# Patient Record
Sex: Female | Born: 1950
Health system: Southern US, Community
[De-identification: ages and names within clinical notes are randomized; demographics above are authoritative.]

## PROBLEM LIST (undated history)

## (undated) DIAGNOSIS — E119 Type 2 diabetes mellitus without complications: Secondary | ICD-10-CM

## (undated) DIAGNOSIS — E785 Hyperlipidemia, unspecified: Secondary | ICD-10-CM

## (undated) DIAGNOSIS — Z923 Personal history of irradiation: Secondary | ICD-10-CM

## (undated) DIAGNOSIS — Z803 Family history of malignant neoplasm of breast: Secondary | ICD-10-CM

## (undated) DIAGNOSIS — T7840XA Allergy, unspecified, initial encounter: Secondary | ICD-10-CM

## (undated) DIAGNOSIS — C50919 Malignant neoplasm of unspecified site of unspecified female breast: Secondary | ICD-10-CM

## (undated) HISTORY — DX: Type 2 diabetes mellitus without complications: E11.9

## (undated) HISTORY — PX: BREAST BIOPSY: SHX20

## (undated) HISTORY — DX: Allergy, unspecified, initial encounter: T78.40XA

## (undated) HISTORY — DX: Family history of malignant neoplasm of breast: Z80.3

## (undated) HISTORY — DX: Hyperlipidemia, unspecified: E78.5

## (undated) HISTORY — PX: OOPHORECTOMY: SHX86

## (undated) HISTORY — DX: Malignant neoplasm of unspecified site of unspecified female breast: C50.919

## (undated) HISTORY — PX: TONSILLECTOMY: SUR1361

## (undated) HISTORY — PX: FOOT SURGERY: SHX648

---

## 1998-07-13 ENCOUNTER — Ambulatory Visit (HOSPITAL_COMMUNITY): Admission: RE | Admit: 1998-07-13 | Discharge: 1998-07-13 | Payer: Self-pay | Admitting: Obstetrics and Gynecology

## 1998-07-15 ENCOUNTER — Other Ambulatory Visit: Admission: RE | Admit: 1998-07-15 | Discharge: 1998-07-15 | Payer: Self-pay | Admitting: Obstetrics and Gynecology

## 1999-08-01 ENCOUNTER — Other Ambulatory Visit: Admission: RE | Admit: 1999-08-01 | Discharge: 1999-08-01 | Payer: Self-pay | Admitting: Obstetrics and Gynecology

## 2000-07-31 ENCOUNTER — Other Ambulatory Visit: Admission: RE | Admit: 2000-07-31 | Discharge: 2000-07-31 | Payer: Self-pay | Admitting: Obstetrics and Gynecology

## 2001-07-31 ENCOUNTER — Other Ambulatory Visit: Admission: RE | Admit: 2001-07-31 | Discharge: 2001-07-31 | Payer: Self-pay | Admitting: Obstetrics & Gynecology

## 2002-07-21 ENCOUNTER — Ambulatory Visit (HOSPITAL_COMMUNITY): Admission: RE | Admit: 2002-07-21 | Discharge: 2002-07-21 | Payer: Self-pay | Admitting: Obstetrics and Gynecology

## 2002-07-21 ENCOUNTER — Other Ambulatory Visit: Admission: RE | Admit: 2002-07-21 | Discharge: 2002-07-21 | Payer: Self-pay | Admitting: Obstetrics and Gynecology

## 2002-07-21 ENCOUNTER — Encounter: Payer: Self-pay | Admitting: Obstetrics and Gynecology

## 2003-08-03 ENCOUNTER — Other Ambulatory Visit: Admission: RE | Admit: 2003-08-03 | Discharge: 2003-08-03 | Payer: Self-pay | Admitting: Obstetrics and Gynecology

## 2004-08-03 ENCOUNTER — Other Ambulatory Visit: Admission: RE | Admit: 2004-08-03 | Discharge: 2004-08-03 | Payer: Self-pay | Admitting: Obstetrics and Gynecology

## 2005-08-03 ENCOUNTER — Other Ambulatory Visit: Admission: RE | Admit: 2005-08-03 | Discharge: 2005-08-03 | Payer: Self-pay | Admitting: Obstetrics and Gynecology

## 2006-08-20 ENCOUNTER — Ambulatory Visit: Payer: Self-pay | Admitting: Family Medicine

## 2006-11-03 ENCOUNTER — Encounter: Payer: Self-pay | Admitting: Family Medicine

## 2006-11-03 LAB — CONVERTED CEMR LAB

## 2006-11-15 ENCOUNTER — Ambulatory Visit: Payer: Self-pay | Admitting: Family Medicine

## 2006-11-19 ENCOUNTER — Ambulatory Visit: Payer: Self-pay | Admitting: Family Medicine

## 2006-11-19 ENCOUNTER — Other Ambulatory Visit: Admission: RE | Admit: 2006-11-19 | Discharge: 2006-11-19 | Payer: Self-pay | Admitting: Family Medicine

## 2006-11-19 ENCOUNTER — Encounter (INDEPENDENT_AMBULATORY_CARE_PROVIDER_SITE_OTHER): Payer: Self-pay | Admitting: *Deleted

## 2006-12-04 DIAGNOSIS — C50919 Malignant neoplasm of unspecified site of unspecified female breast: Secondary | ICD-10-CM

## 2006-12-04 DIAGNOSIS — Z923 Personal history of irradiation: Secondary | ICD-10-CM

## 2006-12-04 HISTORY — PX: BREAST LUMPECTOMY: SHX2

## 2006-12-04 HISTORY — DX: Personal history of irradiation: Z92.3

## 2006-12-04 HISTORY — DX: Malignant neoplasm of unspecified site of unspecified female breast: C50.919

## 2007-08-08 ENCOUNTER — Encounter: Payer: Self-pay | Admitting: Family Medicine

## 2007-08-08 DIAGNOSIS — E669 Obesity, unspecified: Secondary | ICD-10-CM | POA: Insufficient documentation

## 2007-08-08 DIAGNOSIS — J309 Allergic rhinitis, unspecified: Secondary | ICD-10-CM | POA: Insufficient documentation

## 2007-08-08 DIAGNOSIS — E1169 Type 2 diabetes mellitus with other specified complication: Secondary | ICD-10-CM | POA: Insufficient documentation

## 2007-08-08 DIAGNOSIS — E785 Hyperlipidemia, unspecified: Secondary | ICD-10-CM | POA: Insufficient documentation

## 2007-08-08 DIAGNOSIS — E6609 Other obesity due to excess calories: Secondary | ICD-10-CM | POA: Insufficient documentation

## 2007-08-19 ENCOUNTER — Encounter: Payer: Self-pay | Admitting: Family Medicine

## 2007-08-19 ENCOUNTER — Ambulatory Visit: Payer: Self-pay | Admitting: Family Medicine

## 2007-08-19 ENCOUNTER — Other Ambulatory Visit: Admission: RE | Admit: 2007-08-19 | Discharge: 2007-08-19 | Payer: Self-pay | Admitting: Family Medicine

## 2007-08-19 LAB — CONVERTED CEMR LAB: Pap Smear: NORMAL

## 2007-08-20 LAB — CONVERTED CEMR LAB
ALT: 21 units/L (ref 0–35)
AST: 22 units/L (ref 0–37)
Albumin: 3.9 g/dL (ref 3.5–5.2)
Alkaline Phosphatase: 81 units/L (ref 39–117)
BUN: 14 mg/dL (ref 6–23)
Bilirubin, Direct: 0.1 mg/dL (ref 0.0–0.3)
CO2: 30 meq/L (ref 19–32)
Calcium: 9.1 mg/dL (ref 8.4–10.5)
Chloride: 108 meq/L (ref 96–112)
Cholesterol: 210 mg/dL (ref 0–200)
Creatinine, Ser: 1 mg/dL (ref 0.4–1.2)
Direct LDL: 133.6 mg/dL
GFR calc Af Amer: 74 mL/min
GFR calc non Af Amer: 61 mL/min
Glucose, Bld: 88 mg/dL (ref 70–99)
HDL: 62.9 mg/dL (ref >39.0)
Potassium: 4.2 meq/L (ref 3.5–5.1)
Sodium: 143 meq/L (ref 135–145)
Total Bilirubin: 1 mg/dL (ref 0.3–1.2)
Total CHOL/HDL Ratio: 3.3
Total Protein: 6.6 g/dL (ref 6.0–8.3)
Triglycerides: 88 mg/dL (ref 0–149)
VLDL: 18 mg/dL (ref 0–40)

## 2007-09-02 ENCOUNTER — Encounter: Payer: Self-pay | Admitting: Family Medicine

## 2007-09-03 ENCOUNTER — Encounter (INDEPENDENT_AMBULATORY_CARE_PROVIDER_SITE_OTHER): Payer: Self-pay | Admitting: *Deleted

## 2007-09-13 ENCOUNTER — Encounter: Payer: Self-pay | Admitting: Family Medicine

## 2007-09-18 ENCOUNTER — Telehealth: Payer: Self-pay | Admitting: Family Medicine

## 2007-09-24 ENCOUNTER — Encounter: Payer: Self-pay | Admitting: Family Medicine

## 2007-09-24 ENCOUNTER — Encounter: Admission: RE | Admit: 2007-09-24 | Discharge: 2007-09-24 | Payer: Self-pay | Admitting: Family Medicine

## 2007-09-24 ENCOUNTER — Encounter (INDEPENDENT_AMBULATORY_CARE_PROVIDER_SITE_OTHER): Payer: Self-pay | Admitting: Radiology

## 2007-09-29 ENCOUNTER — Encounter: Admission: RE | Admit: 2007-09-29 | Discharge: 2007-09-29 | Payer: Self-pay | Admitting: Family Medicine

## 2007-10-01 ENCOUNTER — Encounter: Payer: Self-pay | Admitting: Family Medicine

## 2007-10-03 ENCOUNTER — Ambulatory Visit: Payer: Self-pay | Admitting: Oncology

## 2007-10-15 ENCOUNTER — Encounter: Admission: RE | Admit: 2007-10-15 | Discharge: 2007-10-15 | Payer: Self-pay | Admitting: General Surgery

## 2007-10-16 ENCOUNTER — Ambulatory Visit (HOSPITAL_BASED_OUTPATIENT_CLINIC_OR_DEPARTMENT_OTHER): Admission: RE | Admit: 2007-10-16 | Discharge: 2007-10-16 | Payer: Self-pay | Admitting: General Surgery

## 2007-10-16 ENCOUNTER — Encounter: Payer: Self-pay | Admitting: Family Medicine

## 2007-10-16 ENCOUNTER — Encounter: Admission: RE | Admit: 2007-10-16 | Discharge: 2007-10-16 | Payer: Self-pay | Admitting: General Surgery

## 2007-10-16 ENCOUNTER — Encounter (INDEPENDENT_AMBULATORY_CARE_PROVIDER_SITE_OTHER): Payer: Self-pay | Admitting: General Surgery

## 2007-11-05 ENCOUNTER — Encounter: Payer: Self-pay | Admitting: Family Medicine

## 2007-11-20 ENCOUNTER — Ambulatory Visit: Admission: RE | Admit: 2007-11-20 | Discharge: 2007-12-04 | Payer: Self-pay | Admitting: Radiation Oncology

## 2007-11-21 ENCOUNTER — Encounter: Payer: Self-pay | Admitting: Family Medicine

## 2007-11-21 ENCOUNTER — Ambulatory Visit: Payer: Self-pay | Admitting: Oncology

## 2007-11-21 LAB — CBC WITH DIFFERENTIAL/PLATELET
BASO%: 0.9 % (ref 0.0–2.0)
Basophils Absolute: 0.1 10*3/uL (ref 0.0–0.1)
EOS%: 5.9 % (ref 0.0–7.0)
Eosinophils Absolute: 0.5 10*3/uL (ref 0.0–0.5)
HCT: 42.3 % (ref 34.8–46.6)
HGB: 14.9 g/dL (ref 11.6–15.9)
LYMPH%: 31.8 % (ref 14.0–48.0)
MCH: 31.8 pg (ref 26.0–34.0)
MCHC: 35.3 g/dL (ref 32.0–36.0)
MCV: 89.9 fL (ref 81.0–101.0)
MONO#: 0.6 10*3/uL (ref 0.1–0.9)
MONO%: 7.4 % (ref 0.0–13.0)
NEUT#: 4.5 10*3/uL (ref 1.5–6.5)
NEUT%: 54.1 % (ref 39.6–76.8)
Platelets: 391 10*3/uL (ref 145–400)
RBC: 4.7 10*6/uL (ref 3.70–5.32)
RDW: 10.2 % — ABNORMAL LOW (ref 11.3–14.5)
WBC: 8.3 10*3/uL (ref 3.9–10.0)
lymph#: 2.6 10*3/uL (ref 0.9–3.3)

## 2007-11-21 LAB — COMPREHENSIVE METABOLIC PANEL
ALT: 14 U/L (ref 0–35)
AST: 16 U/L (ref 0–37)
Albumin: 4.3 g/dL (ref 3.5–5.2)
Alkaline Phosphatase: 92 U/L (ref 39–117)
BUN: 22 mg/dL (ref 6–23)
CO2: 30 mEq/L (ref 19–32)
Calcium: 9.5 mg/dL (ref 8.4–10.5)
Chloride: 102 mEq/L (ref 96–112)
Creatinine, Ser: 0.92 mg/dL (ref 0.40–1.20)
Glucose, Bld: 92 mg/dL (ref 70–99)
Potassium: 4.2 mEq/L (ref 3.5–5.3)
Sodium: 141 mEq/L (ref 135–145)
Total Bilirubin: 0.4 mg/dL (ref 0.3–1.2)
Total Protein: 7.1 g/dL (ref 6.0–8.3)

## 2007-11-26 LAB — VITAMIN D PNL(25-HYDRXY+1,25-DIHY)-BLD
Vit D, 1,25-Dihydroxy: 57 pg/mL (ref 6–62)
Vit D, 25-Hydroxy: 53 ng/mL (ref 30–89)

## 2007-12-05 ENCOUNTER — Ambulatory Visit: Admission: RE | Admit: 2007-12-05 | Discharge: 2008-02-04 | Payer: Self-pay | Admitting: Radiation Oncology

## 2007-12-05 HISTORY — PX: OTHER SURGICAL HISTORY: SHX169

## 2007-12-17 ENCOUNTER — Encounter: Payer: Self-pay | Admitting: Family Medicine

## 2007-12-18 ENCOUNTER — Telehealth: Payer: Self-pay | Admitting: Family Medicine

## 2007-12-20 ENCOUNTER — Ambulatory Visit: Payer: Self-pay | Admitting: Family Medicine

## 2008-01-17 ENCOUNTER — Encounter: Payer: Self-pay | Admitting: Family Medicine

## 2008-01-21 ENCOUNTER — Encounter (INDEPENDENT_AMBULATORY_CARE_PROVIDER_SITE_OTHER): Payer: Self-pay | Admitting: Obstetrics and Gynecology

## 2008-01-21 ENCOUNTER — Encounter: Payer: Self-pay | Admitting: Family Medicine

## 2008-01-21 ENCOUNTER — Ambulatory Visit (HOSPITAL_COMMUNITY): Admission: RE | Admit: 2008-01-21 | Discharge: 2008-01-21 | Payer: Self-pay | Admitting: Obstetrics and Gynecology

## 2008-02-26 ENCOUNTER — Encounter: Payer: Self-pay | Admitting: Family Medicine

## 2008-03-11 ENCOUNTER — Ambulatory Visit: Payer: Self-pay | Admitting: Oncology

## 2008-04-22 ENCOUNTER — Encounter: Payer: Self-pay | Admitting: Family Medicine

## 2008-08-24 ENCOUNTER — Encounter: Payer: Self-pay | Admitting: Family Medicine

## 2008-08-24 ENCOUNTER — Other Ambulatory Visit: Admission: RE | Admit: 2008-08-24 | Discharge: 2008-08-24 | Payer: Self-pay | Admitting: Family Medicine

## 2008-08-24 ENCOUNTER — Ambulatory Visit: Payer: Self-pay | Admitting: Family Medicine

## 2008-08-24 LAB — CONVERTED CEMR LAB: Pap Smear: NORMAL

## 2008-08-25 LAB — CONVERTED CEMR LAB
ALT: 26 units/L (ref 0–35)
AST: 26 units/L (ref 0–37)
Albumin: 3.6 g/dL (ref 3.5–5.2)
Alkaline Phosphatase: 49 units/L (ref 39–117)
BUN: 13 mg/dL (ref 6–23)
Bilirubin, Direct: 0.1 mg/dL (ref 0.0–0.3)
CO2: 28 meq/L (ref 19–32)
Calcium: 8.6 mg/dL (ref 8.4–10.5)
Chloride: 108 meq/L (ref 96–112)
Cholesterol: 189 mg/dL (ref 0–200)
Creatinine, Ser: 0.9 mg/dL (ref 0.4–1.2)
GFR calc Af Amer: 83 mL/min
GFR calc non Af Amer: 69 mL/min
Glucose, Bld: 82 mg/dL (ref 70–99)
HDL: 65.1 mg/dL (ref >39.0)
LDL Cholesterol: 98 mg/dL (ref 0–99)
Potassium: 4 meq/L (ref 3.5–5.1)
Sodium: 143 meq/L (ref 135–145)
Total Bilirubin: 0.9 mg/dL (ref 0.3–1.2)
Total CHOL/HDL Ratio: 2.9
Total Protein: 6.5 g/dL (ref 6.0–8.3)
Triglycerides: 129 mg/dL (ref 0–149)
VLDL: 26 mg/dL (ref 0–40)

## 2008-09-01 ENCOUNTER — Encounter: Admission: RE | Admit: 2008-09-01 | Discharge: 2008-09-01 | Payer: Self-pay | Admitting: General Surgery

## 2008-10-23 ENCOUNTER — Encounter: Payer: Self-pay | Admitting: Family Medicine

## 2008-10-27 ENCOUNTER — Encounter: Admission: RE | Admit: 2008-10-27 | Discharge: 2008-10-27 | Payer: Self-pay | Admitting: General Surgery

## 2009-03-25 ENCOUNTER — Ambulatory Visit: Payer: Self-pay | Admitting: Oncology

## 2009-03-29 ENCOUNTER — Encounter: Payer: Self-pay | Admitting: Family Medicine

## 2009-04-12 LAB — CBC WITH DIFFERENTIAL/PLATELET
BASO%: 0.4 % (ref 0.0–2.0)
Basophils Absolute: 0 10*3/uL (ref 0.0–0.1)
EOS%: 3 % (ref 0.0–7.0)
Eosinophils Absolute: 0.2 10*3/uL (ref 0.0–0.5)
HCT: 40.9 % (ref 34.8–46.6)
HGB: 14.1 g/dL (ref 11.6–15.9)
LYMPH%: 31.3 % (ref 14.0–49.7)
MCH: 32 pg (ref 25.1–34.0)
MCHC: 34.4 g/dL (ref 31.5–36.0)
MCV: 93 fL (ref 79.5–101.0)
MONO#: 0.5 10*3/uL (ref 0.1–0.9)
MONO%: 8.3 % (ref 0.0–14.0)
NEUT#: 3.6 10*3/uL (ref 1.5–6.5)
NEUT%: 57 % (ref 38.4–76.8)
Platelets: 251 10*3/uL (ref 145–400)
RBC: 4.4 10*6/uL (ref 3.70–5.45)
RDW: 12.7 % (ref 11.2–14.5)
WBC: 6.3 10*3/uL (ref 3.9–10.3)
lymph#: 2 10*3/uL (ref 0.9–3.3)

## 2009-04-12 LAB — COMPREHENSIVE METABOLIC PANEL
ALT: 54 U/L — ABNORMAL HIGH (ref 0–35)
AST: 49 U/L — ABNORMAL HIGH (ref 0–37)
Albumin: 3.7 g/dL (ref 3.5–5.2)
Alkaline Phosphatase: 54 U/L (ref 39–117)
BUN: 16 mg/dL (ref 6–23)
CO2: 28 mEq/L (ref 19–32)
Calcium: 8.8 mg/dL (ref 8.4–10.5)
Chloride: 102 mEq/L (ref 96–112)
Creatinine, Ser: 1.01 mg/dL (ref 0.40–1.20)
Glucose, Bld: 173 mg/dL — ABNORMAL HIGH (ref 70–99)
Potassium: 4.2 mEq/L (ref 3.5–5.3)
Sodium: 138 mEq/L (ref 135–145)
Total Bilirubin: 0.9 mg/dL (ref 0.3–1.2)
Total Protein: 7 g/dL (ref 6.0–8.3)

## 2009-04-12 LAB — RESEARCH LABS

## 2009-08-23 ENCOUNTER — Ambulatory Visit: Payer: Self-pay | Admitting: Oncology

## 2009-08-25 LAB — RESEARCH LABS

## 2009-09-21 ENCOUNTER — Encounter: Admission: RE | Admit: 2009-09-21 | Discharge: 2009-09-21 | Payer: Self-pay | Admitting: General Surgery

## 2009-09-21 ENCOUNTER — Encounter (INDEPENDENT_AMBULATORY_CARE_PROVIDER_SITE_OTHER): Payer: Self-pay | Admitting: *Deleted

## 2009-09-21 ENCOUNTER — Ambulatory Visit: Payer: Self-pay | Admitting: Family Medicine

## 2009-09-22 ENCOUNTER — Encounter (INDEPENDENT_AMBULATORY_CARE_PROVIDER_SITE_OTHER): Payer: Self-pay | Admitting: *Deleted

## 2009-10-05 ENCOUNTER — Encounter: Payer: Self-pay | Admitting: Family Medicine

## 2009-10-14 ENCOUNTER — Encounter: Payer: Self-pay | Admitting: Family Medicine

## 2009-10-17 ENCOUNTER — Encounter: Admission: RE | Admit: 2009-10-17 | Discharge: 2009-10-17 | Payer: Self-pay | Admitting: General Surgery

## 2009-10-21 LAB — HM COLONOSCOPY: HM Colonoscopy: NORMAL

## 2010-03-02 ENCOUNTER — Ambulatory Visit: Payer: Self-pay | Admitting: Oncology

## 2010-03-08 ENCOUNTER — Encounter: Payer: Self-pay | Admitting: Family Medicine

## 2010-03-08 LAB — CMP (CANCER CENTER ONLY)
ALT(SGPT): 28 U/L (ref 10–47)
AST: 27 U/L (ref 11–38)
Albumin: 3.4 g/dL (ref 3.3–5.5)
Alkaline Phosphatase: 69 U/L (ref 26–84)
BUN, Bld: 16 mg/dL (ref 7–22)
CO2: 30 mEq/L (ref 18–33)
Calcium: 9.1 mg/dL (ref 8.0–10.3)
Chloride: 102 mEq/L (ref 98–108)
Creat: 0.8 mg/dl (ref 0.6–1.2)
Glucose, Bld: 162 mg/dL — ABNORMAL HIGH (ref 73–118)
Potassium: 4.2 mEq/L (ref 3.3–4.7)
Sodium: 137 mEq/L (ref 128–145)
Total Bilirubin: 0.5 mg/dl (ref 0.20–1.60)
Total Protein: 6.6 g/dL (ref 6.4–8.1)

## 2010-03-08 LAB — CBC WITH DIFFERENTIAL (CANCER CENTER ONLY)
BASO#: 0.1 10*3/uL (ref 0.0–0.2)
BASO%: 0.9 % (ref 0.0–2.0)
EOS%: 3.9 % (ref 0.0–7.0)
Eosinophils Absolute: 0.2 10*3/uL (ref 0.0–0.5)
HCT: 39.2 % (ref 34.8–46.6)
HGB: 13.5 g/dL (ref 11.6–15.9)
LYMPH#: 2.2 10*3/uL (ref 0.9–3.3)
LYMPH%: 36.1 % (ref 14.0–48.0)
MCH: 31.5 pg (ref 26.0–34.0)
MCHC: 34.6 g/dL (ref 32.0–36.0)
MCV: 91 fL (ref 81–101)
MONO#: 0.3 10*3/uL (ref 0.1–0.9)
MONO%: 5.7 % (ref 0.0–13.0)
NEUT#: 3.2 10*3/uL (ref 1.5–6.5)
NEUT%: 53.4 % (ref 39.6–80.0)
Platelets: 239 10*3/uL (ref 145–400)
RBC: 4.29 10*6/uL (ref 3.70–5.32)
RDW: 11.6 % (ref 10.5–14.6)
WBC: 6 10*3/uL (ref 3.9–10.0)

## 2010-09-13 ENCOUNTER — Ambulatory Visit: Payer: Self-pay | Admitting: Family Medicine

## 2010-09-13 LAB — CONVERTED CEMR LAB

## 2010-09-13 LAB — HM PAP SMEAR

## 2010-09-14 LAB — CONVERTED CEMR LAB
ALT: 42 units/L — ABNORMAL HIGH (ref 0–35)
AST: 38 units/L — ABNORMAL HIGH (ref 0–37)
Albumin: 3.6 g/dL (ref 3.5–5.2)
Alkaline Phosphatase: 54 units/L (ref 39–117)
BUN: 18 mg/dL (ref 6–23)
Bilirubin, Direct: 0.1 mg/dL (ref 0.0–0.3)
CO2: 26 meq/L (ref 19–32)
Calcium: 8.7 mg/dL (ref 8.4–10.5)
Chloride: 103 meq/L (ref 96–112)
Cholesterol: 231 mg/dL — ABNORMAL HIGH (ref 0–200)
Creatinine, Ser: 1 mg/dL (ref 0.4–1.2)
Direct LDL: 144.6 mg/dL
GFR calc non Af Amer: 59.11 mL/min (ref >60)
Glucose, Bld: 89 mg/dL (ref 70–99)
HDL: 65.8 mg/dL (ref >39.00)
Potassium: 4 meq/L (ref 3.5–5.1)
Sodium: 139 meq/L (ref 135–145)
Total Bilirubin: 0.6 mg/dL (ref 0.3–1.2)
Total CHOL/HDL Ratio: 4
Total Protein: 6.3 g/dL (ref 6.0–8.3)
Triglycerides: 142 mg/dL (ref 0.0–149.0)
VLDL: 28.4 mg/dL (ref 0.0–40.0)

## 2010-09-22 ENCOUNTER — Encounter: Admission: RE | Admit: 2010-09-22 | Discharge: 2010-09-22 | Payer: Self-pay | Admitting: Family Medicine

## 2010-09-22 LAB — HM MAMMOGRAPHY

## 2010-10-25 ENCOUNTER — Encounter: Payer: Self-pay | Admitting: Family Medicine

## 2010-12-15 ENCOUNTER — Other Ambulatory Visit: Payer: Self-pay | Admitting: Family Medicine

## 2010-12-15 ENCOUNTER — Ambulatory Visit
Admission: RE | Admit: 2010-12-15 | Discharge: 2010-12-15 | Payer: Self-pay | Source: Home / Self Care | Attending: Family Medicine | Admitting: Family Medicine

## 2010-12-15 LAB — LIPID PANEL
Cholesterol: 223 mg/dL — ABNORMAL HIGH (ref 0–200)
HDL: 61 mg/dL (ref >39.00)
Total CHOL/HDL Ratio: 4
Triglycerides: 131 mg/dL (ref 0.0–149.0)
VLDL: 26.2 mg/dL (ref 0.0–40.0)

## 2010-12-15 LAB — LDL CHOLESTEROL, DIRECT: Direct LDL: 145.6 mg/dL

## 2010-12-15 LAB — ALT: ALT: 34 U/L (ref 0–35)

## 2010-12-15 LAB — AST: AST: 33 U/L (ref 0–37)

## 2011-01-01 LAB — CONVERTED CEMR LAB
ALT: 22 units/L (ref 0–35)
AST: 23 units/L (ref 0–37)
Albumin: 3.7 g/dL (ref 3.5–5.2)
Alkaline Phosphatase: 55 units/L (ref 39–117)
BUN: 14 mg/dL (ref 6–23)
Bilirubin, Direct: 0 mg/dL (ref 0.0–0.3)
CO2: 29 meq/L (ref 19–32)
Calcium: 8.7 mg/dL (ref 8.4–10.5)
Chloride: 105 meq/L (ref 96–112)
Cholesterol: 207 mg/dL — ABNORMAL HIGH (ref 0–200)
Creatinine, Ser: 0.9 mg/dL (ref 0.4–1.2)
Direct LDL: 125.6 mg/dL
GFR calc non Af Amer: 68.53 mL/min (ref >60)
Glucose, Bld: 81 mg/dL (ref 70–99)
HDL: 64.1 mg/dL (ref >39.00)
Potassium: 4.2 meq/L (ref 3.5–5.1)
Sodium: 143 meq/L (ref 135–145)
Total Bilirubin: 0.8 mg/dL (ref 0.3–1.2)
Total CHOL/HDL Ratio: 3
Total Protein: 6.7 g/dL (ref 6.0–8.3)
Triglycerides: 129 mg/dL (ref 0.0–149.0)
VLDL: 25.8 mg/dL (ref 0.0–40.0)

## 2011-01-03 NOTE — Letter (Signed)
Summary: [facility]   Imported By: Lester Elkhart 11/05/2010 09:30:50  _____________________________________________________________________  External Attachment:    Type:   Image     Comment:   External Document

## 2011-01-03 NOTE — Letter (Signed)
Summary: [facility]   Imported By: Lanelle Bal 10/12/2009 12:33:28  _____________________________________________________________________  External Attachment:    Type:   Image     Comment:   External Document

## 2011-01-03 NOTE — Consult Note (Signed)
Summary: [facility]/Consultation Report/Dr. [facility]/Consultation Report/Dr. Derrell Lolling   Imported By: Mickle Asper 12/20/2007 10:33:41  _____________________________________________________________________  External Attachment:    Type:   Image     Comment:   External Document

## 2011-01-03 NOTE — Letter (Signed)
Summary: [facility]/Dr. Magrinat  [facility]/Dr. Magrinat   Imported By: Eleonore Chiquito 12/31/2007 10:48:49  _____________________________________________________________________  External Attachment:    Type:   Image     Comment:   External Document

## 2011-01-03 NOTE — Assessment & Plan Note (Signed)
Summary: CPX/[facility]   Vital Signs:  Patient Profile:   60 Years Old Female Height:     65.75 inches (168.28 cm) Weight:      161 pounds Temp:     98.2 degrees F oral Pulse rate:   72 / minute Pulse rhythm:   regular BP sitting:   140 / 84  (left arm) Cuff size:   regular  Vitals Entered By: Lowella Petties (August 24, 2008 11:43 AM)                 Chief Complaint:  CPX.  History of Present Illness: Has history of breast cancer, has mammogram scheduled  Last eval with onc/surgery cancer free  04/2008  At home BPs have been running 108/60-112/70 as well as at other MD visits    Current Allergies: No known allergies   Past Medical History:    Reviewed history from 08/08/2007 and no changes required:       Allergic rhinitis       Hyperlipidemia  Past Surgical History:    Reviewed history from 01/29/2008 and no changes required:       2005 Foot surgery x 4       1959 Tonsillectomy       2004 DEXA - 0.03       2006 DEXA 0.5       10/2007 L partial  mastectomy for low grade ductal carcinoma insitu       2009 B oopherectomy, cervix and uterus present       06/2008 foot surgery,  fibroma removal    Social History:    Reviewed history from 08/08/2007 and no changes required:       Never Smoked       Alcohol use-yes, glass of wine q. 6 months       Drug use-no       Regular exercise-yes, walks 45 minutes q. 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  CV      Denies chest pain or discomfort.  Resp      Denies shortness of breath.  GI      Denies abdominal pain and bloody stools.  GU      Denies abnormal vaginal bleeding and dysuria.  MS      Denies joint pain, joint redness, and joint swelling.  Derm      Denies lesion(s).  Psych  Denies anxiety and depression.   Physical Exam  General:     Well-developed,well-nourished,in no acute distress; alert,appropriate and cooperative throughout examination Eyes:     No corneal or conjunctival inflammation noted. EOMI. Perrla. Funduscopic exam benign, without hemorrhages, exudates or papilledema. Vision grossly normal. Ears:     External ear exam shows no significant lesions or deformities.  Otoscopic examination reveals clear canals, tympanic membranes are intact bilaterally without bulging, retraction, inflammation or discharge. Hearing is grossly normal bilaterally. Nose:     External nasal examination shows no deformity or inflammation. Nasal mucosa are pink and moist without lesions or exudates. Mouth:     Oral mucosa and oropharynx without lesions or exudates.  Teeth in good repair. Neck:  No deformities, masses, or tenderness noted. Breasts:     deferred Lungs:     Normal respiratory effort, chest expands symmetrically. Lungs are clear to auscultation, no crackles or wheezes. Heart:     Normal rate and regular rhythm. S1 and S2 normal without gallop, murmur, click, rub or other extra sounds. Abdomen:     Bowel sounds positive,abdomen soft and non-tender without masses, organomegaly or hernias noted. Genitalia:     Pelvic Exam:        External: normal female genitalia without lesions or masses        Vagina: normal without lesions or masses        Cervix: normal without lesions or masses        Adnexa: normal bimanual exam without masses or fullness        Uterus: normal by palpation        Pap smear: performed Pulses:     R and Lposterior tibial pulses are full and equal bilaterally Extremities:     no edema Neurologic:     No cranial nerve deficits noted. Station and gait are normal. Plantar reflexes are down-going bilaterally. DTRs are symmetrical throughout. Sensory, motor and coordinative functions appear intact. Skin:     Intact without suspicious  lesions or rashes Cervical Nodes:     no cervical or supraclavicular lymphadenopathy  Psych:     Cognition and judgment appear intact. Alert and cooperative with normal attention span and concentration. No apparent delusions, illusions, hallucinations    Impression & Recommendations:  Problem # 1:  WELL WOMAN (ICD-V70.0) Reviewed preventive care protocols, scheduled due services, and updated immunizations. Encouraged exercise, weight loss, healthy eating habits. Check cholesterol and DM screen. (Does not want lipitor if needs medication.)  Problem # 2:  GYNECOLOGICAL EXAMINATION, ROUTINE (ICD-V72.31) PAP pending.   Problem # 3:  CA IN SITU, BREAST, DUCTAL (ICD-233.0) stable, in remission  Complete Medication List: 1)  Adult Aspirin Low Strength 81 Mg Tbdp (Aspirin) .Marland Kitchen.. 1 by mouth once daily 2)  Fish Oil 500 Mg Caps (Omega-3 fatty acids) .... 2 by mouth once daily 3)  Bl Flax Seed Oil 1000 Mg Caps (Flaxseed (linseed)) .... Two times a day 4)  Calcium-magnesium 500-250 Mg Tabs (Calcium-magnesium) .... 2 by mouth once daily 5)  Red Yeast Rice 600 Mg Caps (Red yeast rice extract) .... Once daily 6)  Multivitamins Tabs (Multiple vitamin) .... Once daily 7)  Fiber Therapy 0.52 Gm Caps (Psyllium) .Marland Kitchen.. 1 by mouth two times a day 8)  Vitamin D 400 Unit Caps (Cholecalciferol) .... Once daily 9)  Zyrtec Allergy 10 Mg Tabs (Cetirizine hcl) .... Take one by mouth daily 10)  Tamoxifen Citrate 10 Mg Tabs (Tamoxifen citrate) .... Take one by mouth daily  Other Orders: Flu Vaccine 28yrs + ([address]) Admin 1st Vaccine ([address]) TLB-BMP (Basic Metabolic Panel-BMET) (80048-METABOL) TLB-Hepatic/Liver Function Pnl (80076-HEPATIC) TLB-Lipid Panel (80061-LIPID)    ] Prior Medications (reviewed today): ADULT ASPIRIN LOW STRENGTH 81 MG  TBDP (ASPIRIN) 1 by mouth once daily FISH OIL 500 MG  CAPS (OMEGA-3 FATTY ACIDS) 2 by mouth once daily BL FLAX SEED OIL 1000 MG  CAPS (FLAXSEED (LINSEED)) two times a  day CALCIUM-MAGNESIUM 500-250 MG  TABS (CALCIUM-MAGNESIUM) 2 by mouth once daily RED YEAST RICE 600 MG  CAPS (RED YEAST RICE EXTRACT) once daily MULTIVITAMINS   TABS (MULTIPLE VITAMIN) once daily FIBER THERAPY 0.52 GM  CAPS (PSYLLIUM) 1 by mouth two times a day VITAMIN D 400 UNIT  CAPS (CHOLECALCIFEROL)  once daily ZYRTEC ALLERGY 10 MG TABS (CETIRIZINE HCL) take one by mouth daily TAMOXIFEN CITRATE 10 MG TABS (TAMOXIFEN CITRATE) take one by mouth daily Current Allergies: No known allergies    Influenza Vaccine (to be given today)      Flex Sig Next Due:  Not Indicated Last Colonoscopy:  Done (08/04/2005 11:03:51 AM) Colonoscopy Next Due:  10 yr   Influenza Vaccine    Vaccine Type: Fluvax 3+    Site: left deltoid    Mfr: GlaxoSmithKline    Dose: 0.5 ml    Route: IM    Given by: Lowella Petties    Exp. Date: 06/02/2009    Lot #: [id number]    VIS given: 06/27/07 version given August 24, 2008.  Flu Vaccine Consent Questions    Do you have a history of severe allergic reactions to this vaccine? no    Any prior history of allergic reactions to egg and/or gelatin? no    Do you have a sensitivity to the preservative Thimersol? no    Do you have a past history of Guillan-Barre Syndrome? no    Do you currently have an acute febrile illness? no    Have you ever had a severe reaction to latex? no    Vaccine information given and explained to patient? yes    Are you currently pregnant? no

## 2011-01-03 NOTE — Letter (Signed)
Summary: Results Follow up Letter  [place] [facility]  [address] [place], [place] [address]   Phone: [phone]  Fax: [phone]    09/21/2009 MRN: [id number]     Bridget Cummings [address] [place], [place]  [address]    Dear [name],  The following are the results of your recent test(s):  Test         Result    Pap Smear:        Normal _____  Not Normal _____ Comments: ______________________________________________________ Cholesterol: LDL(Bad cholesterol):         Your goal is less than:         HDL (Good cholesterol):       Your goal is more than: Comments:  ______________________________________________________ Mammogram:        Normal ___x__  Not Normal _____ Comments:Repeat in 1 year  ___________________________________________________________________ Hemoccult:        Normal _____  Not normal _______ Comments:    _____________________________________________________________________ Other Tests:    We routinely do not discuss normal results over the telephone.  If you desire a copy of the results, or you have any questions about this information we can discuss them at your next office visit.   Sincerely,    Kerby Nora MD

## 2011-01-03 NOTE — Op Note (Signed)
Summary: [facility]/Post-Op Visit/Dr. [facility]/Post-Op Visit/Dr. Derrell Lolling   Imported By: Mickle Asper 11/14/2007 10:49:24  _____________________________________________________________________  External Attachment:    Type:   Image     Comment:   External Document

## 2011-01-03 NOTE — Letter (Signed)
[facility]   Imported By: Maryln Gottron 03/14/2010 15:41:01  _____________________________________________________________________  External Attachment:    Type:   Image     Comment:   External Document

## 2011-01-03 NOTE — Consult Note (Signed)
Summary: [facility]/Dr. Vella [facility]ler [facility]/Dr. Dayton Scrape   Imported By: Eleonore Chiquito 03/10/2008 16:30:02  _____________________________________________________________________  External Attachment:    Type:   Image     Comment:   External Document

## 2011-01-03 NOTE — Consult Note (Signed)
Summary: [facility]/Dr.Murray  [facility]/Dr.Murray   Imported By: Eleonore Chiquito 01/22/2008 13:34:04  _____________________________________________________________________  External Attachment:    Type:   Image     Comment:   External Document

## 2011-01-03 NOTE — Assessment & Plan Note (Signed)
Summary: CPX/[facility]   Vital Signs:  Patient Profile:   60 Years Old Female Height:     66.25 inches (168.28 cm) Weight:      150.25 pounds Temp:     98.3 degrees F oral Pulse rate:   64 / minute Pulse rhythm:   regular BP sitting:   110 / 80  (left arm) Cuff size:   regular  Vitals Entered ByMarland Kitchen Delilah Shan (August 19, 2007 11:14 AM)                 Chief Complaint:  CPX.  History of Present Illness: no vaginal d/c, no STD concern no breast concerns  no acute concerns recently returned from trip to [place] getting regular exercise, healthy eating   Current Allergies (reviewed today): No known allergies   Past Medical History:    Reviewed history from 08/08/2007 and no changes required:       Allergic rhinitis       Hyperlipidemia  Past Surgical History:    Reviewed history from 08/08/2007 and no changes required:       2005 Foot surgery x 4       1959 Tonsillectomy       2004 DEXA - 0.03       2006 DEXA 0.5   Family History:    Reviewed history from 08/08/2007 and no changes required:       Father: Died 82, kidney failure, Type I DM       Mother: Alive 53. breast CA, HTN       Siblings: 3 brothers DM, 1 sister died at age 33 of breast CA       No MI < 85       DM:  (+)       Breast CA: Mother and Sister           Review of Systems  The patient denies fever, chest pain, syncope, dyspnea on exhertion, peripheral edema, prolonged cough, hemoptysis, abdominal pain, melena, hematochezia, severe indigestion/heartburn, and hematuria.     Physical Exam  General:     Well-developed,well-nourished,in no acute distress; alert,appropriate and cooperative throughout examination Head:     Normocephalic and atraumatic without obvious abnormalities. No apparent alopecia or balding. Eyes:     No corneal or conjunctival inflammation noted. EOMI. Perrla.  Vision grossly normal. Ears:     External ear exam shows no significant lesions or deformities.  Otoscopic  examination reveals clear canals, tympanic membranes are intact bilaterally without bulging, retraction, inflammation or discharge. Hearing is grossly normal bilaterally. Nose:     External nasal examination shows no deformity or inflammation. Nasal mucosa are pink and moist without lesions or exudates. Mouth:     Oral mucosa and oropharynx without lesions or exudates.  Teeth in good repair. Neck:     No deformities, masses, or tenderness noted. Lungs:     Normal respiratory effort, chest expands symmetrically. Lungs are clear to auscultation, no crackles or wheezes. Heart:     Normal rate and regular rhythm. S1 and S2 normal without gallop, murmur, click, rub or other extra sounds. Abdomen:     Bowel sounds positive,abdomen soft and non-tender without masses, organomegaly or hernias noted. Genitalia:     Pelvic Exam:        External: normal female genitalia without lesions or masses        Vagina: normal without lesions or masses        Cervix: normal without lesions or masses  Adnexa: normal bimanual exam without masses or fullness        Uterus: normal by palpation        Pap smear: performed Msk:     No deformity or scoliosis noted of thoracic or lumbar spine.   Pulses:     R and Lposterior tibial pulses are full and equal bilaterally Neurologic:     No cranial nerve deficits noted. Station and gait are normal. Plantar reflexes are down-going bilaterally. DTRs are symmetrical throughout. Sensory, motor and coordinative functions appear intact. Skin:     excessive tan and solar damage.   Cervical Nodes:     No lymphadenopathy noted Psych:     Cognition and judgment appear intact. Alert and cooperative with normal attention span and concentration. No apparent delusions, illusions, hallucinations    Impression & Recommendations:  Problem # 1:  Preventive Health Care (ICD-V70.0) Due for mammogram, DXA.   Encouraged exercise and , healthy eating habits. Calcium and Vit D  recommended.  UTD with vaccines  Problem # 2:  GYNECOLOGICAL EXAMINATION, ROUTINE (ICD-V72.31)  Orders: Pap Smear (88416)   Complete Medication List: 1)  Adult Aspirin Low Strength 81 Mg Tbdp (Aspirin) .Marland Kitchen.. 1 by mouth once daily 2)  Fish Oil 500 Mg Caps (Omega-3 fatty acids) .... 2 by mouth once daily 3)  Bl Flax Seed Oil 1000 Mg Caps (Flaxseed (linseed)) .... Two times a day 4)  Calcium-magnesium 500-250 Mg Tabs (Calcium-magnesium) .... 2 by mouth once daily 5)  Red Yeast Rice 600 Mg Caps (Red yeast rice extract) .... Once daily 6)  Multivitamins Tabs (Multiple vitamin) .... Once daily 7)  Fiber Therapy 0.52 Gm Caps (Psyllium) .Marland Kitchen.. 1 by mouth two times a day 8)  Vitamin D 400 Unit Caps (Cholecalciferol) .... Once daily  Other Orders: Radiology Referral (Radiology) TLB-BMP (Basic Metabolic Panel-BMET) (80048-METABOL) TLB-Hepatic/Liver Function Pnl (80076-HEPATIC) TLB-Lipid Panel (80061-LIPID)   Patient Instructions: 1)  Referral Appointment Information  Mammogram/ DXA 2)  Day/Date: 3)  Time: 4)  Place/MD: 5)  Address: 6)  Phone/Fax: 7)  Patient given appointment information. Information/Orders faxed/mailed.     ] Current Allergies (reviewed today): No known allergies

## 2011-01-03 NOTE — Letter (Signed)
Summary: Results Follow up Letter  [place] at [facility]  [address] [place], [place] [address]   Phone: [phone]  Fax: [phone]    09/03/2007 MRN: [id number]  Bridget Cummings [address] [place], [place]  [address]  Dear Ms. Wan,  The following are the results of your recent test(s):  Test         Result    Pap Smear:        Normal _____  Not Normal _____ Comments: ______________________________________________________ Cholesterol: LDL(Bad cholesterol):         Your goal is less than:         HDL (Good cholesterol):       Your goal is more than: Comments:  ______________________________________________________ Mammogram:        Normal _____  Not Normal _____ Comments:  ___________________________________________________________________ Hemoccult:        Normal _____  Not normal _______ Comments:    _____________________________________________________________________ Other Tests:YOUR BONE DENSITY TEST WAS NORMAL. WOULD RECOMMEND YOU CONTINUE CALCIUM WITH VITAMIN D.    We routinely do not discuss normal results over the telephone.  If you desire a copy of the results, or you have any questions about this information we can discuss them at your next office visit.   Sincerely,

## 2011-01-03 NOTE — Assessment & Plan Note (Signed)
Summary: CPX/CLE   Vital Signs:  Patient profile:   60 year old female Height:      65.75 inches Weight:      152.8 pounds BMI:     24.94 Temp:     98.0 degrees F oral Pulse rate:   72 / minute Pulse rhythm:   regular BP sitting:   110 / 78  (left arm) Cuff size:   regular  Vitals Entered By: Benny Lennert CMA Duncan Dull) (September 21, 2009 11:41 AM)  History of Present Illness: Chief complaint cpx with out pap  Doing well  in last year.  Has colonoscopy 11/11.  Left nostril intermittantly tender 65month ago ... mupirocin prescribed by onc...didn't use  becasue of cost. No tenderness now. No drainage.   Breast cancer 3 years ago...on tamoxifen..for 5 years.  Scheduled for mammogram.  Had normal breast exam 1 month ago.  Plans to switch to be closer. Sees Dr. Derrell Lolling, surgeon 10/2009  Problems Prior to Update: 1)  Ca in Situ, Breast, Ductal  (ICD-233.0) 2)  Abfnd, Breast Mammographic Microcalcification  (ICD-793.81) 3)  Well Woman  (ICD-V70.0) 4)  Screening For Osteoporosis  (ICD-V82.81) 5)  Screening Mammogram Nec  (ICD-V76.12) 6)  Gynecological Examination, Routine  (ICD-V72.31) 7)  Obesity  (ICD-278.00) 8)  Hyperlipidemia  (ICD-272.4) 9)  Allergic Rhinitis  (ICD-477.9)  Current Medications (verified): 1)  Adult Aspirin Low Strength 81 Mg  Tbdp (Aspirin) .Marland Kitchen.. 1 By Mouth Once Daily 2)  Fish Oil 500 Mg  Caps (Omega-3 Fatty Acids) .... 2 By Mouth Once Daily 3)  Bl Flax Seed Oil 1000 Mg  Caps (Flaxseed (Linseed)) .... Two Times A Day 4)  Calcium-Magnesium 500-250 Mg  Tabs (Calcium-Magnesium) .... 2 By Mouth Once Daily 5)  Red Yeast Rice 600 Mg  Caps (Red Yeast Rice Extract) .... Once Daily 6)  Multivitamins   Tabs (Multiple Vitamin) .... Once Daily 7)  Fiber Therapy 0.52 Gm  Caps (Psyllium) .Marland Kitchen.. 1 By Mouth Two Times A Day 8)  Vitamin D 400 Unit  Caps (Cholecalciferol) .... Once Daily 9)  Zyrtec Allergy 10 Mg Tabs (Cetirizine Hcl) .... Take One By Mouth Daily 10)  Tamoxifen  Citrate 10 Mg Tabs (Tamoxifen Citrate) .... Take One By Mouth Daily  Allergies (verified): No Known Drug Allergies  Past History:  Past medical, surgical, family and social histories (including risk factors) reviewed, and no changes noted (except as noted below).  Past Medical History: Reviewed history from 08/08/2007 and no changes required. Allergic rhinitis Hyperlipidemia  Past Surgical History: 2005 Foot surgery x 4 1959 Tonsillectomy 2004 DEXA - 0.03 2006 DEXA 0.5 10/2007 L partial  mastectomy for low grade ductal carcinoma insitu 2009 B oopherectomy, cervix and uterus present 06/2008 foot surgery,  fibroma removal  01/2008 BSO..still has uterus, cervix.   Family History: Reviewed history from 08/08/2007 and no changes required. Father: Died 57, kidney failure, Type I DM Mother: Alive 53. breast CA, HTN Siblings: 3 brothers DM, 1 sister died at age 59 of breast CA No MI < 14 DM:  (+) Breast CA: Mother and Sister  Social History: Reviewed history from 08/08/2007 and no changes required. Never Smoked Alcohol use-yes, glass of wine q. 6 months Drug use-no Regular exercise-yes, walks 45 minutes q. 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 CV:  Denies chest pain or  discomfort. Resp:  Denies shortness of breath. GI:  Denies abdominal pain, bloody stools, constipation, and diarrhea. GU:  Denies abnormal vaginal bleeding, dysuria, and genital sores. Derm:  Complains of lesion(s). Psych:  Denies anxiety and depression.  Physical Exam  General:  Well-developed,well-nourished,in no acute distress; alert,appropriate and cooperative throughout examination Eyes:  No corneal or conjunctival inflammation noted. EOMI. Perrla. Funduscopic exam benign, without hemorrhages, exudates or papilledema.  Vision grossly normal. Ears:  External ear exam shows no significant lesions or deformities.  Otoscopic examination reveals clear canals, tympanic membranes are intact bilaterally without bulging, retraction, inflammation or discharge. Hearing is grossly normal bilaterally. Nose:  External nasal examination shows no deformity or inflammation. Nasal mucosa are pink and moist without lesions or exudates. Mouth:  Oral mucosa and oropharynx without lesions or exudates.  Teeth in good repair. Neck:  no cervical or supraclavicular lymphadenopathy no carotid bruit or thyromegaly  Lungs:  Normal respiratory effort, chest expands symmetrically. Lungs are clear to auscultation, no crackles or wheezes. Heart:  Normal rate and regular rhythm. S1 and S2 normal without gallop, murmur, click, rub or other extra sounds. Abdomen:  Bowel sounds positive,abdomen soft and non-tender without masses, organomegaly or hernias noted. Pulses:  R and Lposterior tibial pulses are full and equal bilaterally Extremities:  no edema Neurologic:  No cranial nerve deficits noted. Station and gait are normal. DTRs are symmetrical throughout. Sensory, motor and coordinative functions appear intact. Skin:  Intact without suspicious lesions or rashes Psych:  Cognition and judgment appear intact. Alert and cooperative with normal attention span and concentration. No apparent delusions, illusions, hallucinations   Impression & Recommendations:  Problem # 1:  WELL WOMAN (ICD-V70.0) Reviewed preventive care protocols, scheduled due services, and updated immunizations. Encouraged exercise,  weight maintanance, healthy eating habits. Due for DXA and mammogram.  Pap q 3 years..next due 2012.  Due for chol and DM reeval.  Problem # 2:  CA IN SITU, BREAST, DUCTAL (ICD-233.0) 10/2007 L partial  mastectomy for low grade ductal carcinoma insitu 2009 B oopherectomy, cervix and uterus present On tamoxifem x 5 years. Sees Onc.   Complete  Medication List: 1)  Adult Aspirin Low Strength 81 Mg Tbdp (Aspirin) .Marland Kitchen.. 1 by mouth once daily 2)  Fish Oil 500 Mg Caps (Omega-3 fatty acids) .... 2 by mouth once daily 3)  Bl Flax Seed Oil 1000 Mg Caps (Flaxseed (linseed)) .... Two times a day 4)  Calcium-magnesium 500-250 Mg Tabs (Calcium-magnesium) .... 2 by mouth once daily 5)  Red Yeast Rice 600 Mg Caps (Red yeast rice extract) .... Once daily 6)  Multivitamins Tabs (Multiple vitamin) .... Once daily 7)  Fiber Therapy 0.52 Gm Caps (Psyllium) .Marland Kitchen.. 1 by mouth two times a day 8)  Vitamin D 400 Unit Caps (Cholecalciferol) .... Once daily 9)  Zyrtec Allergy 10 Mg Tabs (Cetirizine hcl) .... Take one by mouth daily 10)  Tamoxifen Citrate 10 Mg Tabs (Tamoxifen citrate) .... Take one by mouth daily  Other Orders: Admin 1st Vaccine (18841) Flu Vaccine 62yrs + (66063) Radiology Referral (Radiology) TLB-Lipid Panel (80061-LIPID) TLB-BMP (Basic Metabolic Panel-BMET) (80048-METABOL) TLB-Hepatic/Liver Function Pnl (80076-HEPATIC)  Patient Instructions: 1)  Referral Appointment Information 2)  Day/Date: 3)  Time: 4)  Place/MD: 5)  Address: 6)  Phone/Fax: 7)  Patient given appointment information. Information/Orders faxed/mailed.   Current Allergies (reviewed today): No known allergies                Flu Vaccine Consent Questions     Do  you have a history of severe allergic reactions to this vaccine? no    Any prior history of allergic reactions to egg and/or gelatin? no    Do you have a sensitivity to the preservative Thimersol? no    Do you have a past history of Guillan-Barre Syndrome? no    Do you currently have an acute febrile illness? no    Have you ever had a severe reaction to latex? no    Vaccine information given and explained to patient? yes    Are you currently pregnant? no    Lot Number:[id number]   Exp Date:06/02/2010   Site Given  Left Deltoid IMbflu Last Flu Vaccine:  Fluvax 3+ (08/24/2008 10:54:20 AM)  Flu Vaccine Result Date:  09/21/2009 Flu Vaccine Result:  given Flu Vaccine Next Due:  1 yr Last Colonoscopy:  Done (08/04/2005 11:03:51 AM) Colonoscopy Next Due:  5 yr Hemoccult Next Due:  1 yr Last PAP:  normal (08/24/2008 2:55:12 PM) PAP Next Due:  3 yr       Past Medical History:    Reviewed history from 08/08/2007 and no changes required:       Allergic rhinitis       Hyperlipidemia  Past Surgical History:    Reviewed history from 08/24/2008 and no changes required:       2005 Foot surgery x 4       1959 Tonsillectomy       2004 DEXA - 0.03       2006 DEXA 0.5       10/2007 L partial  mastectomy for low grade ductal carcinoma insitu       2009 B oopherectomy, cervix and uterus present       06/2008 foot surgery,  fibroma removal        01/2008 BSO..still has uterus, cervix.

## 2011-01-03 NOTE — Procedures (Signed)
Summary: Colonoscopy Report/Dr. Sharrell Ku  Colonoscopy Report/Dr. Sharrell Ku   Imported By: Maryln Gottron 10/20/2009 14:09:27  _____________________________________________________________________  External Attachment:    Type:   Image     Comment:   External Document  Appended Document: Orders Update    Clinical Lists Changes  Observations: Added new observation of LST COLON DT: 10/21/2009 (10/21/2009 14:05) Added new observation of COLONNXTDUE: 10/21/2014 (10/21/2009 14:05) Added new observation of COLONOSCOPY: normal (10/21/2009 14:05)      Last Colonoscopy:  Done (08/04/2005 11:03:51 AM) Colonoscopy Result Date:  10/21/2009 Colonoscopy Result:  normal Colonoscopy Next Due:  5 yr Kerby Nora MD  October 21, 2009 2:05 PM

## 2011-01-03 NOTE — Progress Notes (Signed)
  Phone Note Call from Patient Call back at Work Phone [phone]   Caller: Patient Call For: Dr. Ermalene Searing Summary of Call: This patient phoned this a.m. stating that she is supposed to have a biopsy of her breast and she hasn't heard anything about getting it set up.  I received a notice from you saying that the patient was aware of the need for a biopsy and I assumed that you had notified Shirlee Limerick for a referral.  Is that not the case? Initial call taken by: Delilah Shan,  September 18, 2007 9:22 AM  Follow-up for Phone Call        Check with [facility] about whteher they have arranged biopsy referral if not I will refer to surgery. Follow-up by: Kerby Nora MD,  September 18, 2007 9:28 AM  Additional Follow-up for Phone Call Additional follow up Details #1::        [facility] does not set up any biopsies.  They do schedule patients for additional views if needed but they do not set up any biopsies.  Patient prefers someone in [place], has no specific one in mind. Additional Follow-up by: Delilah Shan,  September 18, 2007 10:11 AM  New Problems: ABFND, BREAST MAMMOGRAPHIC MICROCALCIFICATION (ICD-793.81)   Additional Follow-up for Phone Call Additional follow up Details #2::    APPT SET UP AT [facility] FOR A STEREOTACTIC CORE BIOPSY ON 09/24/07 AT 8:00 PT WILL BRING HER MMG FILMS WITH HER. SURGEONS DONT DO THESE BIOPSIES THEY WOULD SEE HER IF SHE NEEDS LUMPECTOMY ETC....................................................................Carlton Adam  September 18, 2007 4:17 PM  Follow-up by: Carlton Adam,  September 18, 2007 4:17 PM  New Problems: ABFND, BREAST MAMMOGRAPHIC MICROCALCIFICATION (ICD-793.81)

## 2011-01-03 NOTE — Letter (Signed)
Summary: Results Follow up Letter  [facility]  [address] [place], [place] [address]   Phone: [phone]  Fax: [phone]    09/22/2009 MRN: [id number]     Bridget Cummings [address] [place], [place]  [address]    Dear Ms. Lust,  The following are the results of your recent test(s):  Test         Result    Pap Smear:        Normal _____  Not Normal _____ Comments: ______________________________________________________ Cholesterol: LDL(Bad cholesterol):         Your goal is less than:         HDL (Good cholesterol):       Your goal is more than: Comments:  ______________________________________________________ Mammogram:        Normal _____  Not Normal _____ Comments:  ___________________________________________________________________ Hemoccult:        Normal _____  Not normal _______ Comments:    _____________________________________________________________________ Other Tests:Bone Density test was normal please repeat in 2 years    We routinely do not discuss normal results over the telephone.  If you desire a copy of the results, or you have any questions about this information we can discuss them at your next office visit.   Sincerely,   Kerby Nora MD

## 2011-01-03 NOTE — Assessment & Plan Note (Signed)
Summary: cpx/alc   Vital Signs:  Patient profile:   60 year old female Height:      65.75 inches Weight:      187.0 pounds BMI:     30.52 Temp:     99.3 degrees F oral Pulse rate:   72 / minute Pulse rhythm:   regular BP sitting:   120 / 78  (left arm) Cuff size:   regular  Vitals Entered By: Benny Lennert CMA Duncan Dull) (September 13, 2010 11:25 AM)  History of Present Illness: Chief complaint cpx not due for pap until 2012  The patient is here for annual wellness exam and preventative care.    Breast adenocarcinoma.Marland Kitchenlast OV Dr. Welton Flakes 03/2010 Has appt with Dr. Derrell Lolling 10/2010  Tamoxifen for breast cacner..causing weight gain. HAs been on for 3 years.     Problems Prior to Update: 1)  Ca in Situ, Breast, Ductal  (ICD-233.0) 2)  Well Woman  (ICD-V70.0) 3)  Screening For Osteoporosis  (ICD-V82.81) 4)  Screening Mammogram Nec  (ICD-V76.12) 5)  Gynecological Examination, Routine  (ICD-V72.31) 6)  Obesity  (ICD-278.00) 7)  Hyperlipidemia  (ICD-272.4) 8)  Allergic Rhinitis  (ICD-477.9)  Current Medications (verified): 1)  Adult Aspirin Low Strength 81 Mg  Tbdp (Aspirin) .Marland Kitchen.. 1 By Mouth Once Daily 2)  Fish Oil 500 Mg  Caps (Omega-3 Fatty Acids) .... 2 By Mouth Once Daily 3)  Bl Flax Seed Oil 1000 Mg  Caps (Flaxseed (Linseed)) .... Two Times A Day 4)  Calcium-Magnesium 500-250 Mg  Tabs (Calcium-Magnesium) .... 2 By Mouth Once Daily 5)  Multivitamins   Tabs (Multiple Vitamin) .... Once Daily 6)  Fiber Therapy 0.52 Gm  Caps (Psyllium) .Marland Kitchen.. 1 By Mouth Two Times A Day 7)  Vitamin D 400 Unit  Caps (Cholecalciferol) .... Once Daily 8)  Zyrtec Allergy 10 Mg Tabs (Cetirizine Hcl) .... Take One By Mouth Daily 9)  Tamoxifen Citrate 10 Mg Tabs (Tamoxifen Citrate) .... Take One By Mouth Daily  Allergies (verified): No Known Drug Allergies  Past History:  Past medical, surgical, family and social histories (including risk factors) reviewed, and no changes noted (except as noted  below).  Past Medical History: Reviewed history from 08/08/2007 and no changes required. Allergic rhinitis Hyperlipidemia  Past Surgical History: Reviewed history from 09/21/2009 and no changes required. 2005 Foot surgery x 4 1959 Tonsillectomy 2004 DEXA - 0.03 2006 DEXA 0.5 10/2007 L partial  mastectomy for low grade ductal carcinoma insitu 2009 B oopherectomy, cervix and uterus present 06/2008 foot surgery,  fibroma removal  01/2008 BSO..still has uterus, cervix.   Family History: Reviewed history from 08/08/2007 and no changes required. Father: Died 60, kidney failure, Type I DM Mother: Alive 20. breast CA, HTN Siblings: 3 brothers DM, 1 sister died at age 82 of breast CA No MI < 30 DM:  (+) Breast CA: Mother and Sister  Social History: Reviewed history from 08/08/2007 and no changes required. Never Smoked Alcohol use-yes, glass of wine q. 6 months Drug use-no Regular exercise-yes, walks 45 minutes q. 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 CV:  Denies chest pain or discomfort. Resp:  Denies shortness of breath. GI:  Complains of constipation; denies abdominal pain and bloody stools; Using miralax every other day.. GU:  Denies abnormal vaginal bleeding and dysuria; no vaginal bleeding.Marland Kitchenovaries removed.Marland Kitchen  Physical  Exam  General:  Well-developed,well-nourished,in no acute distress; alert,appropriate and cooperative throughout examination Eyes:  No corneal or conjunctival inflammation noted. EOMI. Perrla. Funduscopic exam benign, without hemorrhages, exudates or papilledema. Vision grossly normal. Ears:  External ear exam shows no significant lesions or deformities.  Otoscopic examination reveals clear canals, tympanic membranes are intact bilaterally without bulging, retraction,  inflammation or discharge. Hearing is grossly normal bilaterally. Nose:  External nasal examination shows no deformity or inflammation. Nasal mucosa are pink and moist without lesions or exudates. Mouth:  Oral mucosa and oropharynx without lesions or exudates.  Teeth in good repair. Neck:  no carotid bruit or thyromegaly  Chest Wall:  No deformities, masses, or tenderness noted. Breasts:  No mass, nodules, thickening, tenderness, bulging, retraction, inflamation, nipple discharge or skin changes noted.   Lungs:  Normal respiratory effort, chest expands symmetrically. Lungs are clear to auscultation, no crackles or wheezes. Heart:  Normal rate and regular rhythm. S1 and S2 normal without gallop, murmur, click, rub or other extra sounds. Abdomen:  Bowel sounds positive,abdomen soft and non-tender without masses, organomegaly or hernias noted. Genitalia:  Pelvic Exam:        External: normal female genitalia without lesions or masses        Vagina: normal without lesions or masses        Cervix: normal without lesions or masses        Adnexa: no ovaries        Uterus: normal by palpation        Pap smear: not performed Pulses:  R and L posterior tibial pulses are full and equal bilaterally  Extremities:  no edmea  Neurologic:  No cranial nerve deficits noted. Station and gait are normal. DTRs are symmetrical throughout. Sensory, motor and coordinative functions appear intact. Skin:  Intact without suspicious lesions or rashes Psych:  Cognition and judgment appear intact. Alert and cooperative with normal attention span and concentration. No apparent delusions, illusions, hallucinations   Impression & Recommendations:  Problem # 1:  WELL WOMAN (ICD-V70.0) The patient's preventative maintenance and recommended screening tests for an annual wellness exam were reviewed in full today. Brought up to date unless services declined.  Counselled on the importance of diet, exercise, and its role in  overall health and mortality. The patient's FH and SH was reviewed, including their home life, tobacco status, and drug and alcohol status.     Problem # 2:  CA IN SITU, BREAST, DUCTAL (ICD-233.0) Per ONC/surgery.  HAs upcoming mammogram.   Problem # 3:  HYPERLIPIDEMIA (ICD-272.4) Due for reeval Encouraged exercise, weight loss, healthy eating habits.  Orders: TLB-BMP (Basic Metabolic Panel-BMET) (80048-METABOL) TLB-Lipid Panel (80061-LIPID) TLB-Hepatic/Liver Function Pnl (80076-HEPATIC)  Complete Medication List: 1)  Adult Aspirin Low Strength 81 Mg Tbdp (Aspirin) .Marland Kitchen.. 1 by mouth once daily 2)  Fish Oil 500 Mg Caps (Omega-3 fatty acids) .... 2 by mouth once daily 3)  Bl Flax Seed Oil 1000 Mg Caps (Flaxseed (linseed)) .... Two times a day 4)  Calcium-magnesium 500-250 Mg Tabs (Calcium-magnesium) .... 2 by mouth once daily 5)  Multivitamins Tabs (Multiple vitamin) .... Once daily 6)  Fiber Therapy 0.52 Gm Caps (Psyllium) .Marland Kitchen.. 1 by mouth two times a day 7)  Vitamin D 400 Unit Caps (Cholecalciferol) .... Once daily 8)  Zyrtec Allergy 10 Mg Tabs (Cetirizine hcl) .... Take one by mouth daily 9)  Tamoxifen Citrate 10 Mg Tabs (Tamoxifen citrate) .... Take one by mouth daily  Other Orders: Admin 1st Vaccine ([address])  Flu Vaccine 67yrs + (06237) Tdap => 29yrs IM (62831) Admin of Any Addtl Vaccine (51761) Admin 1st Vaccine [facility]) [phone]) Admin of Any Addtl Vaccine (State) [phone])  Patient Instructions: 1)  Please schedule a follow-up appointment in 1 year.   Current Allergies (reviewed today): No known allergies          Flu Vaccine Consent Questions     Do you have a history of severe allergic reactions to this vaccine? no    Any prior history of allergic reactions to egg and/or gelatin? no    Do you have a sensitivity to the preservative Thimersol? no    Do you have a past history of Guillan-Barre Syndrome? no    Do you currently have an acute febrile illness? no    Have  you ever had a severe reaction to latex? no    Vaccine information given and explained to patient? yes    Are you currently pregnant? no    Lot Number:[id number]   Exp Date:06/03/2011   Site Given  Left Deltoid IM.lbflu Last Flu Vaccine:  given (09/21/2009 10:58:27 AM) Flu Vaccine Result Date:  09/13/2010 Flu Vaccine Result:  given Flu Vaccine Next Due:  1 yr Hemoccult Next Due:  Not Indicated Last PAP:  normal (08/24/2008 2:55:12 PM) PAP Result Date:  09/13/2010 PAP Result:  DVE no pap, q 3 years PAP Next Due:  1 yr Breast MRI : nml 2010.Marland Kitchenq2 years Last DEXA done 2010.       Tetanus/Td Vaccine    Vaccine Type: Tdap    Site: right deltoid    Mfr: GlaxoSmithKline    Dose: 0.5 ml    Route: IM    Given by: Benny Lennert CMA (AAMA)    Exp. Date: 09/22/2012    Lot #: [id number]    VIS given: 10/21/08 version given September 13, 2010.

## 2011-01-03 NOTE — Assessment & Plan Note (Signed)
Summary: ?sinusitis/tsc   Vital Signs:  Patient Profile:   60 Years Old Female Height:     66.25 inches (168.28 cm) Weight:      154 pounds Temp:     98 degrees F oral Pulse rate:   92 / minute Pulse rhythm:   regular BP sitting:   124 / 80  (left arm) Cuff size:   regular  Vitals Entered By: Delilah Shan (December 20, 2007 9:10 AM)                 Chief Complaint:  ? sinusitis.  Acute Visit History:      The patient complains of nasal discharge and sinus problems.  These symptoms began 2 months ago.  She denies cough and fever.  Other comments include: afrin,claritin congestion sneezing.        She complains of sinus pressure, ears being blocked, and frontal headache.        Current Allergies (reviewed today): No known allergies   Past Medical History:    Reviewed history from 08/08/2007 and no changes required:       Allergic rhinitis       Hyperlipidemia   Family History:    Reviewed history from 08/08/2007 and no changes required:       Father: Died 66, kidney failure, Type I DM       Mother: Alive 66. breast CA, HTN       Siblings: 3 brothers DM, 1 sister died at age 76 of breast CA       No MI < 62       DM:  (+)       Breast CA: Mother and Sister           Review of Systems      See HPI   Physical Exam  General:     Well-developed,well-nourished,in no acute distress; alert,appropriate and cooperative throughout examination Head:     no max sinus tenderness Ears:     External ear exam shows no significant lesions or deformities.  Otoscopic examination reveals clear canals, tympanic membranes are intact bilaterally without bulging, retraction, inflammation or discharge. Hearing is grossly normal bilaterally. Nose:     nasal dischargemucosal pallor.   Mouth:     Oral mucosa and oropharynx without lesions or exudates.  Teeth in good repair. Lungs:     Normal respiratory effort, chest expands symmetrically. Lungs are clear to auscultation, no  crackles or wheezes. Heart:     Normal rate and regular rhythm. S1 and S2 normal without gallop, murmur, click, rub or other extra sounds. Cervical Nodes:     No lymphadenopathy noted     Impression & Recommendations:  Problem # 1:  ALLERGIC RHINITIS (ICD-477.9)  Her updated medication list for this problem includes:    Fluticasone Propionate 50 Mcg/act Susp (Fluticasone propionate) .Marland Kitchen... 2 sprays per nostril daily   Complete Medication List: 1)  Adult Aspirin Low Strength 81 Mg Tbdp (Aspirin) .Marland Kitchen.. 1 by mouth once daily 2)  Fish Oil 500 Mg Caps (Omega-3 fatty acids) .... 2 by mouth once daily 3)  Bl Flax Seed Oil 1000 Mg Caps (Flaxseed (linseed)) .... Two times a day 4)  Calcium-magnesium 500-250 Mg Tabs (Calcium-magnesium) .... 2 by mouth once daily 5)  Red Yeast Rice 600 Mg Caps (Red yeast rice extract) .... Once daily 6)  Multivitamins Tabs (Multiple vitamin) .... Once daily 7)  Fiber Therapy 0.52 Gm Caps (Psyllium) .Marland Kitchen.. 1 by mouth two times  a day 8)  Vitamin D 400 Unit Caps (Cholecalciferol) .... Once daily 9)  Fluticasone Propionate 50 Mcg/act Susp (Fluticasone propionate) .... 2 sprays per nostril daily   Patient Instructions: 1)  Nasal saline, zyrtec D (only use the decongestant component temporarily then go to plain zyrtec when feeling better) 2)  fluticasone nasal spray 3)  Stop afrin type medicines. 4)  Call if not improving in 3-4 day.s    Prescriptions: FLUTICASONE PROPIONATE 50 MCG/ACT  SUSP (FLUTICASONE PROPIONATE) 2 sprays per nostril daily  #1 bottle x 3   Entered and Authorized by:   Kerby Nora MD   Signed by:   Kerby Nora MD on 12/20/2007   Method used:   Print then Give to Patient   RxID:   [id number]  ] Current Allergies (reviewed today): No known allergies  Current Medications (including changes made in today's visit):  ADULT ASPIRIN LOW STRENGTH 81 MG  TBDP (ASPIRIN) 1 by mouth once daily FISH OIL 500 MG  CAPS (OMEGA-3 FATTY ACIDS) 2 by  mouth once daily BL FLAX SEED OIL 1000 MG  CAPS (FLAXSEED (LINSEED)) two times a day CALCIUM-MAGNESIUM 500-250 MG  TABS (CALCIUM-MAGNESIUM) 2 by mouth once daily RED YEAST RICE 600 MG  CAPS (RED YEAST RICE EXTRACT) once daily MULTIVITAMINS   TABS (MULTIPLE VITAMIN) once daily FIBER THERAPY 0.52 GM  CAPS (PSYLLIUM) 1 by mouth two times a day VITAMIN D 400 UNIT  CAPS (CHOLECALCIFEROL) once daily FLUTICASONE PROPIONATE 50 MCG/ACT  SUSP (FLUTICASONE PROPIONATE) 2 sprays per nostril daily   Appended Document: Orders Update    Clinical Lists Changes  Orders: Added new Service order of Est. Patient Level III (44034) - Signed       Appended Document: ?sinusitis/tsc   Influenza Vaccine    Vaccine Type: Fluvax 3+    Site: left deltoid    Mfr: Sanofi Pasteur    Dose: 0.5 ml    Route: IM    Given by: Delilah Shan    Exp. Date: 06/02/2008    Lot #: [id number]    VIS given: 06/27/07 version given December 20, 2007.  Flu Vaccine Consent Questions    Do you have a history of severe allergic reactions to this vaccine? no    Any prior history of allergic reactions to egg and/or gelatin? no    Do you have a sensitivity to the preservative Thimersol? no    Do you have a past history of Guillan-Barre Syndrome? no    Do you currently have an acute febrile illness? no    Have you ever had a severe reaction to latex? no    Vaccine information given and explained to patient? yes    Are you currently pregnant? no

## 2011-01-06 NOTE — Consult Note (Signed)
Summary: [facility]/Breast Cancer F/U/Dr. Prec[facility]/Breast Cancer F/U/Dr. Derrell Lolling   Imported By: Eleonore Chiquito 04/28/2008 15:50:11  _____________________________________________________________________  External Attachment:    Type:   Image     Comment:   External Document

## 2011-01-06 NOTE — Consult Note (Signed)
Summary: [facility]/Consultation Report/Dr. [facility]/Consultation Report/Dr. Derrell Lolling   Imported By: Mickle Asper 11/12/2008 15:20:41  _____________________________________________________________________  External Attachment:    Type:   Image     Comment:   External Document

## 2011-03-20 ENCOUNTER — Other Ambulatory Visit: Payer: Self-pay | Admitting: Oncology

## 2011-03-20 ENCOUNTER — Encounter (HOSPITAL_BASED_OUTPATIENT_CLINIC_OR_DEPARTMENT_OTHER): Payer: BC Managed Care – PPO | Admitting: Oncology

## 2011-03-20 DIAGNOSIS — D0590 Unspecified type of carcinoma in situ of unspecified breast: Secondary | ICD-10-CM

## 2011-03-20 DIAGNOSIS — C50119 Malignant neoplasm of central portion of unspecified female breast: Secondary | ICD-10-CM

## 2011-03-20 DIAGNOSIS — Z170 Estrogen receptor positive status [ER+]: Secondary | ICD-10-CM

## 2011-03-20 LAB — CBC WITH DIFFERENTIAL/PLATELET
BASO%: 0.6 % (ref 0.0–2.0)
Basophils Absolute: 0 10*3/uL (ref 0.0–0.1)
EOS%: 4.1 % (ref 0.0–7.0)
Eosinophils Absolute: 0.3 10*3/uL (ref 0.0–0.5)
HCT: 39.2 % (ref 34.8–46.6)
HGB: 13.2 g/dL (ref 11.6–15.9)
LYMPH%: 32.7 % (ref 14.0–49.7)
MCH: 31.7 pg (ref 25.1–34.0)
MCHC: 33.8 g/dL (ref 31.5–36.0)
MCV: 94 fL (ref 79.5–101.0)
MONO#: 0.4 10*3/uL (ref 0.1–0.9)
MONO%: 6.6 % (ref 0.0–14.0)
NEUT#: 3.7 10*3/uL (ref 1.5–6.5)
NEUT%: 56 % (ref 38.4–76.8)
Platelets: 215 10*3/uL (ref 145–400)
RBC: 4.17 10*6/uL (ref 3.70–5.45)
RDW: 12.8 % (ref 11.2–14.5)
WBC: 6.7 10*3/uL (ref 3.9–10.3)
lymph#: 2.2 10*3/uL (ref 0.9–3.3)

## 2011-03-20 LAB — COMPREHENSIVE METABOLIC PANEL
ALT: 35 U/L (ref 0–35)
AST: 31 U/L (ref 0–37)
Albumin: 3.4 g/dL — ABNORMAL LOW (ref 3.5–5.2)
Alkaline Phosphatase: 64 U/L (ref 39–117)
BUN: 15 mg/dL (ref 6–23)
CO2: 28 mEq/L (ref 19–32)
Calcium: 8.8 mg/dL (ref 8.4–10.5)
Chloride: 105 mEq/L (ref 96–112)
Creatinine, Ser: 0.98 mg/dL (ref 0.40–1.20)
Glucose, Bld: 174 mg/dL — ABNORMAL HIGH (ref 70–99)
Potassium: 4.3 mEq/L (ref 3.5–5.3)
Sodium: 141 mEq/L (ref 135–145)
Total Bilirubin: 0.6 mg/dL (ref 0.3–1.2)
Total Protein: 6 g/dL (ref 6.0–8.3)

## 2011-04-18 NOTE — Op Note (Signed)
Bridget Cummings, Bridget Cummings          ACCOUNT NO.:  [id number]   MEDICAL RECORD NO.:  [id number]          PATIENT TYPE:  AMB   LOCATION:  SDC                           FACILITY:  [facility]   PHYSICIAN:  Dineen Kid. Rana Snare, M.D.    DATE OF BIRTH:  1951/05/11   DATE OF PROCEDURE:  01/21/2008  DATE OF DISCHARGE:                               OPERATIVE REPORT   PREOPERATIVE DIAGNOSIS:  Breast cancer with the recommendation of  bilateral salpingo-oophorectomy per oncologist and general surgeon.   POSTOPERATIVE DIAGNOSIS:  Breast cancer with the recommendation of  bilateral salpingo-oophorectomy per oncologist and general surgeon.   PROCEDURE:  Laparoscopic bilateral salpingo-oophorectomy.   SURGEON:  Dineen Kid. Rana Snare, M.D.   ANESTHESIA:  General endotracheal.   INDICATIONS:  Bridget Cummings is a 60 year old with breast cancer who her  oncologist and general surgeon recommended bilateral salpingo-  oophorectomy due to the strong familial nature of ovarian cancer in this  patient. We did discuss the risks and benefits of the procedure at  length which include but are not limited to risks of infection,  bleeding, damage to the uterus, tubes, ovaries, bowel, or bladder, the  possibility that this may not alleviate the risk of ovarian cancer.  She  does give her informed consent and wished to proceed.   OPERATIVE FINDINGS:  Normal appearing liver, appendix, uterus, tubes,  ovaries and cul-de-sac.   DESCRIPTION OF PROCEDURE:  After adequate anesthesia, the patient was  placed in the dorsal lithotomy position.  She was sterilely prepped and  draped.  The bladder was sterilely drained.  A Graves speculum was  placed.  A tenaculum was placed on the anterior lip of the cervix.  A  Cohen tenaculum was then placed.  A 1 cm infraumbilical skin incision  was made.  A Veress needle was inserted.  The abdomen was insufflated to  dullness to percussion.  An 11 mm trocar was inserted.  The above  findings were noted  by the laparoscope.  A 5 mm trocar was inserted at  the left midline two fingerbreadths above the pubic symphysis under  direct visualization. The right tubo-ovarian complex was grasped with  atraumatic graspers. A gyrus cutting forceps was used to cut and  coagulate across the right infundibulopelvic ligament, across the  mesosalpinx, and across the utero-ovarian ligament, removing the right  fallopian tube and ovary. Good hemostasis was achieved with good  peristalsis of the underlying ureter on the right pelvic sidewall noted.  The left tube and ovary were identified, grasped with atraumatic  graspers, the left infundibulopelvic ligament was coagulated and  dissected with bipolar grasping forceps, and dissected across the  mesosalpinx and also the utero-ovarian ligaments. The left fallopian  tube and ovary were removed.  Good hemostasis was achieved and care  taken to avoid the underlying ureter. Good peristalsis was seen. After  careful examination of both pedicles and good hemostasis was achieved,  the left fallopian tube and ovary were removed through the umbilical  trocar intact.  The right fallopian tube and ovary were also removed  through the trocar intact without need for morcellation.  The trocars  were then removed. The left suprapubic port required bipolar to achieve  good hemostasis of a small bleeding vessel with good hemostasis  achieved.  The trocars were removed.  The abdomen was desufflated.  The  infraumbilical skin incision was closed with a 0 Vicryl interrupted  suture in the fascia, 3-0 Vicryl Rapide subcuticular suture.  The 5 mm  site required a 0 interrupted suture in the fascia and Dermabond with  good approximation and good hemostasis achieved at both incisions. 10 mL  0.25% Marcaine were used to infiltrate. The tenaculum was removed from  the cervix and noted to be hemostatic.  The patient was transferred to  the recovery room in stable condition.  Sponge,  needle, and instrument  was normal x3.  Estimated blood loss was minimal.  The patient did  receive 1 gram of cefotetan preoperatively and 30 mg Toradol  postoperatively.   DISPOSITION:  The patient will be discharged home.  She will follow up  in the office in 2-3 weeks.  She is sent home with the routine  instruction sheet for laparoscopy.  Told to return for increased pain,  fever or bleeding.  She also has a prescription for Mepergan.      Dineen Kid Rana Snare, M.D.  Electronically Signed     DCL/MEDQ  D:  01/21/2008  T:  01/21/2008  Job:  [id number]

## 2011-04-18 NOTE — Assessment & Plan Note (Signed)
[facility]                                 ON-CALL NOTE   NAME:Bridget Cummings, Bridget Cummings                   MRN:          [id number]  DATE:12/28/2007                            DOB:          06/19/1961    PRIMARY CARE PHYSICIAN:  Dr. Ermalene Searing.   CALLER:  Her husband's aunt, Vena Rua.   TELEPHONE NUMBER:  [phone]   TIME OF CALL:  1:53pm   The caller is complaining that she has been passing out all day and her  blood pressure has been going high. She just found out that her  grandchild died and was passing out in the emergency room, but was not  evaluated. I recommend that she be taken to the emergency room  to be  evaluated for the high blood pressure and sycope.     Lelon Perla, DO  Electronically Signed    Shawnie Dapper  DD: 12/28/2007  DT: 12/29/2007  Job #: [id number]   cc:   Kerby Nora, MD

## 2011-04-18 NOTE — Op Note (Signed)
NAMEKELENA, Bridget Cummings          ACCOUNT NO.:  [id number]   MEDICAL RECORD NO.:  [id number]          PATIENT TYPE:  AMB   LOCATION:  DSC                          FACILITY:  [facility]   PHYSICIAN:  Angelia Mould. Derrell Lolling, M.D.DATE OF BIRTH:  04/24/1951   DATE OF PROCEDURE:  10/16/2007  DATE OF DISCHARGE:                               OPERATIVE REPORT   PREOPERATIVE DIAGNOSIS:  Ductal carcinoma in situ, left breast.   POSTOPERATIVE DIAGNOSIS:  Ductal carcinoma in situ, left breast.   OPERATION PERFORMED:  Left partial mastectomy with needle localization  and specimen mammogram.   SURGEON:  Angelia Mould. Derrell Lolling, M.D.   OPERATIVE INDICATIONS:  This is a 60 year old white female who had  screening mammograms and then ultimately ultrasound and MRI.  A  stereotactic biopsy was performed on an area of microcalcifications  which were localized in the left breast in the upper central area, and  the pathology report showed low grade ductal carcinoma in situ which is  estrogen and progesterone receptor positive.  MRI shows this to be a  solitary finding.  Family history is strongly positive for cancer.  The  patient did not want to delay her lumpectomy while she waits for genetic  testing.  She has had blood drawn for genetic testing but wanted to go  ahead and get the lumpectomy done, so we are going to do that today and  decide later whether anything further will be done.   OPERATIVE TECHNIQUE:  The patient underwent needle localization at the  [facility] this morning and that was satisfactory.  The  patient was brought to [facility].  The x-rays were  reviewed.  The patient was brought to the operating room.  General  anesthesia was induced.  The patient was identified as correct patient,  correct procedure and correct site.  Intravenous antibiotics were given.  The left breast was prepped and draped in a sterile fashion.  Half  percent Marcaine with epinephrine was  used as a local infiltration  anesthetic.  A curved circumareolar incision was made in the upper  central portion of the left breast about 2 cm inferior to the wire  insertion site.  Dissection was carried down into the breast tissue and  then around the localizing wire circumferentially.  We took the  dissection all the way down to the pectoralis fascia.  The specimen was  marked with silk sutures to orient the pathologist.  The specimen  mammogram was performed and the report was that the wire and the clip  and all the abnormalities were contained within the specimen.  The  specimen was then sent for routine histology.  The wound was irrigated  with saline.  Hemostasis was excellent and achieved with electrocautery.  The subcutaneous tissue was closed with interrupted sutures of 3-0  Vicryl and the skin closed with a  running subcuticular suture of 4-0 Monocryl and Dermabond.  The patient  was taken to the recovery room in stable condition.  Estimated blood  loss was about 10 mL.  Complications, none.  Sponge, needle and  instrument counts were correct.  Angelia Mould. Derrell Lolling, M.D.  Electronically Signed     HMI/MEDQ  D:  10/16/2007  T:  10/17/2007  Job:  [id number]   cc:   Kerby Nora, MD  Anselmo Pickler, M.D.

## 2011-04-21 NOTE — Assessment & Plan Note (Signed)
[facility]                             [facility] OFFICE NOTE   NAME:Buckle, ROSE-MARIE                  MRN:          [id number]  DATE:08/20/2006                            DOB:          29-Mar-1951    NEW PATIENT HISTORY AND PHYSICAL   CHIEF COMPLAINT:  A 60 year old white female here to establish new doctor.   HISTORY OF PRESENT ILLNESS:  Ms. Garofano states that she was referred by  her OB/GYN at [facility] to be set up with a primary care doctor.  She has been doing prevention with her gynecologist and has not ever seen a  primary care doctor except for occasional sick visits to an urgent care.  She has the following medical concerns to discuss:  1. Increased cholesterol, chronic:  She states that her cholesterol was      measured to be evaluated over the past few years.  She was previously      on Lipitor 10 mg daily.  She came off the Lipitor about 6 months ago      secondary to muscle aches.  Her symptoms resolved after she came off of      the Lipitor.  She also states that she has always had some slightly      elevated liver enzymes.  She has been trying to manage her cholesterol      with diet, fish oil, flax oil, and red yeast rice over the past 6      months.  When her cholesterol was last checked this month, her LDL was      162 and her HDL was 52.  Triglycerides were 149.  2. Elevated blood pressure:  Ms. Hedgepeth states that she had one elevated      blood pressure measurement at her gynecologist's with a diastolic above      100.  She states that she has been very aggressively avoiding salt over      the past 2 weeks.  She has measured her blood pressure at the pharmacy      and it has been around 137/80.  She denies any chest pain, shortness of      breath, headache, dizziness, or syncope.   PAST MEDICAL HISTORY:  1. Allergic rhinitis.  2. High cholesterol.   HOSPITALIZATIONS, SURGERIES, AND PROCEDURES:  1. 2005  foot surgery x4.  2. 1959 tonsillectomy.  3. Mammogram September 2007, negative.  4. Pap smear September 2007.  5. Colonoscopy in September 2006.   ALLERGIES:  NONE.   MEDICATIONS:  1. Aspirin 81 mg daily.  2. Fish oil 500 mg one tablet twice daily.  3. Flax oil capsules twice daily.  4. Calcium with magnesium 500 mg 2 tablets daily.  5. Red yeast rice 1 tablet daily.  6. Multivitamin daily.  7. Fiber capsules 1 tablet p.o. b.i.d.   FAMILY HISTORY:  Father deceased at age 16 with kidney failure and possibly  type 1 diabetes.  Mother alive at age 59 with breast cancer and  hypertension.  There is no family history of MI at an age less than 61.  She  has 3 brothers, with one who has diabetes.  She has one sister who has  diabetes, who also died at age 59 with breast cancer.  There is no other  family history of cancer, depression, or alcohol or drug abuse.   SOCIAL HISTORY:  She works as a Psychologist, forensic at [employer].  She has been married for 35 years.  She has 2 children, who are  healthy.  She walks about 45 minutes per day.  She eats fruits, vegetables,  chicken, and is trying to avoid salt.   PHYSICAL EXAM:  VITAL SIGNS:  Height 66-1/4 inches, weight 195, making BMI  31.  Blood pressure 122/84, pulse 88, temperature 98.3.  GENERAL:  Healthy-appearing female, overweight, in no apparent distress.  HEENT:  PERRLA.  Extraocular muscles intact.  Oropharynx clear.  Tympanic  membranes clear.  Nares clear.  NECK:  No thyromegaly.  No lymphadenopathy.  CARDIOVASCULAR:  Regular rate and rhythm.  No murmurs, rubs, or gallops.  Normal PMI.  PULMONARY:  Clear to auscultation bilaterally.  No wheeze, rales, or  rhonchi.  No cyanosis.  No clubbing.  ABDOMEN:  Soft.  Nontender.  Normoactive bowel sounds.  No  hepatosplenomegaly.  MUSCULOSKELETAL:  Strength 5/5 in the upper and lower extremities.  NEURO:  Cranial nerves 2-12 grossly intact.  Alert and oriented times  3.  Gait within normal limits.   ASSESSMENT AND PLAN:  1. Hypercholesterolemia:  Her goal cholesterol really is somewhere less      than 160.  She has 0 risk factors, although a possibly elevated blood      pressure.  Her good cholesterol is at the correct limits.  She will      continue to try diet and exercise and was given information on lowering      cholesterol in her diet.  We will recheck her cholesterol in 3 months.      She was also instructed to increase her Omega-3 fatty acids to about      2000 to 4000 mg daily.  2. Elevated blood pressure:  Today her blood pressure is at goal.  She      will occasionally measure her blood pressure and will let me know if it      is greater than 140/90.  She will also continue a low-salt diet.  We      will evaluate this at her next visit.  3. Elevated liver enzymes:  She only has a slightly elevated ALT.  I will      obtain records from her previous doctor to determine if this has been      looked into.  4. Prevention:  She is up to date with prevention at this point in time.      She will return to clinic in 3 months.                                   Kerby Nora, MD   AB/MedQ  DD:  08/20/2006  DT:  08/20/2006  Job #:  [id number]

## 2011-05-05 ENCOUNTER — Encounter (INDEPENDENT_AMBULATORY_CARE_PROVIDER_SITE_OTHER): Payer: Self-pay | Admitting: General Surgery

## 2011-08-25 LAB — CBC
HCT: 45.8
Hemoglobin: 15.8 — ABNORMAL HIGH
MCHC: 34.6
MCV: 92.3
Platelets: 245
RBC: 4.96
RDW: 12.9
WBC: 5.4

## 2011-08-25 LAB — COMPREHENSIVE METABOLIC PANEL
ALT: 21
AST: 26
Albumin: 4.1
Alkaline Phosphatase: 76
BUN: 6
CO2: 29
Calcium: 9.6
Chloride: 102
Creatinine, Ser: 0.97
GFR calc Af Amer: >60
GFR calc non Af Amer: 60 — ABNORMAL LOW
Glucose, Bld: 93
Potassium: 4.1
Sodium: 139
Total Bilirubin: 1
Total Protein: 7.2

## 2011-08-31 ENCOUNTER — Other Ambulatory Visit: Payer: Self-pay | Admitting: Family Medicine

## 2011-08-31 DIAGNOSIS — Z9889 Other specified postprocedural states: Secondary | ICD-10-CM

## 2011-09-12 LAB — URINE MICROSCOPIC-ADD ON

## 2011-09-12 LAB — URINALYSIS, ROUTINE W REFLEX MICROSCOPIC
Bilirubin Urine: NEGATIVE
Glucose, UA: NEGATIVE
Hgb urine dipstick: NEGATIVE
Ketones, ur: NEGATIVE
Nitrite: NEGATIVE
Protein, ur: NEGATIVE
Specific Gravity, Urine: 1.02
Urobilinogen, UA: 0.2
pH: 6

## 2011-09-12 LAB — COMPREHENSIVE METABOLIC PANEL
ALT: 17
AST: 23
Albumin: 3.7
Alkaline Phosphatase: 83
BUN: 12
CO2: 30
Calcium: 9.1
Chloride: 104
Creatinine, Ser: 0.95
GFR calc Af Amer: >60
GFR calc non Af Amer: >60
Glucose, Bld: 107 — ABNORMAL HIGH
Potassium: 3.8
Sodium: 139
Total Bilirubin: 0.9
Total Protein: 6.5

## 2011-09-12 LAB — DIFFERENTIAL
Basophils Absolute: 0.1
Basophils Relative: 1
Eosinophils Absolute: 0.3
Eosinophils Relative: 5
Lymphocytes Relative: 34
Lymphs Abs: 1.9
Monocytes Absolute: 0.4
Monocytes Relative: 7
Neutro Abs: 3
Neutrophils Relative %: 54

## 2011-09-12 LAB — CBC
HCT: 45.8
Hemoglobin: 15.4 — ABNORMAL HIGH
MCHC: 33.7
MCV: 92.3
Platelets: 298
RBC: 4.96
RDW: 13.1
WBC: 5.6

## 2011-09-19 ENCOUNTER — Encounter: Payer: BC Managed Care – PPO | Admitting: Family Medicine

## 2011-09-20 ENCOUNTER — Encounter: Payer: Self-pay | Admitting: Family Medicine

## 2011-09-21 ENCOUNTER — Encounter: Payer: Self-pay | Admitting: Family Medicine

## 2011-09-21 ENCOUNTER — Ambulatory Visit (INDEPENDENT_AMBULATORY_CARE_PROVIDER_SITE_OTHER): Payer: BC Managed Care – PPO | Admitting: Family Medicine

## 2011-09-21 ENCOUNTER — Other Ambulatory Visit (HOSPITAL_COMMUNITY)
Admission: RE | Admit: 2011-09-21 | Discharge: 2011-09-21 | Disposition: A | Discharge status: Home / Self Care | Payer: BC Managed Care – PPO | Source: Ambulatory Visit | Attending: Family Medicine | Admitting: Family Medicine

## 2011-09-21 VITALS — BP 120/70 | HR 77 | Temp 98.2°F | Ht 66.0 in | Wt 168.1 lb

## 2011-09-21 DIAGNOSIS — C50919 Malignant neoplasm of unspecified site of unspecified female breast: Secondary | ICD-10-CM

## 2011-09-21 DIAGNOSIS — Z01419 Encounter for gynecological examination (general) (routine) without abnormal findings: Secondary | ICD-10-CM

## 2011-09-21 DIAGNOSIS — Z23 Encounter for immunization: Secondary | ICD-10-CM

## 2011-09-21 DIAGNOSIS — Z0000 Encounter for general adult medical examination without abnormal findings: Secondary | ICD-10-CM

## 2011-09-21 DIAGNOSIS — Z1159 Encounter for screening for other viral diseases: Secondary | ICD-10-CM | POA: Insufficient documentation

## 2011-09-21 DIAGNOSIS — Z853 Personal history of malignant neoplasm of breast: Secondary | ICD-10-CM | POA: Insufficient documentation

## 2011-09-21 DIAGNOSIS — E785 Hyperlipidemia, unspecified: Secondary | ICD-10-CM

## 2011-09-21 DIAGNOSIS — R252 Cramp and spasm: Secondary | ICD-10-CM | POA: Insufficient documentation

## 2011-09-21 LAB — LIPID PANEL
Cholesterol: 219 mg/dL — ABNORMAL HIGH (ref 0–200)
HDL: 78.6 mg/dL (ref >39.00)
Total CHOL/HDL Ratio: 3
Triglycerides: 152 mg/dL — ABNORMAL HIGH (ref 0.0–149.0)
VLDL: 30.4 mg/dL (ref 0.0–40.0)

## 2011-09-21 LAB — COMPREHENSIVE METABOLIC PANEL
ALT: 29 U/L (ref 0–35)
AST: 32 U/L (ref 0–37)
Albumin: 4 g/dL (ref 3.5–5.2)
Alkaline Phosphatase: 62 U/L (ref 39–117)
BUN: 14 mg/dL (ref 6–23)
CO2: 27 mEq/L (ref 19–32)
Calcium: 8.9 mg/dL (ref 8.4–10.5)
Chloride: 104 mEq/L (ref 96–112)
Creatinine, Ser: 1 mg/dL (ref 0.4–1.2)
GFR: 60.26 mL/min (ref >60.00)
Glucose, Bld: 80 mg/dL (ref 70–99)
Potassium: 3.7 mEq/L (ref 3.5–5.1)
Sodium: 142 mEq/L (ref 135–145)
Total Bilirubin: 0.6 mg/dL (ref 0.3–1.2)
Total Protein: 7.1 g/dL (ref 6.0–8.3)

## 2011-09-21 LAB — TSH: TSH: 3.72 u[IU]/mL (ref 0.35–5.50)

## 2011-09-21 LAB — VITAMIN B12: Vitamin B-12: 440 pg/mL (ref 211–911)

## 2011-09-21 LAB — LDL CHOLESTEROL, DIRECT: Direct LDL: 119.3 mg/dL

## 2011-09-21 NOTE — Patient Instructions (Signed)
Continue work on on [employer].. We will call with labs results regarding cramps and cholesterol med.  May need to decrease pravastatin.  Can try tonic water for cramps prn.

## 2011-09-21 NOTE — Assessment & Plan Note (Signed)
Eval with labs. Increase water. May be due to statin.  Can try tonic water.

## 2011-09-21 NOTE — Progress Notes (Signed)
Subjective:    Patient ID: Bridget Cummings, female    DOB: August 04, 1951, 60 y.o.   MRN: [id number]  HPI  The patient is here for annual wellness exam and preventative care.    Elevated Cholesterol: Last check LDL 145 (not at goal <130 in 12/2010) Using medications without problems: on pravastatin low dose, fish and flax seed. Have not rechecked chol on this new statin. Muscle aches: see below. Diet compliance:Good Exercise:Good Other complaints:  Cramps in  Legs and toes...ongoing 3 months. Worse at night. No fatigue. Feels well otherwise.   No new changes in med other than pravastatin start in 12/2010  Review of Systems  Constitutional: Negative for fever and fatigue.  HENT: Negative for ear pain.   Eyes: Negative for pain.  Respiratory: Negative for chest tightness and shortness of breath.   Cardiovascular: Negative for chest pain, palpitations and leg swelling.  Gastrointestinal: Negative for abdominal pain.  Genitourinary: Negative for dysuria.       Objective:   Physical Exam  Constitutional: Vital signs are normal. She appears well-developed and well-nourished. She is cooperative.  Non-toxic appearance. She does not appear ill. No distress.  HENT:  Head: Normocephalic.  Right Ear: Hearing, tympanic membrane, external ear and ear canal normal.  Left Ear: Hearing, tympanic membrane, external ear and ear canal normal.  Nose: Nose normal.  Eyes: Conjunctivae, EOM and lids are normal. Pupils are equal, round, and reactive to light. No foreign bodies found.  Neck: Trachea normal and normal range of motion. Neck supple. Carotid bruit is not present. No mass and no thyromegaly present.  Cardiovascular: Normal rate, regular rhythm, S1 normal, S2 normal, normal heart sounds and intact distal pulses.  Exam reveals no gallop.   No murmur heard. Pulmonary/Chest: Effort normal and breath sounds normal. No respiratory distress. She has no wheezes. She has no rhonchi. She has no  rales.  Abdominal: Soft. Normal appearance and bowel sounds are normal. She exhibits no distension, no fluid wave, no abdominal bruit and no mass. There is no hepatosplenomegaly. There is no tenderness. There is no rebound, no guarding and no CVA tenderness. No hernia.  Genitourinary: Vagina normal and uterus normal. No breast swelling, tenderness, discharge or bleeding. Pelvic exam was performed with patient prone. There is no rash, tenderness or lesion on the right labia. There is no rash, tenderness or lesion on the left labia. Uterus is not enlarged and not tender. Cervix exhibits no motion tenderness, no discharge and no friability. Right adnexum displays no mass, no tenderness and no fullness. Left adnexum displays no mass, no tenderness and no fullness.       PAP and DVE performed  Lymphadenopathy:    She has no cervical adenopathy.    She has no axillary adenopathy.  Neurological: She is alert. She has normal strength. No cranial nerve deficit or sensory deficit.  Skin: Skin is warm, dry and intact. No rash noted.  Psychiatric: Her speech is normal and behavior is normal. Judgment normal. Her mood appears not anxious. Cognition and memory are normal. She does not exhibit a depressed mood.          Assessment & Plan:  Complete Physical Exam: The patient's preventative maintenance and recommended screening tests for an annual wellness exam were reviewed in full today. Brought up to date unless services declined.  Counselled on the importance of diet, exercise, and its role in overall health and mortality. The patient's FH and SH was reviewed, including their home life, tobacco status,  and drug and alcohol status.   Due for mammogram..Marland KitchenHas appt next week. PAP/DVE: no ovaries due to hx of breast cancer, has uterus. Perform DVE and pap this year...then plan q3 year Pap schedule. Colonoscopy 2010, repeat in 5 years per Dr. Kinnie Scales Vaccines: flu today.

## 2011-09-21 NOTE — Assessment & Plan Note (Signed)
Due for re-eval on pravastatin 

## 2011-09-22 ENCOUNTER — Other Ambulatory Visit: Payer: Self-pay | Admitting: *Deleted

## 2011-09-22 LAB — VITAMIN D 25 HYDROXY (VIT D DEFICIENCY, FRACTURES): Vit D, 25-Hydroxy: 64 ng/mL (ref 30–89)

## 2011-09-22 MED ORDER — PRAVASTATIN SODIUM 10 MG PO TABS: 10.0000 mg | ORAL_TABLET | Freq: Every day | ORAL | Status: DC

## 2011-09-25 ENCOUNTER — Ambulatory Visit
Admission: RE | Admit: 2011-09-25 | Discharge: 2011-09-25 | Disposition: A | Discharge status: Home / Self Care | Payer: BC Managed Care – PPO | Source: Ambulatory Visit | Attending: Family Medicine | Admitting: Family Medicine

## 2011-09-25 DIAGNOSIS — Z9889 Other specified postprocedural states: Secondary | ICD-10-CM

## 2011-09-27 ENCOUNTER — Encounter: Payer: Self-pay | Admitting: Family Medicine

## 2011-10-05 ENCOUNTER — Ambulatory Visit (INDEPENDENT_AMBULATORY_CARE_PROVIDER_SITE_OTHER): Payer: BC Managed Care – PPO | Admitting: General Surgery

## 2011-10-05 ENCOUNTER — Encounter (INDEPENDENT_AMBULATORY_CARE_PROVIDER_SITE_OTHER): Payer: Self-pay | Admitting: General Surgery

## 2011-10-05 VITALS — BP 126/90 | HR 85 | Temp 99.4°F | Ht 66.0 in | Wt 171.2 lb

## 2011-10-05 DIAGNOSIS — Z853 Personal history of malignant neoplasm of breast: Secondary | ICD-10-CM

## 2011-10-05 NOTE — Progress Notes (Signed)
Chief Complaint  Patient presents with  . Breast Cancer Long Term Follow Up    DCIS - s/p Left PM 10/16/2007 - s/p XRT and on tamoxifen    HPI Bridget Cummings is a 60 y.o. female.   HPI   This patient is doing well. 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 She underwent a external beam radiation therapy. She is on tamoxifen and is followed by Dr. Drue Second. She has no complaints about her breasts  Recent bilateral mammograms performed at the [facility] on September 25, 2011 looked fine. They are  BI-RADS category one.  She states that she has no new health problems. Past Medical History  Diagnosis Date  . Allergy   . Hyperlipidemia     Past Surgical History  Procedure Date  . Breast lumpectomy 2008  . Ovary removed 2009  . Tonsillectomy   . Foot surgery     Family History  Problem Relation Age of Onset  . Diabetes Father     Social History History  Substance Use Topics  . Smoking status: Never Smoker   . Smokeless tobacco: Not on file  . Alcohol Use: No    No Known Allergies  Current Outpatient Prescriptions  Medication Sig Dispense Refill  . aspirin 81 MG tablet Take 81 mg by mouth daily.        . Flaxseed, Linseed, (FLAX SEED OIL PO) Take 2 capsules by mouth daily.       . Multiple Vitamin (MULTIVITAMIN) capsule Take 1 capsule by mouth daily.        . Omega-3 Fatty Acids (FISH OIL PO) Take 2 capsules by mouth daily.       . pravastatin (PRAVACHOL) 10 MG tablet Take 1 tablet (10 mg total) by mouth daily.  30 tablet  11  . tamoxifen (NOLVADEX) 10 MG tablet Take 10 mg by mouth 2 (two) times daily.        Marland Kitchen VITAMIN D, CHOLECALCIFEROL, PO Take by mouth.          Review of Systems Review of Systems  Constitutional: Negative for fever, chills and unexpected weight change.  HENT: Negative for hearing loss, congestion, sore throat, trouble swallowing and voice change.     Eyes: Negative for visual disturbance.  Respiratory: Negative for cough and wheezing.   Cardiovascular: Negative for chest pain, palpitations and leg swelling.  Gastrointestinal: Negative for nausea, vomiting, abdominal pain, diarrhea, constipation, blood in stool, abdominal distention and anal bleeding.  Genitourinary: Negative for hematuria, vaginal bleeding and difficulty urinating.  Musculoskeletal: Negative for arthralgias.  Skin: Negative for rash and wound.  Neurological: Negative for seizures, syncope and headaches.  Hematological: Negative for adenopathy. Does not bruise/bleed easily.  Psychiatric/Behavioral: Negative for confusion.    Blood pressure 126/90, pulse 85, temperature 99.4 F (37.4 C), height 5\' 6"  (1.676 m), weight 171 lb 3.2 oz (77.656 kg).  Physical Exam Physical Exam  Neck: Normal range of motion. Neck supple. No JVD present. No tracheal deviation present. No thyromegaly present.  Cardiovascular: Normal rate, regular rhythm and normal heart sounds.   No murmur heard. Pulmonary/Chest: Effort normal. No respiratory distress. She has no wheezes. She has no rales. She exhibits no tenderness.    Lymphadenopathy:    She has no cervical adenopathy.  Skin: Skin is warm and dry. No rash noted. No erythema. No pallor.  Psychiatric: She has a normal mood and affect.  Her behavior is normal. Judgment and thought content normal.    Data Reviewed I reviewed her mammograms and the notes from the [facility]. Assessment    Ductal carcinoma in situ left breast, diagnosed October 16, 2007.  Status post left partial mastectomy revealing low-grade, receptor +, 1.7 cm tumor.  Status post radiation therapy to the breast.  Current tamoxifen therapy  No evidence of recurrence at 4 years    Plan    She will return to see me in one year.  She will continue to see Dr. Drue Second annually.  I suggested to her that at the five-year mark, she could  reduce her breast surveillance visits to once a year. We will discuss that at her next visit.       Virginie Josten M 10/05/2011, 10:02 AM

## 2011-10-05 NOTE — Patient Instructions (Signed)
Your physical exam is normal and shows no sign of recurrent cancer. Your mammograms are also normal. Continue to take the tamoxifen and keep your appointments with Dr. Park Breed. Return to see me in one year.

## 2011-10-11 ENCOUNTER — Other Ambulatory Visit: Payer: Self-pay | Admitting: *Deleted

## 2011-10-11 MED ORDER — PRAVASTATIN SODIUM 10 MG PO TABS: 10.0000 mg | ORAL_TABLET | Freq: Every day | ORAL | Status: DC

## 2012-04-19 ENCOUNTER — Other Ambulatory Visit: Payer: BC Managed Care – PPO | Admitting: Lab

## 2012-04-19 ENCOUNTER — Ambulatory Visit: Payer: BC Managed Care – PPO | Admitting: Family

## 2012-04-26 ENCOUNTER — Other Ambulatory Visit (HOSPITAL_BASED_OUTPATIENT_CLINIC_OR_DEPARTMENT_OTHER): Payer: BC Managed Care – PPO | Admitting: Lab

## 2012-04-26 ENCOUNTER — Encounter: Payer: Self-pay | Admitting: Family

## 2012-04-26 ENCOUNTER — Ambulatory Visit (HOSPITAL_BASED_OUTPATIENT_CLINIC_OR_DEPARTMENT_OTHER): Payer: BC Managed Care – PPO | Admitting: Family

## 2012-04-26 ENCOUNTER — Telehealth: Payer: Self-pay | Admitting: *Deleted

## 2012-04-26 VITALS — BP 141/80 | HR 99 | Temp 98.2°F | Ht 66.0 in | Wt 188.8 lb

## 2012-04-26 DIAGNOSIS — D0510 Intraductal carcinoma in situ of unspecified breast: Secondary | ICD-10-CM

## 2012-04-26 DIAGNOSIS — Z170 Estrogen receptor positive status [ER+]: Secondary | ICD-10-CM

## 2012-04-26 DIAGNOSIS — C50119 Malignant neoplasm of central portion of unspecified female breast: Secondary | ICD-10-CM

## 2012-04-26 DIAGNOSIS — D0590 Unspecified type of carcinoma in situ of unspecified breast: Secondary | ICD-10-CM

## 2012-04-26 LAB — CBC WITH DIFFERENTIAL/PLATELET
BASO%: 0.8 % (ref 0.0–2.0)
Basophils Absolute: 0.1 10*3/uL (ref 0.0–0.1)
EOS%: 3.3 % (ref 0.0–7.0)
Eosinophils Absolute: 0.2 10*3/uL (ref 0.0–0.5)
HCT: 40.9 % (ref 34.8–46.6)
HGB: 13.8 g/dL (ref 11.6–15.9)
LYMPH%: 26.7 % (ref 14.0–49.7)
MCH: 31.5 pg (ref 25.1–34.0)
MCHC: 33.7 g/dL (ref 31.5–36.0)
MCV: 93.3 fL (ref 79.5–101.0)
MONO#: 0.5 10*3/uL (ref 0.1–0.9)
MONO%: 7 % (ref 0.0–14.0)
NEUT#: 4.2 10*3/uL (ref 1.5–6.5)
NEUT%: 62.2 % (ref 38.4–76.8)
Platelets: 221 10*3/uL (ref 145–400)
RBC: 4.38 10*6/uL (ref 3.70–5.45)
RDW: 13.1 % (ref 11.2–14.5)
WBC: 6.7 10*3/uL (ref 3.9–10.3)
lymph#: 1.8 10*3/uL (ref 0.9–3.3)
nRBC: 0 % (ref 0–0)

## 2012-04-26 LAB — COMPREHENSIVE METABOLIC PANEL
ALT: 26 U/L (ref 0–35)
AST: 24 U/L (ref 0–37)
Albumin: 4 g/dL (ref 3.5–5.2)
Alkaline Phosphatase: 63 U/L (ref 39–117)
BUN: 15 mg/dL (ref 6–23)
CO2: 29 mEq/L (ref 19–32)
Calcium: 9 mg/dL (ref 8.4–10.5)
Chloride: 106 mEq/L (ref 96–112)
Creatinine, Ser: 1.07 mg/dL (ref 0.50–1.10)
Glucose, Bld: 135 mg/dL — ABNORMAL HIGH (ref 70–99)
Potassium: 3.9 mEq/L (ref 3.5–5.3)
Sodium: 143 mEq/L (ref 135–145)
Total Bilirubin: 0.4 mg/dL (ref 0.3–1.2)
Total Protein: 6.1 g/dL (ref 6.0–8.3)

## 2012-04-26 NOTE — Progress Notes (Signed)
[facility]  Name: Bridget Cummings                  DATE: 04/26/2012 MRN: [id number]                      DOB: 15-Dec-1950  REFERRING PHYSICIAN: Excell Seltzer, MD  DIAGNOSIS: Patient Active Problem List  Diagnoses Date Noted  . Ductal carcinoma in situ of breast, left 09/21/2011  . Leg cramps 09/21/2011  . HYPERLIPIDEMIA 08/08/2007  . OBESITY 08/08/2007  . ALLERGIC RHINITIS 08/08/2007     Encounter Diagnosis  Name Primary?  . Ductal carcinoma in situ of breast, left Yes    PREVIOUS THERAPY: Left partial mastectomy Nov. 2008.   CURRENT THERAPY: Tamoxifen 20 mg daily.   INTERIM HISTORY:  Chief complaint is hot flashes attributable to Tamoxifen. Questions when she can come off Tamoxifen. Saw Dr. Derrell Lolling Nov. 2012, he recommends yearly follow-up. Had bilateral mammo October 2012, birads 1, recommend yearly. She is distressed that the [facility] requires diagnostic mammogram for 7 years following diagnosis and her insurance will not pay for diagnostics. She has to pay out of pocket, $300 which is a hardship on the family. She asks about having mammos at a different facility to avoid private pay for diagnostics, preferring to convert to screening mammos now.   No headache or blurred vision. No cough or shortness of breath. No abdominal pain or new bone pain. Bowel and bladder function are normal. Appetite is good, with adequate fluid intake. No self-detected breast problems or complaints. Remainder of the 10 point  review of systems is negative.  PHYSICAL EXAM: BP 141/80  Pulse 99  Temp 98.2 F (36.8 C)  Ht 5\' 6"  (1.676 m)  Wt 188 lb 12.8 oz (85.639 kg)  BMI 30.47 kg/m2 General: Well developed, well nourished, in no acute distress.  EENT: No ocular or oral lesions. No stomatitis.  Respiratory: Lungs are clear to auscultation bilaterally with normal respiratory movement and no accessory muscle use. Cardiac: No murmur, rub or tachycardia. No upper or lower extremity  edema.  GI: Abdomen is soft, no palpable hepatosplenomegaly. No fluid wave. No tenderness. Musculoskeletal: No kyphosis, no tenderness over the spine, ribs or hips. Lymph: No cervical, infraclavicular, axillary or inguinal adenopathy. Neuro: No focal neurological deficits. Psych: Alert and oriented X 3, appropriate mood and affect.    LABORATORY STUDIES:   Lab Results  Component Value Date   WBC 6.7 04/26/2012   HGB 13.8 04/26/2012   HCT 40.9 04/26/2012   MCV 93.3 04/26/2012   PLT 221 04/26/2012     IMPRESSION:  61 year old female with:  1. History left breast ductal carcinoma in situ, no evidence of recurrence clinically or mammographically. [facility] October 2013).  2. On Tamoxifen with fair tolerance. Has significant hot flashes.    PLAN:   1. She will consider continuing Tamoxifen, a total of 5 years is planned.  2. Return in 1 year with Dr. Welton Flakes. No lab needed, CBC has been stable with no change.   DISCUSSION: She repeatedly questions whether she can discontinue Tamoxifen. States she "hates taking Tamoxifen", mainly due to hot flashes. We discuss low dose Effexor for hot flashes and she declines. She has had bilateral oophorectomy and I explain that she is receiving some protection from that surgery, but there would be a small added benefit to adding Tamoxifen. She will discuss with her husband and make that decision. She just filled  a 90 day prescription, and at a minimum will complete it.   She also asks whether primary care can assume surveillance. I reiterate NCCN guidelines recommendation for a yearly visit with clinical breast exam, monthly self breast exam and yearly mammogram. She will discuss with primary care and make that decision. A follow-up appt is made for 1 year, and she agrees.

## 2012-04-26 NOTE — Telephone Encounter (Signed)
gave patient appointment for 04-2013 with no labs per orders from 04-26-2012 printed out calendar and gave to the patient

## 2012-06-25 ENCOUNTER — Other Ambulatory Visit: Payer: Self-pay | Admitting: Oncology

## 2012-06-25 ENCOUNTER — Telehealth: Payer: Self-pay | Admitting: Family Medicine

## 2012-06-25 DIAGNOSIS — Z853 Personal history of malignant neoplasm of breast: Secondary | ICD-10-CM

## 2012-06-25 DIAGNOSIS — Z9889 Other specified postprocedural states: Secondary | ICD-10-CM

## 2012-06-26 NOTE — Telephone Encounter (Signed)
error 

## 2012-09-24 ENCOUNTER — Telehealth (INDEPENDENT_AMBULATORY_CARE_PROVIDER_SITE_OTHER): Payer: Self-pay | Admitting: General Surgery

## 2012-09-24 NOTE — Telephone Encounter (Signed)
Patient calling status post breast cancer treatment with subsequent follow up for the last 5 years. She is seeing Dr Welton Flakes and having breast checks by her primary MD. She spoke with Dr Welton Flakes and she told her as long as she is having her breasts checked by a doctor every 6 months she did not need to see both her and Dr Derrell Lolling. Patient calling to see if it is okay not to follow up with Dr Derrell Lolling. Please advise.

## 2012-09-25 ENCOUNTER — Ambulatory Visit
Admission: RE | Admit: 2012-09-25 | Discharge: 2012-09-25 | Disposition: A | Discharge status: Home / Self Care | Payer: BC Managed Care – PPO | Source: Ambulatory Visit | Attending: Oncology | Admitting: Oncology

## 2012-09-25 DIAGNOSIS — Z853 Personal history of malignant neoplasm of breast: Secondary | ICD-10-CM

## 2012-09-25 DIAGNOSIS — Z9889 Other specified postprocedural states: Secondary | ICD-10-CM

## 2012-09-26 ENCOUNTER — Encounter: Payer: Self-pay | Admitting: Family Medicine

## 2012-09-26 ENCOUNTER — Ambulatory Visit (INDEPENDENT_AMBULATORY_CARE_PROVIDER_SITE_OTHER): Payer: BC Managed Care – PPO | Admitting: Family Medicine

## 2012-09-26 VITALS — BP 130/90 | HR 114 | Temp 98.7°F | Ht 66.0 in | Wt 191.0 lb

## 2012-09-26 DIAGNOSIS — R030 Elevated blood-pressure reading, without diagnosis of hypertension: Secondary | ICD-10-CM

## 2012-09-26 DIAGNOSIS — I10 Essential (primary) hypertension: Secondary | ICD-10-CM | POA: Insufficient documentation

## 2012-09-26 DIAGNOSIS — E1159 Type 2 diabetes mellitus with other circulatory complications: Secondary | ICD-10-CM | POA: Insufficient documentation

## 2012-09-26 DIAGNOSIS — E785 Hyperlipidemia, unspecified: Secondary | ICD-10-CM

## 2012-09-26 DIAGNOSIS — Z23 Encounter for immunization: Secondary | ICD-10-CM

## 2012-09-26 DIAGNOSIS — Z0000 Encounter for general adult medical examination without abnormal findings: Secondary | ICD-10-CM

## 2012-09-26 DIAGNOSIS — Z01419 Encounter for gynecological examination (general) (routine) without abnormal findings: Secondary | ICD-10-CM

## 2012-09-26 LAB — LIPID PANEL
Cholesterol: 214 mg/dL — ABNORMAL HIGH (ref 0–200)
HDL: 55.8 mg/dL (ref >39.00)
Total CHOL/HDL Ratio: 4
Triglycerides: 130 mg/dL (ref 0.0–149.0)
VLDL: 26 mg/dL (ref 0.0–40.0)

## 2012-09-26 LAB — COMPREHENSIVE METABOLIC PANEL
ALT: 48 U/L — ABNORMAL HIGH (ref 0–35)
AST: 42 U/L — ABNORMAL HIGH (ref 0–37)
Albumin: 3.7 g/dL (ref 3.5–5.2)
Alkaline Phosphatase: 77 U/L (ref 39–117)
BUN: 14 mg/dL (ref 6–23)
CO2: 27 mEq/L (ref 19–32)
Calcium: 8.9 mg/dL (ref 8.4–10.5)
Chloride: 104 mEq/L (ref 96–112)
Creatinine, Ser: 1 mg/dL (ref 0.4–1.2)
GFR: 60.76 mL/min (ref >60.00)
Glucose, Bld: 86 mg/dL (ref 70–99)
Potassium: 3.8 mEq/L (ref 3.5–5.1)
Sodium: 139 mEq/L (ref 135–145)
Total Bilirubin: 0.8 mg/dL (ref 0.3–1.2)
Total Protein: 7.2 g/dL (ref 6.0–8.3)

## 2012-09-26 LAB — LDL CHOLESTEROL, DIRECT: Direct LDL: 142.6 mg/dL

## 2012-09-26 NOTE — Progress Notes (Signed)
Subjective:    Patient ID: Bridget Cummings, female    DOB: Jul 22, 1951, 61 y.o.   MRN: [id number]  HPI  The patient is here for annual wellness exam and preventative care.    Elevated Cholesterol:  Due for re-eval on pravastatin. Lab Results  Component Value Date   CHOL 219* 09/21/2011   HDL 78.60 09/21/2011   LDLCALC 98 08/24/2008   LDLDIRECT 119.3 09/21/2011   TRIG 152.0* 09/21/2011   CHOLHDL 3 09/21/2011   Using medications without problems:None Muscle aches: None Diet compliance: Moderate. Exercise: walking frequently.Other complaints: Recent trip to [place].  BP usually well controlled at pharmacy.. Nml. She is somewhat nervous at office today.  She did take zyrtec D last night.  Nml stress test in 2003.   Review of Systems  Constitutional: Negative for fever, fatigue and unexpected weight change.  HENT: Negative for ear pain, congestion, sore throat, sneezing, trouble swallowing and sinus pressure.   Eyes: Negative for pain and itching.  Respiratory: Negative for cough, shortness of breath and wheezing.   Cardiovascular: Negative for chest pain, palpitations and leg swelling.  Gastrointestinal: Positive for constipation. Negative for nausea, abdominal pain, diarrhea and blood in stool.       Well controlled on miralax.  Genitourinary: Negative for dysuria, hematuria, vaginal discharge, difficulty urinating and menstrual problem.  Skin: Negative for rash.  Neurological: Negative for syncope, weakness, light-headedness, numbness and headaches.  Psychiatric/Behavioral: Negative for confusion and dysphoric mood. The patient is not nervous/anxious.        Objective:   Physical Exam  Constitutional: Vital signs are normal. She appears well-developed and well-nourished. She is cooperative.  Non-toxic appearance. She does not appear ill. No distress.  HENT:  Head: Normocephalic.  Right Ear: Hearing, tympanic membrane, external ear and ear canal normal.  Left Ear:  Hearing, tympanic membrane, external ear and ear canal normal.  Nose: Nose normal.  Eyes: Conjunctivae normal, EOM and lids are normal. Pupils are equal, round, and reactive to light. No foreign bodies found.  Neck: Trachea normal and normal range of motion. Neck supple. Carotid bruit is not present. No mass and no thyromegaly present.  Cardiovascular: Normal rate, regular rhythm, S1 normal, S2 normal, normal heart sounds and intact distal pulses.  Exam reveals no gallop.   No murmur heard. Pulmonary/Chest: Effort normal and breath sounds normal. No respiratory distress. She has no wheezes. She has no rhonchi. She has no rales.  Abdominal: Soft. Normal appearance and bowel sounds are normal. She exhibits no distension, no fluid wave, no abdominal bruit and no mass. There is no hepatosplenomegaly. There is no tenderness. There is no rebound, no guarding and no CVA tenderness. No hernia.  Genitourinary: Vagina normal and uterus normal. No breast swelling, tenderness, discharge or bleeding. Pelvic exam was performed with patient prone. There is no rash, tenderness or lesion on the right labia. There is no rash, tenderness or lesion on the left labia. Uterus is not enlarged and not tender.       Left breast scar tissue superiorly at site of lupectomy. Inverted nipples B.  Lymphadenopathy:    She has no cervical adenopathy.    She has no axillary adenopathy.  Neurological: She is alert. She has normal strength. No cranial nerve deficit or sensory deficit.  Skin: Skin is warm, dry and intact. No rash noted.  Psychiatric: Her speech is normal and behavior is normal. Judgment normal. Her mood appears not anxious. Cognition and memory are normal. She does not exhibit  a depressed mood.          Assessment & Plan:  The patient's preventative maintenance and recommended screening tests for an annual wellness exam were reviewed in full today. Brought up to date unless services declined.  Counselled on  the importance of diet, exercise, and its role in overall health and mortality. The patient's FH and SH was reviewed, including their home life, tobacco status, and drug and alcohol status.   Vaccines: given flu and shingles vaccine today. Diagnostic mammogram...09/2012 nml PAP/DVE: no ovaries due to hx of breast cancer, has uterus. Perform DVE yearly on  q3 year Pap schedule, last done 2012, next due 2015.  Colonoscopy 2010, repeat in 5 years per Dr. Kinnie Scales given history of breast cancer DEXA; nml 2010, low risk, no need for further until 61 yr old.

## 2012-09-26 NOTE — Patient Instructions (Addendum)
Continue working on regular exercise and healthy eating.  Stop at lab on way out. We will call with labs results. Stop zyrtec D and follow BP at home.

## 2012-10-02 ENCOUNTER — Encounter (INDEPENDENT_AMBULATORY_CARE_PROVIDER_SITE_OTHER): Payer: Self-pay | Admitting: General Surgery

## 2012-10-11 ENCOUNTER — Other Ambulatory Visit: Payer: Self-pay | Admitting: *Deleted

## 2012-10-11 MED ORDER — PRAVASTATIN SODIUM 10 MG PO TABS: 10.0000 mg | ORAL_TABLET | Freq: Every day | ORAL | Status: DC

## 2012-10-14 ENCOUNTER — Telehealth (INDEPENDENT_AMBULATORY_CARE_PROVIDER_SITE_OTHER): Payer: Self-pay | Admitting: General Surgery

## 2012-10-14 ENCOUNTER — Other Ambulatory Visit (INDEPENDENT_AMBULATORY_CARE_PROVIDER_SITE_OTHER): Payer: BC Managed Care – PPO

## 2012-10-14 DIAGNOSIS — R7989 Other specified abnormal findings of blood chemistry: Secondary | ICD-10-CM

## 2012-10-14 LAB — HEPATIC FUNCTION PANEL
ALT: 41 U/L — ABNORMAL HIGH (ref 0–35)
AST: 36 U/L (ref 0–37)
Albumin: 3.8 g/dL (ref 3.5–5.2)
Alkaline Phosphatase: 71 U/L (ref 39–117)
Bilirubin, Direct: 0.1 mg/dL (ref 0.0–0.3)
Total Bilirubin: 0.6 mg/dL (ref 0.3–1.2)
Total Protein: 6.6 g/dL (ref 6.0–8.3)

## 2012-10-14 NOTE — Telephone Encounter (Signed)
Returned call based on the following message received:  10/09/12: "Please call wants to discontinue to see Dr. Derrell Lolling unless he feels she needs to come in, will continue to see Dr. Welton Flakes."  10/09/12 message sent to Dr. Derrell Lolling: "This is a long term breast cancer patient. You last saw her in the office 10/05/11. Had mammogram 09/26/12 and has labs same date. Please advise if the patient's request is reasonable."  10/09/12 reply from Dr. Derrell Lolling: "As long as she continues regular follow up with Dr. Welton Flakes, she can see can see me on a PRN basis, if that is her desire. HMI"  Patient called and advised of Dr. Jacinto Halim reply. Stressed to her to continue with Dr. Welton Flakes, that he is on the same electronic system we are on, and if he feels that she should see Dr. Derrell Lolling sooner, he would advise. Patient agreed.

## 2012-10-15 ENCOUNTER — Encounter: Payer: Self-pay | Admitting: *Deleted

## 2012-10-15 LAB — HEPATITIS PANEL, ACUTE
HCV Ab: NEGATIVE
Hep A IgM: NEGATIVE
Hep B C IgM: NEGATIVE
Hepatitis B Surface Ag: NEGATIVE

## 2013-03-27 ENCOUNTER — Telehealth: Payer: Self-pay | Admitting: Oncology

## 2013-03-27 NOTE — Telephone Encounter (Signed)
, °

## 2013-04-14 ENCOUNTER — Other Ambulatory Visit: Payer: Self-pay | Admitting: *Deleted

## 2013-04-14 MED ORDER — PRAVASTATIN SODIUM 10 MG PO TABS: 10.0000 mg | ORAL_TABLET | Freq: Every day | ORAL | Status: DC

## 2013-04-25 ENCOUNTER — Ambulatory Visit: Payer: BC Managed Care – PPO | Admitting: Oncology

## 2013-06-11 ENCOUNTER — Encounter: Payer: Self-pay | Admitting: Oncology

## 2013-06-11 ENCOUNTER — Ambulatory Visit (HOSPITAL_BASED_OUTPATIENT_CLINIC_OR_DEPARTMENT_OTHER): Payer: BC Managed Care – PPO | Admitting: Oncology

## 2013-06-11 VITALS — BP 148/81 | HR 80 | Temp 98.1°F | Resp 20 | Ht 66.0 in | Wt 189.8 lb

## 2013-06-11 DIAGNOSIS — D0512 Intraductal carcinoma in situ of left breast: Secondary | ICD-10-CM

## 2013-06-11 DIAGNOSIS — D0590 Unspecified type of carcinoma in situ of unspecified breast: Secondary | ICD-10-CM

## 2013-06-11 NOTE — Progress Notes (Signed)
[facility]  Name: Bridget Cummings                  DATE: 06/11/2013 MRN: [id number]                      DOB: 1951/05/29  REFERRING PHYSICIAN: Excell Seltzer, MD  DIAGNOSIS: Patient Active Problem List  Diagnoses Date Noted  . Ductal carcinoma in situ of breast, left 09/21/2011  . Leg cramps 09/21/2011  . HYPERLIPIDEMIA 08/08/2007  . OBESITY 08/08/2007  . ALLERGIC RHINITIS 08/08/2007     Encounter Diagnosis  Name Primary?  . Ductal carcinoma in situ of breast, left Yes    PREVIOUS THERAPY: Left partial mastectomy Nov. 2008.   CURRENT THERAPY: Observation  INTERIM HISTORY:  Patient is seen in followup at one year her last visit was in May 2013. Clinically she seems to be doing well without any problems. She is off of tamoxifen and has done very well she has no complaints at all. She denies any fevers chills night sweats headaches shortness of breath chest pains palpitations or hot flashes. She has no myalgias and arthralgias no swelling in her lower extremities no vaginal discharge or bleeding. Remainder of the 10 point review of systems is negative.  PHYSICAL EXAM: BP 148/81  Pulse 80  Temp(Src) 98.1 F (36.7 C) (Oral)  Resp 20  Ht 5\' 6"  (1.676 m)  Wt 189 lb 12.8 oz (86.093 kg)  BMI 30.65 kg/m2 General: Well developed, well nourished, in no acute distress.  EENT: No ocular or oral lesions. No stomatitis.  Respiratory: Lungs are clear to auscultation bilaterally with normal respiratory movement and no accessory muscle use. Cardiac: No murmur, rub or tachycardia. No upper or lower extremity edema.  GI: Abdomen is soft, no palpable hepatosplenomegaly. No fluid wave. No tenderness. Musculoskeletal: No kyphosis, no tenderness over the spine, ribs or hips. Lymph: No cervical, infraclavicular, axillary or inguinal adenopathy. Neuro: No focal neurological deficits. Psych: Alert and oriented X 3, appropriate mood and affect.    LABORATORY STUDIES:   Lab Results   Component Value Date   WBC 6.7 04/26/2012   HGB 13.8 04/26/2012   HCT 40.9 04/26/2012   MCV 93.3 04/26/2012   PLT 221 04/26/2012     IMPRESSION:  62 year old female with history of left DCIS. Patient now has completed 5 years of tamoxifen. She is now 6 years out since diagnosis. Overall she is doing well without any problems.   PLAN:   #1 patient is done with her antiestrogen therapy.  #2 we discussed ongoing surveillance with self breast examinations and yearly mammograms.  #3 she will continue to see her primary care physician. I at this time we'll see her only on as needed basis.   Drue Second, MD Medical/Oncology [facility] [phone] (beeper) [phone] (Office)  06/11/2013, 4:38 PM

## 2013-07-22 ENCOUNTER — Other Ambulatory Visit: Payer: Self-pay | Admitting: Family Medicine

## 2013-07-22 DIAGNOSIS — Z853 Personal history of malignant neoplasm of breast: Secondary | ICD-10-CM

## 2013-09-23 ENCOUNTER — Other Ambulatory Visit: Payer: Self-pay

## 2013-09-23 ENCOUNTER — Other Ambulatory Visit: Payer: Self-pay | Admitting: Family Medicine

## 2013-09-23 DIAGNOSIS — Z853 Personal history of malignant neoplasm of breast: Secondary | ICD-10-CM

## 2013-09-26 ENCOUNTER — Ambulatory Visit
Admission: RE | Admit: 2013-09-26 | Discharge: 2013-09-26 | Disposition: A | Discharge status: Home / Self Care | Payer: BC Managed Care – PPO | Source: Ambulatory Visit | Attending: Family Medicine | Admitting: Family Medicine

## 2013-09-26 DIAGNOSIS — Z853 Personal history of malignant neoplasm of breast: Secondary | ICD-10-CM

## 2013-09-30 ENCOUNTER — Encounter: Payer: Self-pay | Admitting: Family Medicine

## 2013-09-30 ENCOUNTER — Ambulatory Visit (INDEPENDENT_AMBULATORY_CARE_PROVIDER_SITE_OTHER): Payer: BC Managed Care – PPO | Admitting: Family Medicine

## 2013-09-30 VITALS — BP 124/80 | HR 85 | Temp 98.5°F | Ht 65.0 in | Wt 191.8 lb

## 2013-09-30 DIAGNOSIS — Z23 Encounter for immunization: Secondary | ICD-10-CM

## 2013-09-30 DIAGNOSIS — E785 Hyperlipidemia, unspecified: Secondary | ICD-10-CM

## 2013-09-30 DIAGNOSIS — Z0000 Encounter for general adult medical examination without abnormal findings: Secondary | ICD-10-CM

## 2013-09-30 LAB — COMPREHENSIVE METABOLIC PANEL
ALT: 26 U/L (ref 0–35)
AST: 28 U/L (ref 0–37)
Albumin: 4.2 g/dL (ref 3.5–5.2)
Alkaline Phosphatase: 91 U/L (ref 39–117)
BUN: 16 mg/dL (ref 6–23)
CO2: 27 mEq/L (ref 19–32)
Calcium: 9.4 mg/dL (ref 8.4–10.5)
Chloride: 104 mEq/L (ref 96–112)
Creatinine, Ser: 1 mg/dL (ref 0.4–1.2)
GFR: 59.17 mL/min — ABNORMAL LOW (ref >60.00)
Glucose, Bld: 99 mg/dL (ref 70–99)
Potassium: 4.2 mEq/L (ref 3.5–5.1)
Sodium: 141 mEq/L (ref 135–145)
Total Bilirubin: 0.8 mg/dL (ref 0.3–1.2)
Total Protein: 7.3 g/dL (ref 6.0–8.3)

## 2013-09-30 LAB — LIPID PANEL
Cholesterol: 210 mg/dL — ABNORMAL HIGH (ref 0–200)
HDL: 55.7 mg/dL (ref >39.00)
Total CHOL/HDL Ratio: 4
Triglycerides: 142 mg/dL (ref 0.0–149.0)
VLDL: 28.4 mg/dL (ref 0.0–40.0)

## 2013-09-30 LAB — LDL CHOLESTEROL, DIRECT: Direct LDL: 140.2 mg/dL

## 2013-09-30 NOTE — Assessment & Plan Note (Signed)
Due for re-eval. 

## 2013-09-30 NOTE — Progress Notes (Signed)
The patient is here for annual wellness exam and preventative care.  Her mother passed away yesterday. Had renal failure. She had choked and was in ICU for a while.. Multiorgan failure... Removed support last week.  Handling it fairly well.  Elevated Cholesterol: Due for re-eval on pravastatin 10 mg.  Lab Results  Component Value Date   CHOL 214* 09/26/2012   HDL 55.80 09/26/2012   LDLCALC 98 08/24/2008   LDLDIRECT 142.6 09/26/2012   TRIG 130.0 09/26/2012   CHOLHDL 4 09/26/2012    Using medications without problems:None  Muscle aches: None  Diet compliance: Moderate.  Exercise:  Previously walking frequently.Other complaints:   BP usually well controlled at pharmacy.. Nml.  BP Readings from Last 3 Encounters:  09/30/13 124/80  06/11/13 148/81  09/26/12 130/90   Review of Systems  Constitutional: Negative for fever, fatigue and unexpected weight change.  HENT: Negative for ear pain, congestion, sore throat, sneezing, trouble swallowing and sinus pressure.  Eyes: Negative for pain and itching.  Respiratory: Negative for cough, shortness of breath and wheezing.  Cardiovascular: Negative for chest pain, palpitations and leg swelling.  Gastrointestinal: Positive for constipation. Negative for nausea, abdominal pain, diarrhea and blood in stool.  Well controlled on miralax.  Genitourinary: Negative for dysuria, hematuria, vaginal discharge, difficulty urinating and menstrual problem.  Skin: Negative for rash.  Neurological: Negative for syncope, weakness, light-headedness, numbness and headaches.  Psychiatric/Behavioral: Negative for confusion and dysphoric mood. The patient is not nervous/anxious.   Objective:   Physical Exam  Constitutional: Vital signs are normal. She appears well-developed and well-nourished. She is cooperative. Non-toxic appearance. She does not appear ill. No distress.  HENT:  Head: Normocephalic.  Right Ear: Hearing, tympanic membrane, external ear and  ear canal normal.  Left Ear: Hearing, tympanic membrane, external ear and ear canal normal.  Nose: Nose normal.  Eyes: Conjunctivae normal, EOM and lids are normal. Pupils are equal, round, and reactive to light. No foreign bodies found.  Neck: Trachea normal and normal range of motion. Neck supple. Carotid bruit is not present. No mass and no thyromegaly present.  Cardiovascular: Normal rate, regular rhythm, S1 normal, S2 normal, normal heart sounds and intact distal pulses. Exam reveals no gallop.  No murmur heard.  Pulmonary/Chest: Effort normal and breath sounds normal. No respiratory distress. She has no wheezes. She has no rhonchi. She has no rales.  Abdominal: Soft. Normal appearance and bowel sounds are normal. She exhibits no distension, no fluid wave, no abdominal bruit and no mass. There is no hepatosplenomegaly. There is no tenderness. There is no rebound, no guarding and no CVA tenderness. No hernia.  Genitourinary: Vagina normal and uterus normal. No breast swelling, tenderness, discharge or bleeding. Pelvic exam was performed with patient prone. There is no rash, tenderness or lesion on the right labia. There is no rash, tenderness or lesion on the left labia. Uterus is not enlarged and not tender.  NO PAP PERFORMED. Left breast scar tissue superiorly at site of lupectomy. Inverted nipples B.  Lymphadenopathy:  She has no cervical adenopathy.  She has no axillary adenopathy.  Neurological: She is alert. She has normal strength. No cranial nerve deficit or sensory deficit.  Skin: Skin is warm, dry and intact. No rash noted.  Psychiatric: Her speech is normal and behavior is normal. Judgment normal. Her mood appears not anxious. Cognition and memory are normal. She does not exhibit a depressed mood.  Assessment & Plan:   The patient's preventative maintenance and recommended screening  tests for an annual wellness exam were reviewed in full today.  Brought up to date unless services  declined.  Counselled on the importance of diet, exercise, and its role in overall health and mortality.  The patient's FH and SH was reviewed, including their home life, tobacco status, and drug and alcohol status.   Vaccines: given flu and shingles vaccine today.  Diagnostic mammogram...09/2013 nml  PAP/DVE: no ovaries due to hx of breast cancer, has uterus. Perform DVE yearly on q3 year Pap schedule, last done 2012, next due 2015.  Colonoscopy 2010, repeat in 5 years per Dr. Kinnie Scales given history of breast cancer. DEXA; nml 2010, low risk, no need for further until 62 yr old.

## 2013-09-30 NOTE — Patient Instructions (Signed)
Stop by the lab on way out.  Work on exercise, weight loss, healthy eating habits.

## 2013-10-02 MED ORDER — PRAVASTATIN SODIUM 20 MG PO TABS: 20.0000 mg | ORAL_TABLET | Freq: Every day | ORAL | Status: DC

## 2013-10-02 NOTE — Addendum Note (Signed)
Addended by: Damita Lack on: 10/02/2013 03:11 PM   Modules accepted: Orders

## 2014-07-17 ENCOUNTER — Other Ambulatory Visit: Payer: Self-pay | Admitting: Family Medicine

## 2014-07-17 DIAGNOSIS — Z853 Personal history of malignant neoplasm of breast: Secondary | ICD-10-CM

## 2014-07-20 ENCOUNTER — Ambulatory Visit (INDEPENDENT_AMBULATORY_CARE_PROVIDER_SITE_OTHER): Payer: BC Managed Care – PPO | Admitting: Family Medicine

## 2014-07-20 ENCOUNTER — Encounter: Payer: Self-pay | Admitting: Family Medicine

## 2014-07-20 ENCOUNTER — Encounter (INDEPENDENT_AMBULATORY_CARE_PROVIDER_SITE_OTHER): Payer: Self-pay

## 2014-07-20 VITALS — BP 130/84 | HR 82 | Temp 98.6°F | Ht 65.0 in | Wt 194.5 lb

## 2014-07-20 DIAGNOSIS — M25569 Pain in unspecified knee: Secondary | ICD-10-CM

## 2014-07-20 DIAGNOSIS — M25561 Pain in right knee: Secondary | ICD-10-CM

## 2014-07-20 NOTE — Progress Notes (Signed)
Pre visit review using our clinic review tool, if applicable. No additional management support is needed unless otherwise documented below in the visit note. 

## 2014-07-20 NOTE — Progress Notes (Signed)
   07/20/2014    ID: Bridget Cummings   MRN: [id number]  DOB: 11/28/1951  Primary Physician:  Eliezer Lofts, MD  Chief Complaint: Knee Pain  Subjective:   History of Present Illness:  This 63 y.o. female patient presents with 14 day h/o R sided knee pain after getting hurt on walk. ? audible pop was heard. The patient has not had an effusion. No symptomatic giving-way. No mechanical clicking. Joint has not locked up. Patient has been able to walk but is limping - now better. The patient does not have pain going up and down stairs or rising from a seated position.   1 week before last, felt better, was hurting and hurt really bad. Was walking her dog and her knee gave way. Used dog like cane and had to get up some steps. 2 weeks ago. Hurts in the back. Heard something.   Pain location: postero-medial Prior Knee Surgery: none Current pain meds: none Bracing: none  The PMH, PSH, Social History, Family History, Medications, and allergies have been reviewed in [facility], and have been updated if relevant.  ROS: no acute illness or fever MSK: above GI: tol po intake without nausea or vomitting. Neuro: no numbness, tingling, or radiculopathy O/w per hpi  Objective:   PHYSICAL EXAM  Blood pressure 130/84, pulse 82, temperature 98.6 F (37 C), temperature source Oral, height 5\' 5"  (1.651 m), weight 194 lb 8 oz (88.225 kg).  GEN: Well-developed,well-nourished,in no acute distress; alert,appropriate and cooperative throughout examination HEENT: Normocephalic and atraumatic without obvious abnormalities. Ears, externally no deformities PULM: Breathing comfortably in no respiratory distress EXT: No clubbing, cyanosis, or edema PSYCH: Normally interactive. Cooperative during the interview. Pleasant. Friendly and conversant. Not anxious or depressed appearing. Normal, full affect.  R KNEE Gait: Normal heel toe pattern ROM: WNL Effusion: neg Echymosis or edema: none Patellar tendon  NT Painful PLICA: neg Patellar grind: negative Medial and lateral patellar facet loading: negative medial and lateral joint lines: mild medial joint line pain Mcmurray's neg Flexion-pinch neg Varus and valgus stress: stable Lachman: neg Ant and Post drawer: neg Hip abduction, IR, ER: WNL Hip flexion str: 5/5 Hip abd: 5/5 Quad: 5/5 VMO atrophy:No Hamstring concentric and eccentric: 5/5  Radiology: No results found.  Assessment & Plan:   Knee pain, right  Mostly resolved now, 2 weeks conservative care. Would only follow up prn. Cont ice and prn tylenol. Benign exam  Signed,  Frederico Hamman T. Verle Brillhart, MD, [facility] [place], [address] Phone: [phone] Fax: [phone]   Discontinued Medications   No medications on file   Modified Medications   No medications on file

## 2014-09-28 ENCOUNTER — Encounter (INDEPENDENT_AMBULATORY_CARE_PROVIDER_SITE_OTHER): Payer: Self-pay

## 2014-09-28 ENCOUNTER — Ambulatory Visit
Admission: RE | Admit: 2014-09-28 | Discharge: 2014-09-28 | Disposition: A | Discharge status: Home / Self Care | Payer: BC Managed Care – PPO | Source: Ambulatory Visit | Attending: Family Medicine | Admitting: Family Medicine

## 2014-09-28 DIAGNOSIS — Z853 Personal history of malignant neoplasm of breast: Secondary | ICD-10-CM

## 2014-09-30 ENCOUNTER — Other Ambulatory Visit: Payer: Self-pay

## 2014-09-30 MED ORDER — PRAVASTATIN SODIUM 20 MG PO TABS
20.0000 mg | ORAL_TABLET | Freq: Every day | ORAL | Status: DC
Start: 2014-09-30 — End: 2014-12-17

## 2014-09-30 NOTE — Telephone Encounter (Signed)
Pt had CPX 10/02/14 [place] and rescheduled CPX for 1st available 12/17/2014. Pt request refill of pravastatin to [facility] until 12/2014 appt. Advised done.

## 2014-10-02 ENCOUNTER — Encounter: Payer: BC Managed Care – PPO | Admitting: Family Medicine

## 2014-10-27 ENCOUNTER — Encounter: Payer: Self-pay | Admitting: Family Medicine

## 2014-12-17 ENCOUNTER — Ambulatory Visit (INDEPENDENT_AMBULATORY_CARE_PROVIDER_SITE_OTHER): Payer: BLUE CROSS/BLUE SHIELD | Admitting: Family Medicine

## 2014-12-17 ENCOUNTER — Encounter: Payer: Self-pay | Admitting: Family Medicine

## 2014-12-17 VITALS — BP 120/80 | HR 76 | Temp 98.5°F | Ht 65.5 in | Wt 156.8 lb

## 2014-12-17 DIAGNOSIS — E785 Hyperlipidemia, unspecified: Secondary | ICD-10-CM

## 2014-12-17 DIAGNOSIS — Z0000 Encounter for general adult medical examination without abnormal findings: Secondary | ICD-10-CM

## 2014-12-17 LAB — COMPREHENSIVE METABOLIC PANEL
ALT: 16 U/L (ref 0–35)
AST: 21 U/L (ref 0–37)
Albumin: 4 g/dL (ref 3.5–5.2)
Alkaline Phosphatase: 83 U/L (ref 39–117)
BUN: 17 mg/dL (ref 6–23)
CO2: 29 mEq/L (ref 19–32)
Calcium: 9.1 mg/dL (ref 8.4–10.5)
Chloride: 106 mEq/L (ref 96–112)
Creatinine, Ser: 0.91 mg/dL (ref 0.40–1.20)
GFR: 66.47 mL/min (ref >60.00)
Glucose, Bld: 100 mg/dL — ABNORMAL HIGH (ref 70–99)
Potassium: 4.2 mEq/L (ref 3.5–5.1)
Sodium: 140 mEq/L (ref 135–145)
Total Bilirubin: 0.8 mg/dL (ref 0.2–1.2)
Total Protein: 6.9 g/dL (ref 6.0–8.3)

## 2014-12-17 LAB — LIPID PANEL
Cholesterol: 165 mg/dL (ref 0–200)
HDL: 47.4 mg/dL (ref >39.00)
LDL Cholesterol: 103 mg/dL — ABNORMAL HIGH (ref 0–99)
NonHDL: 117.6
Total CHOL/HDL Ratio: 3
Triglycerides: 75 mg/dL (ref 0.0–149.0)
VLDL: 15 mg/dL (ref 0.0–40.0)

## 2014-12-17 MED ORDER — PRAVASTATIN SODIUM 20 MG PO TABS: 20.0000 mg | ORAL_TABLET | Freq: Every day | ORAL | Status: DC

## 2014-12-17 NOTE — Progress Notes (Signed)
Pre visit review using our clinic review tool, if applicable. No additional management support is needed unless otherwise documented below in the visit note. 

## 2014-12-17 NOTE — Patient Instructions (Signed)
Stop at lab on way out.

## 2014-12-17 NOTE — Assessment & Plan Note (Signed)
Due for re-eval. 

## 2014-12-17 NOTE — Progress Notes (Signed)
The patient is here for annual wellness exam and preventative care.   Elevated Cholesterol:Due for re-eval  on pravastatin 20 mg.  Lab Results  Component Value Date   CHOL 210* 09/30/2013   HDL 55.70 09/30/2013   LDLCALC 98 08/24/2008   LDLDIRECT 140.2 09/30/2013   TRIG 142.0 09/30/2013   CHOLHDL 4 09/30/2013   No myalgia, no SE.  Exercise:2-3 miles a day, walking/biking.  Diet; healthy  BP well controlled. BP Readings from Last 3 Encounters:  12/17/14 120/80  07/20/14 130/84  09/30/13 124/80    Review of Systems  Constitutional: Negative for fever, fatigue and unexpected weight change.  HENT: Negative for ear pain, congestion, sore throat, sneezing, trouble swallowing and sinus pressure.  Eyes: Negative for pain and itching.  Respiratory: Negative for cough, shortness of breath and wheezing.  Cardiovascular: Negative for chest pain, palpitations and leg swelling.  Gastrointestinal: Positive for constipation. Negative for nausea, abdominal pain, diarrhea and blood in stool.  Well controlled on miralax.  Genitourinary: Negative for dysuria, hematuria, vaginal discharge, difficulty urinating and menstrual problem.  Skin: Negative for rash.  Neurological: Negative for syncope, weakness, light-headedness, numbness and headaches.  Psychiatric/Behavioral: Negative for confusion and dysphoric mood. The patient is not nervous/anxious.   Objective:   Physical Exam  Constitutional: Vital signs are normal. She appears well-developed and well-nourished. She is cooperative. Non-toxic appearance. She does not appear ill. No distress.  HENT:  Head: Normocephalic.  Right Ear: Hearing, tympanic membrane, external ear and ear canal normal.  Left Ear: Hearing, tympanic membrane, external ear and ear canal normal.  Nose: Nose normal.  Eyes: Conjunctivae normal, EOM and lids are normal. Pupils are equal, round, and reactive to light. No foreign bodies found.  Neck: Trachea  normal and normal range of motion. Neck supple. Carotid bruit is not present. No mass and no thyromegaly present.  Cardiovascular: Normal rate, regular rhythm, S1 normal, S2 normal, normal heart sounds and intact distal pulses. Exam reveals no gallop.  No murmur heard.  Pulmonary/Chest: Effort normal and breath sounds normal. No respiratory distress. She has no wheezes. She has no rhonchi. She has no rales.  Abdominal: Soft. Normal appearance and bowel sounds are normal. She exhibits no distension, no fluid wave, no abdominal bruit and no mass. There is no hepatosplenomegaly. There is no tenderness. There is no rebound, no guarding and no CVA tenderness. No hernia.  Genitourinary:  NO DVE/PAP PERFORMED. Left breast scar tissue superiorly at site of lupectomy. Inverted nipples B.  Lymphadenopathy:  She has no cervical adenopathy.  She has no axillary adenopathy.  Neurological: She is alert. She has normal strength. No cranial nerve deficit or sensory deficit.  Skin: Skin is warm, dry and intact. No rash noted.  Psychiatric: Her speech is normal and behavior is normal. Judgment normal. Her mood appears not anxious. Cognition and memory are normal. She does not exhibit a depressed mood.  Assessment & Plan:   The patient's preventative maintenance and recommended screening tests for an annual wellness exam were reviewed in full today.  Brought up to date unless services declined.  Counselled on the importance of diet, exercise, and its role in overall health and mortality.  The patient's FH and SH was reviewed, including their home life, tobacco status, and drug and alcohol status.   Vaccines: uptodate.  Diagnostic mammogram...10/2015nml  PAP/DVE: no ovaries due to hx of breast cancer, has uterus. Perform DVE yearly on q3 year Pap schedule, last done 2012, next due 2015.  Colonoscopy  10/2014 Dr. Earlean Shawl, repeat in 5 years.  She has had 3 hemorrhoid banding. DEXA; nml 2010, low risk,  no need for further until 64 yr old.

## 2014-12-18 ENCOUNTER — Encounter: Payer: Self-pay | Admitting: *Deleted

## 2015-08-30 ENCOUNTER — Other Ambulatory Visit: Payer: Self-pay

## 2015-08-30 DIAGNOSIS — Z1231 Encounter for screening mammogram for malignant neoplasm of breast: Secondary | ICD-10-CM

## 2015-09-30 ENCOUNTER — Ambulatory Visit
Admission: RE | Admit: 2015-09-30 | Discharge: 2015-09-30 | Disposition: A | Discharge status: Home / Self Care | Payer: BLUE CROSS/BLUE SHIELD | Source: Ambulatory Visit

## 2015-09-30 DIAGNOSIS — Z1231 Encounter for screening mammogram for malignant neoplasm of breast: Secondary | ICD-10-CM

## 2015-11-09 ENCOUNTER — Other Ambulatory Visit: Payer: Self-pay | Admitting: *Deleted

## 2015-11-09 MED ORDER — PRAVASTATIN SODIUM 20 MG PO TABS: 20.0000 mg | ORAL_TABLET | Freq: Every day | ORAL | Status: DC

## 2016-02-10 ENCOUNTER — Other Ambulatory Visit: Payer: Self-pay | Admitting: *Deleted

## 2016-02-10 MED ORDER — PRAVASTATIN SODIUM 20 MG PO TABS: 20.0000 mg | ORAL_TABLET | Freq: Every day | ORAL | Status: DC

## 2016-02-15 ENCOUNTER — Other Ambulatory Visit (HOSPITAL_COMMUNITY)
Admission: RE | Admit: 2016-02-15 | Discharge: 2016-02-15 | Disposition: A | Discharge status: Home / Self Care | Payer: BLUE CROSS/BLUE SHIELD | Source: Ambulatory Visit | Attending: Family Medicine | Admitting: Family Medicine

## 2016-02-15 ENCOUNTER — Ambulatory Visit (INDEPENDENT_AMBULATORY_CARE_PROVIDER_SITE_OTHER): Payer: BLUE CROSS/BLUE SHIELD | Admitting: Family Medicine

## 2016-02-15 ENCOUNTER — Encounter: Payer: Self-pay | Admitting: Family Medicine

## 2016-02-15 VITALS — BP 128/70 | HR 79 | Temp 97.5°F | Ht 65.5 in | Wt 178.5 lb

## 2016-02-15 DIAGNOSIS — Z01419 Encounter for gynecological examination (general) (routine) without abnormal findings: Secondary | ICD-10-CM | POA: Insufficient documentation

## 2016-02-15 DIAGNOSIS — Z124 Encounter for screening for malignant neoplasm of cervix: Secondary | ICD-10-CM | POA: Diagnosis not present

## 2016-02-15 DIAGNOSIS — Z0000 Encounter for general adult medical examination without abnormal findings: Secondary | ICD-10-CM | POA: Diagnosis not present

## 2016-02-15 DIAGNOSIS — E785 Hyperlipidemia, unspecified: Secondary | ICD-10-CM

## 2016-02-15 DIAGNOSIS — Z1151 Encounter for screening for human papillomavirus (HPV): Secondary | ICD-10-CM | POA: Diagnosis present

## 2016-02-15 LAB — LIPID PANEL
Cholesterol: 211 mg/dL — ABNORMAL HIGH (ref 0–200)
HDL: 63.7 mg/dL (ref >39.00)
LDL Cholesterol: 125 mg/dL — ABNORMAL HIGH (ref 0–99)
NonHDL: 147.55
Total CHOL/HDL Ratio: 3
Triglycerides: 115 mg/dL (ref 0.0–149.0)
VLDL: 23 mg/dL (ref 0.0–40.0)

## 2016-02-15 LAB — COMPREHENSIVE METABOLIC PANEL
ALT: 18 U/L (ref 0–35)
AST: 23 U/L (ref 0–37)
Albumin: 4.2 g/dL (ref 3.5–5.2)
Alkaline Phosphatase: 71 U/L (ref 39–117)
BUN: 16 mg/dL (ref 6–23)
CO2: 28 mEq/L (ref 19–32)
Calcium: 9.4 mg/dL (ref 8.4–10.5)
Chloride: 103 mEq/L (ref 96–112)
Creatinine, Ser: 0.96 mg/dL (ref 0.40–1.20)
GFR: 62.26 mL/min (ref >60.00)
Glucose, Bld: 103 mg/dL — ABNORMAL HIGH (ref 70–99)
Potassium: 3.9 mEq/L (ref 3.5–5.1)
Sodium: 140 mEq/L (ref 135–145)
Total Bilirubin: 0.7 mg/dL (ref 0.2–1.2)
Total Protein: 7 g/dL (ref 6.0–8.3)

## 2016-02-15 MED ORDER — PRAVASTATIN SODIUM 40 MG PO TABS: 20.0000 mg | ORAL_TABLET | Freq: Every day | ORAL | Status: DC

## 2016-02-15 NOTE — Progress Notes (Signed)
The patient is here for annual wellness exam and preventative care.   Elevated Cholesterol:Due for re-eval on pravastatin 20 mg.  Lab Results  Component Value Date   CHOL 165 12/17/2014   HDL 47.40 12/17/2014   LDLCALC 103* 12/17/2014   LDLDIRECT 140.2 09/30/2013   TRIG 75.0 12/17/2014   CHOLHDL 3 12/17/2014  No myalgia, no SE. Exercise:2-3 miles a day, walking/biking. Diet: healthy  BP well controlled. BP Readings from Last 3 Encounters:  02/15/16 128/70  12/17/14 120/80  07/20/14 130/84   Review of Systems  Constitutional: Negative for fever, fatigue and unexpected weight change.  HENT: Negative for ear pain, congestion, sore throat, sneezing, trouble swallowing and sinus pressure.  Eyes: Negative for pain and itching.  Respiratory: Negative for cough, shortness of breath and wheezing.  Cardiovascular: Negative for chest pain, palpitations and leg swelling.  Gastrointestinal: Positive for constipation. Negative for nausea, abdominal pain, diarrhea and blood in stool.  Well controlled on miralax.  Genitourinary: Negative for dysuria, hematuria, vaginal discharge, difficulty urinating and menstrual problem.  Skin: Negative for rash.  Neurological: Negative for syncope, weakness, light-headedness, numbness and headaches.  Psychiatric/Behavioral: Negative for confusion and dysphoric mood. The patient is not nervous/anxious.   Objective:   Physical Exam  Constitutional: Vital signs are normal. She appears well-developed and well-nourished. She is cooperative. Non-toxic appearance. She does not appear ill. No distress.  HENT:  Head: Normocephalic.  Right Ear: Hearing, tympanic membrane, external ear and ear canal normal.  Left Ear: Hearing, tympanic membrane, external ear and ear canal normal.  Nose: Nose normal.  Eyes: Conjunctivae normal, EOM and lids are normal. Pupils are equal, round, and reactive to light. No foreign bodies found.  Neck: Trachea  normal and normal range of motion. Neck supple. Carotid bruit is not present. No mass and no thyromegaly present.  Cardiovascular: Normal rate, regular rhythm, S1 normal, S2 normal, normal heart sounds and intact distal pulses. Exam reveals no gallop.  No murmur heard.  Pulmonary/Chest: Effort normal and breath sounds normal. No respiratory distress. She has no wheezes. She has no rhonchi. She has no rales.  Abdominal: Soft. Normal appearance and bowel sounds are normal. She exhibits no distension, no fluid wave, no abdominal bruit and no mass. There is no hepatosplenomegaly. There is no tenderness. There is no rebound, no guarding and no CVA tenderness. No hernia.  Genitourinary: Normal introitus for age, no external lesions, no vaginal discharge, mucosa pink and moist, no vaginal or cervical lesions, no vaginal atrophy, no friaility or hemorrhage, normal uterus size and position, no adnexal masses or tenderness.  Chaperoned exam.  Left breast scar tissue superiorly at site of lupectomy. Inverted nipples B.  Lymphadenopathy:  She has no cervical adenopathy.  She has no axillary adenopathy.  Neurological: She is alert. She has normal strength. No cranial nerve deficit or sensory deficit.  Skin: Skin is warm, dry and intact. No rash noted.  Psychiatric: Her speech is normal and behavior is normal. Judgment normal. Her mood appears not anxious. Cognition and memory are normal. She does not exhibit a depressed mood.  Assessment & Plan:   The patient's preventative maintenance and recommended screening tests for an annual wellness exam were reviewed in full today.  Brought up to date unless services declined.  Counselled on the importance of diet, exercise, and its role in overall health and mortality.  The patient's FH and SH was reviewed, including their home life, tobacco status, and drug and alcohol status.   Vaccines: uptodate.  screening mammogram...09/2015 nml, hx of breast  cancer PAP/DVE: no ovaries due to hx of breast cancer, has uterus. Perform DVE yearly on q3 year Pap schedule, last done 2012 Colonoscopy 10/2014 Dr. Earlean Shawl, repeat in 5 years. She has had 3 hemorrhoid banding. DEXA; nml 2010, low risk, no need for further until 65 yr old. Refused HIV testing.  Hep C: neg

## 2016-02-15 NOTE — Patient Instructions (Addendum)
Stop at lab on way out for fasting labs. Keep working on healthy eating and regular exercise.

## 2016-02-15 NOTE — Addendum Note (Signed)
Addended by: Pilar Grammes on: 02/15/2016 12:20 PM   Modules accepted: Orders

## 2016-02-15 NOTE — Addendum Note (Signed)
Addended by: Ellamae Sia on: 02/15/2016 12:31 PM   Modules accepted: Orders

## 2016-02-17 ENCOUNTER — Encounter: Payer: Self-pay | Admitting: *Deleted

## 2016-02-17 LAB — CYTOLOGY - PAP

## 2016-02-21 ENCOUNTER — Encounter: Payer: Self-pay | Admitting: *Deleted

## 2016-06-16 ENCOUNTER — Encounter: Payer: Self-pay | Admitting: Genetic Counselor

## 2016-09-04 ENCOUNTER — Other Ambulatory Visit: Payer: Self-pay | Admitting: Family Medicine

## 2016-09-04 DIAGNOSIS — Z1231 Encounter for screening mammogram for malignant neoplasm of breast: Secondary | ICD-10-CM

## 2016-10-02 ENCOUNTER — Ambulatory Visit
Admission: RE | Admit: 2016-10-02 | Discharge: 2016-10-02 | Disposition: A | Discharge status: Home / Self Care | Payer: BLUE CROSS/BLUE SHIELD | Source: Ambulatory Visit | Attending: Family Medicine | Admitting: Family Medicine

## 2016-10-02 DIAGNOSIS — Z1231 Encounter for screening mammogram for malignant neoplasm of breast: Secondary | ICD-10-CM

## 2017-02-20 ENCOUNTER — Encounter (INDEPENDENT_AMBULATORY_CARE_PROVIDER_SITE_OTHER): Payer: Self-pay

## 2017-02-20 ENCOUNTER — Encounter: Payer: Self-pay | Admitting: Family Medicine

## 2017-02-20 ENCOUNTER — Ambulatory Visit (INDEPENDENT_AMBULATORY_CARE_PROVIDER_SITE_OTHER): Payer: BLUE CROSS/BLUE SHIELD | Admitting: Family Medicine

## 2017-02-20 VITALS — BP 149/82 | HR 74 | Temp 98.3°F | Ht 65.5 in | Wt 182.5 lb

## 2017-02-20 DIAGNOSIS — E785 Hyperlipidemia, unspecified: Secondary | ICD-10-CM

## 2017-02-20 DIAGNOSIS — Z0000 Encounter for general adult medical examination without abnormal findings: Secondary | ICD-10-CM

## 2017-02-20 DIAGNOSIS — Z853 Personal history of malignant neoplasm of breast: Secondary | ICD-10-CM

## 2017-02-20 DIAGNOSIS — R21 Rash and other nonspecific skin eruption: Secondary | ICD-10-CM

## 2017-02-20 LAB — COMPREHENSIVE METABOLIC PANEL
ALT: 19 U/L (ref 0–35)
AST: 22 U/L (ref 0–37)
Albumin: 4.3 g/dL (ref 3.5–5.2)
Alkaline Phosphatase: 76 U/L (ref 39–117)
BUN: 19 mg/dL (ref 6–23)
CO2: 28 mEq/L (ref 19–32)
Calcium: 9.6 mg/dL (ref 8.4–10.5)
Chloride: 104 mEq/L (ref 96–112)
Creatinine, Ser: 0.98 mg/dL (ref 0.40–1.20)
GFR: 60.6 mL/min (ref >60.00)
Glucose, Bld: 102 mg/dL — ABNORMAL HIGH (ref 70–99)
Potassium: 4.2 mEq/L (ref 3.5–5.1)
Sodium: 140 mEq/L (ref 135–145)
Total Bilirubin: 0.9 mg/dL (ref 0.2–1.2)
Total Protein: 7.2 g/dL (ref 6.0–8.3)

## 2017-02-20 LAB — LIPID PANEL
Cholesterol: 192 mg/dL (ref 0–200)
HDL: 56.2 mg/dL (ref >39.00)
LDL Cholesterol: 116 mg/dL — ABNORMAL HIGH (ref 0–99)
NonHDL: 136.29
Total CHOL/HDL Ratio: 3
Triglycerides: 101 mg/dL (ref 0.0–149.0)
VLDL: 20.2 mg/dL (ref 0.0–40.0)

## 2017-02-20 MED ORDER — PRAVASTATIN SODIUM 20 MG PO TABS: 20.0000 mg | ORAL_TABLET | Freq: Every day | ORAL | 3 refills | Status: DC

## 2017-02-20 MED ORDER — TRIAMCINOLONE ACETONIDE 0.5 % EX CREA: 1.0000 "application " | TOPICAL_CREAM | Freq: Two times a day (BID) | CUTANEOUS | 0 refills | Status: AC

## 2017-02-20 NOTE — Addendum Note (Signed)
Addended by: Eliezer Lofts E on: 02/20/2017 10:59 AM   Modules accepted: Orders

## 2017-02-20 NOTE — Progress Notes (Signed)
Subjective:    Patient ID: Bridget Cummings, female    DOB: 05/03/1951, 66 y.o.   MRN: [id number]  HPI The patient is here for annual wellness exam and preventative care.     Due for lab check on cholesterol.   She recently had a tooth extracted yesterday and has some tooth pain.  BP has been nml prior to this.  BP Readings from Last 3 Encounters:  02/20/17 (!) 149/82  02/15/16 128/70  12/17/14 120/80   Previously BP well controlled.  Wt Readings from Last 3 Encounters:  02/20/17 182 lb 8 oz (82.8 kg)  02/15/16 178 lb 8 oz (81 kg)  12/17/14 156 lb 12 oz (71.1 kg)   Exercise: off and on.. Trying to walk daily  Diet: low carb. Body mass index is 29.91 kg/m.    She has noted a spot on her back.. No change, no bleeding, no pain.. She scratches it a lot. No fam hx of melanoma. Erythematous rash on chest, itchy x several months. Uses bath and body work scented lotion.   Social History /Family History/Past Medical History reviewed and updated if needed. Blood pressure (!) 149/82, pulse 74, temperature 98.3 F (36.8 C), temperature source Oral, height 5' 5.5" (1.664 m), weight 182 lb 8 oz (82.8 kg).  Review of Systems  Constitutional: Negative for fatigue, fever and unexpected weight change.  HENT: Negative for congestion, ear pain, sinus pressure, sneezing, sore throat and trouble swallowing.   Eyes: Negative for pain and itching.  Respiratory: Negative for cough, shortness of breath and wheezing.   Cardiovascular: Negative for chest pain, palpitations and leg swelling.  Gastrointestinal: Negative for abdominal pain, blood in stool, constipation, diarrhea and nausea.  Genitourinary: Negative for difficulty urinating, dysuria, hematuria, menstrual problem and vaginal discharge.  Skin: Negative for rash.  Neurological: Negative for syncope, weakness, light-headedness, numbness and headaches.  Psychiatric/Behavioral: Negative for confusion and dysphoric mood. The patient is  not nervous/anxious.        Objective:   Physical Exam  Constitutional: Vital signs are normal. She appears well-developed and well-nourished. She is cooperative.  Non-toxic appearance. She does not appear ill. No distress.  HENT:  Head: Normocephalic.  Right Ear: Hearing, tympanic membrane, external ear and ear canal normal.  Left Ear: Hearing, tympanic membrane, external ear and ear canal normal.  Nose: Nose normal.  Eyes: Conjunctivae, EOM and lids are normal. Pupils are equal, round, and reactive to light. Lids are everted and swept, no foreign bodies found.  Neck: Trachea normal and normal range of motion. Neck supple. Carotid bruit is not present. No thyroid mass and no thyromegaly present.  Cardiovascular: Normal rate, regular rhythm, S1 normal, S2 normal, normal heart sounds and intact distal pulses.  Exam reveals no gallop.   No murmur heard. Pulmonary/Chest: Effort normal and breath sounds normal. No respiratory distress. She has no wheezes. She has no rhonchi. She has no rales.  Abdominal: Soft. Normal appearance and bowel sounds are normal. She exhibits no distension, no fluid wave, no abdominal bruit and no mass. There is no hepatosplenomegaly. There is no tenderness. There is no rebound, no guarding and no CVA tenderness. No hernia.  Genitourinary: No breast swelling, tenderness, discharge or bleeding.  Lymphadenopathy:    She has no cervical adenopathy.    She has no axillary adenopathy.  Neurological: She is alert. She has normal strength. No cranial nerve deficit or sensory deficit.  Skin: Skin is warm, dry and intact. No rash noted.  Erythematous  nodules on chest. excoriations.  Psychiatric: Her speech is normal and behavior is normal. Judgment normal. Her mood appears not anxious. Cognition and memory are normal. She does not exhibit a depressed mood.          Assessment & Plan:  The patient's preventative maintenance and recommended screening tests for an annual  wellness exam were reviewed in full today. Brought up to date unless services declined.  Counselled on the importance of diet, exercise, and its role in overall health and mortality. The patient's FH and SH was reviewed, including their home life, tobacco status, and drug and alcohol status.   Vaccines: uptodate.  screening mammogram...09/2016 nml, hx of breast cancer PAP/DVE: no ovaries due to hx of breast cancer, has uterus. On q5 year Pap schedule, last done 2017 Colonoscopy 10/2014 Dr. Earlean Shawl, repeat in 5 years. She has had 3 hemorrhoid banding. DEXA; nml 2010, low risk, no need for further until 66 yr old. Refused HIV testing.  Hep C: neg

## 2017-02-20 NOTE — Assessment & Plan Note (Signed)
Most consistent with allergic dermatitis. Treat with topical steroid. Stop bath and body works lotion. Change to hypoallergenic lotion.

## 2017-02-20 NOTE — Assessment & Plan Note (Signed)
Stabel on mamograms. Nml breast exam today.

## 2017-02-20 NOTE — Progress Notes (Signed)
Pre visit review using our clinic review tool, if applicable. No additional management support is needed unless otherwise documented below in the visit note. 

## 2017-02-20 NOTE — Patient Instructions (Addendum)
Please stop at the lab to set up to have labs drawn. Treat rash with topical steroid. Stop bath and body works lotion. Change to hypoallergenic lotion.

## 2017-02-20 NOTE — Assessment & Plan Note (Signed)
Encouraged exercise, weight loss, healthy eating habits. Due for eval today.

## 2017-02-21 ENCOUNTER — Encounter: Payer: Self-pay | Admitting: *Deleted

## 2017-08-24 ENCOUNTER — Other Ambulatory Visit: Payer: Self-pay | Admitting: Family Medicine

## 2017-08-24 DIAGNOSIS — Z1231 Encounter for screening mammogram for malignant neoplasm of breast: Secondary | ICD-10-CM

## 2017-10-03 ENCOUNTER — Ambulatory Visit
Admission: RE | Admit: 2017-10-03 | Discharge: 2017-10-03 | Disposition: A | Discharge status: Home / Self Care | Payer: PPO | Source: Ambulatory Visit | Attending: Family Medicine | Admitting: Family Medicine

## 2017-10-03 DIAGNOSIS — Z1231 Encounter for screening mammogram for malignant neoplasm of breast: Secondary | ICD-10-CM | POA: Diagnosis not present

## 2018-01-27 ENCOUNTER — Other Ambulatory Visit: Payer: Self-pay | Admitting: Family Medicine

## 2018-02-22 ENCOUNTER — Ambulatory Visit (INDEPENDENT_AMBULATORY_CARE_PROVIDER_SITE_OTHER): Payer: PPO | Admitting: Family Medicine

## 2018-02-22 ENCOUNTER — Encounter: Payer: Self-pay | Admitting: Family Medicine

## 2018-02-22 VITALS — BP 132/78 | HR 62 | Temp 97.8°F | Ht 65.5 in | Wt 159.8 lb

## 2018-02-22 DIAGNOSIS — E785 Hyperlipidemia, unspecified: Secondary | ICD-10-CM

## 2018-02-22 DIAGNOSIS — Z0000 Encounter for general adult medical examination without abnormal findings: Secondary | ICD-10-CM | POA: Diagnosis not present

## 2018-02-22 DIAGNOSIS — Z853 Personal history of malignant neoplasm of breast: Secondary | ICD-10-CM

## 2018-02-22 DIAGNOSIS — E2839 Other primary ovarian failure: Secondary | ICD-10-CM

## 2018-02-22 LAB — LIPID PANEL
Cholesterol: 194 mg/dL (ref 0–200)
HDL: 59.9 mg/dL (ref >39.00)
LDL Cholesterol: 118 mg/dL — ABNORMAL HIGH (ref 0–99)
NonHDL: 133.78
Total CHOL/HDL Ratio: 3
Triglycerides: 80 mg/dL (ref 0.0–149.0)
VLDL: 16 mg/dL (ref 0.0–40.0)

## 2018-02-22 LAB — COMPREHENSIVE METABOLIC PANEL
ALT: 12 U/L (ref 0–35)
AST: 14 U/L (ref 0–37)
Albumin: 4.1 g/dL (ref 3.5–5.2)
Alkaline Phosphatase: 80 U/L (ref 39–117)
BUN: 18 mg/dL (ref 6–23)
CO2: 27 mEq/L (ref 19–32)
Calcium: 9.4 mg/dL (ref 8.4–10.5)
Chloride: 107 mEq/L (ref 96–112)
Creatinine, Ser: 0.96 mg/dL (ref 0.40–1.20)
GFR: 61.86 mL/min (ref >60.00)
Glucose, Bld: 96 mg/dL (ref 70–99)
Potassium: 4.5 mEq/L (ref 3.5–5.1)
Sodium: 142 mEq/L (ref 135–145)
Total Bilirubin: 0.7 mg/dL (ref 0.2–1.2)
Total Protein: 7.1 g/dL (ref 6.0–8.3)

## 2018-02-22 MED ORDER — PRAVASTATIN SODIUM 20 MG PO TABS: 20.0000 mg | ORAL_TABLET | Freq: Every day | ORAL | 3 refills | Status: DC

## 2018-02-22 MED ORDER — TRIAMCINOLONE ACETONIDE 0.5 % EX CREA: 1.0000 "application " | TOPICAL_CREAM | Freq: Two times a day (BID) | CUTANEOUS | 0 refills | Status: DC

## 2018-02-22 NOTE — Assessment & Plan Note (Signed)
Due for labs on pravastatin.

## 2018-02-22 NOTE — Progress Notes (Signed)
Subjective:    Patient ID: Bridget Cummings, female    DOB: 1951-02-10, 67 y.o.   MRN: [id number]  HPI   The patient presents for  WELCOME to medicare wellness, complete physical and review of chronic health problems. He/She also has the following acute concerns today:  Elevated Cholesterol:  Due for labs. On pravastatin Lab Results  Component Value Date   CHOL 192 02/20/2017   HDL 56.20 02/20/2017   LDLCALC 116 (H) 02/20/2017   LDLDIRECT 140.2 09/30/2013   TRIG 101.0 02/20/2017   CHOLHDL 3 02/20/2017  Using medications without problems: none Muscle aches:  none Diet compliance: healthy diet Exercise: walking 3 miles a day, stationary bike, water aerobic Other complaints:  Body mass index is 26.18 kg/m.     Social History /Family History/Past Medical History reviewed in detail and updated in EMR if needed.  Review of Systems  Constitutional: Negative for fatigue and fever.  HENT: Negative for congestion.   Eyes: Negative for pain.  Respiratory: Negative for cough and shortness of breath.   Cardiovascular: Negative for chest pain, palpitations and leg swelling.  Gastrointestinal: Negative for abdominal pain.  Genitourinary: Negative for dysuria and vaginal bleeding.  Musculoskeletal: Negative for back pain.  Neurological: Negative for syncope, light-headedness and headaches.  Psychiatric/Behavioral: Negative for dysphoric mood.       Objective:   Physical Exam  Constitutional: Vital signs are normal. She appears well-developed and well-nourished. She is cooperative.  Non-toxic appearance. She does not appear ill. No distress.  HENT:  Head: Normocephalic.  Right Ear: Hearing, tympanic membrane, external ear and ear canal normal.  Left Ear: Hearing, tympanic membrane, external ear and ear canal normal.  Nose: Nose normal.  Eyes: Pupils are equal, round, and reactive to light. Conjunctivae, EOM and lids are normal. Lids are everted and swept, no foreign bodies  found.  Neck: Trachea normal and normal range of motion. Neck supple. Carotid bruit is not present. No thyroid mass and no thyromegaly present.  Cardiovascular: Normal rate, regular rhythm, S1 normal, S2 normal, normal heart sounds and intact distal pulses. Exam reveals no gallop.  No murmur heard. Pulmonary/Chest: Effort normal and breath sounds normal. No respiratory distress. She has no wheezes. She has no rhonchi. She has no rales. No breast swelling, tenderness, discharge or bleeding.  Abdominal: Soft. Normal appearance and bowel sounds are normal. She exhibits no distension, no fluid wave, no abdominal bruit and no mass. There is no hepatosplenomegaly. There is no tenderness. There is no rebound, no guarding and no CVA tenderness. No hernia.  Genitourinary: Pelvic exam was performed with patient supine.  Lymphadenopathy:    She has no cervical adenopathy.    She has no axillary adenopathy.  Neurological: She is alert. She has normal strength. No cranial nerve deficit or sensory deficit.  Skin: Skin is warm, dry and intact. No rash noted.  Psychiatric: Her speech is normal and behavior is normal. Judgment normal. Her mood appears not anxious. Cognition and memory are normal. She does not exhibit a depressed mood.          Assessment & Plan:  The patient's preventative maintenance and recommended screening tests for an annual wellness exam were reviewed in full today. Brought up to date unless services declined.  Counselled on the importance of diet, exercise, and its role in overall health and mortality. The patient's FH and SH was reviewed, including their home life, tobacco status, and drug and alcohol status.   Vaccines: uptodate.. Per pt  PNA and flu at [facility] screening mammogram...09/2017 nml, hx of breast cancer PAP/DVE: no ovaries due to hx of breast cancer, has uterus. On q5 year Pap schedule, last done 2017.. Pap no longer indicated. No DVE indicated,  asymptomatic. Colonoscopy 10/2014 Dr. Earlean Shawl, repeat in 5 years. She has had 3 hemorrhoid banding. DEXA; nml 2010, low risk, no need for further until 67 yr old. Refused HIV testing. Hep C: neg

## 2018-02-22 NOTE — Patient Instructions (Addendum)
Please stop at the lab to have labs drawn. Please stop at the front desk to set up referral.  

## 2018-02-22 NOTE — Addendum Note (Signed)
Addended by: Eliezer Lofts E on: 02/22/2018 11:32 AM   Modules accepted: Orders

## 2018-02-26 ENCOUNTER — Encounter: Payer: Self-pay | Admitting: *Deleted

## 2018-02-27 ENCOUNTER — Telehealth: Payer: Self-pay | Admitting: Family Medicine

## 2018-02-27 NOTE — Telephone Encounter (Signed)
Copied from [place] [id number]. Topic: Quick Communication - Lab Results >> Feb 27, 2018  1:44 PM Darl Householder, RMA wrote: Patient is requesting a call back for lab results today

## 2018-02-27 NOTE — Telephone Encounter (Signed)
Lab results discussed with patient over the phone.  Letter was also mailed to patient with her results for her records.

## 2018-02-27 NOTE — Telephone Encounter (Signed)
Please see CRM message below.

## 2018-05-14 ENCOUNTER — Encounter: Payer: Self-pay | Admitting: Family Medicine

## 2018-05-29 ENCOUNTER — Telehealth: Payer: Self-pay | Admitting: Family Medicine

## 2018-05-29 NOTE — Telephone Encounter (Signed)
FYI Dr Diona Browner  I wanted to let you know I have left message 4/3, 4/23, 6/11 and spoke with pt on 6/11 pt stated she will make her own mammogram/bone density.  Pt has not been made as of today 6/26

## 2018-05-30 NOTE — Telephone Encounter (Signed)
Noted  

## 2018-09-05 DIAGNOSIS — H5203 Hypermetropia, bilateral: Secondary | ICD-10-CM | POA: Diagnosis not present

## 2018-10-04 ENCOUNTER — Ambulatory Visit
Admission: RE | Admit: 2018-10-04 | Discharge: 2018-10-04 | Disposition: A | Discharge status: Home / Self Care | Payer: PPO | Source: Ambulatory Visit | Attending: Family Medicine | Admitting: Family Medicine

## 2018-10-04 DIAGNOSIS — Z853 Personal history of malignant neoplasm of breast: Secondary | ICD-10-CM

## 2018-10-04 DIAGNOSIS — Z13820 Encounter for screening for osteoporosis: Secondary | ICD-10-CM | POA: Diagnosis not present

## 2018-10-04 DIAGNOSIS — Z780 Asymptomatic menopausal state: Secondary | ICD-10-CM | POA: Diagnosis not present

## 2018-10-04 DIAGNOSIS — E2839 Other primary ovarian failure: Secondary | ICD-10-CM

## 2018-10-04 DIAGNOSIS — Z1231 Encounter for screening mammogram for malignant neoplasm of breast: Secondary | ICD-10-CM | POA: Diagnosis not present

## 2018-10-04 HISTORY — DX: Personal history of irradiation: Z92.3

## 2018-10-08 ENCOUNTER — Other Ambulatory Visit: Payer: Self-pay | Admitting: Family Medicine

## 2018-10-08 DIAGNOSIS — R928 Other abnormal and inconclusive findings on diagnostic imaging of breast: Secondary | ICD-10-CM

## 2018-10-10 ENCOUNTER — Ambulatory Visit
Admission: RE | Admit: 2018-10-10 | Discharge: 2018-10-10 | Disposition: A | Discharge status: Home / Self Care | Payer: PPO | Source: Ambulatory Visit | Attending: Family Medicine | Admitting: Family Medicine

## 2018-10-10 ENCOUNTER — Other Ambulatory Visit: Payer: Self-pay | Admitting: Family Medicine

## 2018-10-10 DIAGNOSIS — N6310 Unspecified lump in the right breast, unspecified quadrant: Secondary | ICD-10-CM

## 2018-10-10 DIAGNOSIS — R928 Other abnormal and inconclusive findings on diagnostic imaging of breast: Secondary | ICD-10-CM

## 2018-10-14 ENCOUNTER — Ambulatory Visit
Admission: RE | Admit: 2018-10-14 | Discharge: 2018-10-14 | Disposition: A | Discharge status: Home / Self Care | Payer: PPO | Source: Ambulatory Visit | Attending: Family Medicine | Admitting: Family Medicine

## 2018-10-14 ENCOUNTER — Other Ambulatory Visit: Payer: Self-pay | Admitting: Family Medicine

## 2018-10-14 DIAGNOSIS — C50211 Malignant neoplasm of upper-inner quadrant of right female breast: Secondary | ICD-10-CM | POA: Diagnosis not present

## 2018-10-14 DIAGNOSIS — N6310 Unspecified lump in the right breast, unspecified quadrant: Secondary | ICD-10-CM

## 2018-10-14 DIAGNOSIS — N6312 Unspecified lump in the right breast, upper inner quadrant: Secondary | ICD-10-CM | POA: Diagnosis not present

## 2018-10-21 ENCOUNTER — Other Ambulatory Visit: Payer: Self-pay | Admitting: General Surgery

## 2018-10-21 DIAGNOSIS — Z923 Personal history of irradiation: Secondary | ICD-10-CM | POA: Diagnosis not present

## 2018-10-21 DIAGNOSIS — Z9079 Acquired absence of other genital organ(s): Secondary | ICD-10-CM | POA: Diagnosis not present

## 2018-10-21 DIAGNOSIS — Z853 Personal history of malignant neoplasm of breast: Secondary | ICD-10-CM | POA: Diagnosis not present

## 2018-10-21 DIAGNOSIS — C50211 Malignant neoplasm of upper-inner quadrant of right female breast: Secondary | ICD-10-CM | POA: Diagnosis not present

## 2018-10-21 DIAGNOSIS — Z803 Family history of malignant neoplasm of breast: Secondary | ICD-10-CM | POA: Diagnosis not present

## 2018-10-21 DIAGNOSIS — Z90722 Acquired absence of ovaries, bilateral: Secondary | ICD-10-CM | POA: Diagnosis not present

## 2018-10-22 ENCOUNTER — Other Ambulatory Visit: Payer: Self-pay | Admitting: General Surgery

## 2018-10-22 DIAGNOSIS — C50211 Malignant neoplasm of upper-inner quadrant of right female breast: Secondary | ICD-10-CM

## 2018-10-24 ENCOUNTER — Telehealth: Payer: Self-pay | Admitting: Oncology

## 2018-10-24 NOTE — Telephone Encounter (Signed)
Referrals received from Dr. Dalbert Batman for urgent genetics and medonc. Bridget Cummings has been cld and scheduled to see Dr. Jana Hakim on 11/22 at 3pm and for urgent genetics on 11/26 at 3pm. Pt agreed to both appointments.

## 2018-10-25 ENCOUNTER — Inpatient Hospital Stay: Payer: PPO | Attending: Oncology | Admitting: Oncology

## 2018-10-25 ENCOUNTER — Encounter: Payer: Self-pay | Admitting: Oncology

## 2018-10-25 DIAGNOSIS — C50211 Malignant neoplasm of upper-inner quadrant of right female breast: Secondary | ICD-10-CM

## 2018-10-25 DIAGNOSIS — Z7982 Long term (current) use of aspirin: Secondary | ICD-10-CM | POA: Insufficient documentation

## 2018-10-25 DIAGNOSIS — Z90710 Acquired absence of both cervix and uterus: Secondary | ICD-10-CM | POA: Diagnosis not present

## 2018-10-25 DIAGNOSIS — Z923 Personal history of irradiation: Secondary | ICD-10-CM | POA: Insufficient documentation

## 2018-10-25 DIAGNOSIS — C50812 Malignant neoplasm of overlapping sites of left female breast: Secondary | ICD-10-CM | POA: Insufficient documentation

## 2018-10-25 DIAGNOSIS — Z79899 Other long term (current) drug therapy: Secondary | ICD-10-CM

## 2018-10-25 DIAGNOSIS — Z170 Estrogen receptor positive status [ER+]: Secondary | ICD-10-CM | POA: Insufficient documentation

## 2018-10-25 NOTE — Progress Notes (Signed)
[place]  [phone] [phone]     ID: Bridget Cummings DOB: 10-27-1951  MR#: [id number]  EGB#:[id number]  Patient Care Team: Jinny Sanders, MD as PCP - General Magrinat, Virgie Dad, MD as Consulting Physician (Oncology) Fanny Skates, MD as Consulting Physician (General Surgery) Noreene Filbert, MD as Referring Physician (Radiation Oncology) Lloyd Huger, MD as Consulting Physician (Oncology) OTHER MD:  CHIEF COMPLAINT: Estrogen receptor positive breast cancer  CURRENT TREATMENT: Awaiting definitive surgery   HISTORY OF CURRENT ILLNESS: Bridget Cummings has a prior history of breast cancer dating back to 10/16/2007 when she underwent left lumpectomy for a 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  She received external beam radiation and also took tamoxifen for 4 years.    More recently she had routine screening mammography on 10/04/2018 showing a possible abnormality in the right breast. She underwent bilateral diagnostic mammography with tomography and right breast ultrasonography at [facility] on 10/10/2018 showing: Breast density category B.  Suspicious mass within the RIGHT breast at the 1 o'clock axis, 5 cm from the nipple, with indistinct margins, measuring 7 mm by ultrasonography.  The right axilla was unremarkable sonographically  Accordingly on 10/14/2018 she proceeded to biopsy of the right breast area in question. The pathology from this procedure showed ([id number]):   Invasive ductal carcinoma, grade 1, estrogen receptor 100% positive, progesterone receptor 100% positive, both with strong staining intensity, with an MIB-1 of 2% and no HER-2 amplification by immunohistochemistry (0).    The patient's subsequent history is as detailed below.  INTERVAL HISTORY: The patient was evaluated in the multidisciplinary breast cancer clinic on 10/23/2018 accompanied by her husband. Her case  was also presented at the multidisciplinary breast cancer conference on the same day. At that time a preliminary plan was proposed: Genetics testing, possibly Oncotype, most likely antiestrogens   REVIEW OF SYSTEMS: Bridget Cummings reports that for exercise, swims 1 hour a day then bikes 2 miles and walks 2 miles every day. Had ovaries removed 2009 uterus is still there. There were no specific symptoms leading to the original mammogram, which was routinely scheduled. The patient denies unusual headaches, visual changes, nausea, vomiting, stiff neck, dizziness, or gait imbalance. There has been no cough, phlegm production, or pleurisy, no chest pain or pressure, and no change in bowel or bladder habits. The patient denies fever, rash, bleeding, unexplained fatigue or unexplained weight loss. A detailed review of systems was otherwise entirely negative.   PAST MEDICAL HISTORY: Past Medical History:  Diagnosis Date  . Allergy   . Breast cancer ([place]) 2008   left breast  . Hyperlipidemia   . Personal history of radiation therapy     PAST SURGICAL HISTORY: Past Surgical History:  Procedure Laterality Date  . BREAST BIOPSY    . BREAST LUMPECTOMY Left 2008  . FOOT SURGERY    . OVARY REMOVED  2009  . TONSILLECTOMY      FAMILY HISTORY Family History  Problem Relation Age of Onset  . Breast cancer Mother   . Diabetes Father   . Breast cancer Sister    She notes that her father died from at age 1 and was diabetic. Patients' mother died at age 37.  She had breast cancer at age 67.  The patient has 3 brothers and 1 sister.  The sister died of breast cancer, was diagnosed with breast cancer and died at 18.  Patient has maternal aunt and cousin that  both had breast cancer.   GYNECOLOGIC HISTORY:  No LMP recorded. Patient has had a hysterectomy. Menarche: 67 years old Age at first live birth: 67 years old [place] P2 LMP: unsure, about 28 HRT: less than 2 years  Hysterectomy?: no BSO?: Had ovaries  removed February 2009    SOCIAL HISTORY:  Bridget Cummings worked at [employer], but is now retired.  Husband Bridget Cummings works in Occupational hygienist.  Daughter Bridget Cummings lives in [place] and son Bridget Cummings also lives in [place].  The patient has no grandchildren.  She is not a church or tender    ADVANCED DIRECTIVES: Husband is healthcare power of attorney   HEALTH MAINTENANCE: Social History   Tobacco Use  . Smoking status: Never Smoker  . Smokeless tobacco: Never Used  Substance Use Topics  . Alcohol use: No  . Drug use: No     Colonoscopy: Due 2020  PAP: UTD  [facility] T score -0.4   No Known Allergies  Current Outpatient Medications  Medication Sig Dispense Refill  . aspirin 81 MG tablet Take 81 mg by mouth at bedtime.     . Calcium Carb-Cholecalciferol (CALCIUM + VITAMIN D3 PO) Take 2 tablets by mouth daily.    . polyethylene glycol (MIRALAX / GLYCOLAX) packet Take 17 g by mouth 3 (three) times a week.     . pravastatin (PRAVACHOL) 20 MG tablet Take 1 tablet (20 mg total) by mouth daily. 90 tablet 3  . vitamin B-12 (CYANOCOBALAMIN) 1000 MCG tablet Take 1,000 mcg by mouth daily.     No current facility-administered medications for this visit.     OBJECTIVE: Middle-aged white woman who appears younger than stated age  8:   10/25/18 1505  BP: (!) 135/57  Pulse: 85  Resp: 18  Temp: 98.2 F (36.8 C)  SpO2: 97%     Body mass index is 28.76 kg/m.   Wt Readings from Last 3 Encounters:  10/25/18 175 lb 8 oz (79.6 kg)  02/22/18 159 lb 12 oz (72.5 kg)  02/20/17 182 lb 8 oz (82.8 kg)      ECOG FS:0 - Asymptomatic  Ocular: Sclerae unicteric, pupils round and equal Lymphatic: No cervical or supraclavicular adenopathy Lungs no rales or rhonchi Heart regular rate and rhythm Abd soft, nontender, positive bowel sounds MSK no focal spinal tenderness, no joint edema Neuro: non-focal, well-oriented, appropriate affect Breasts: I  do not palpate a mass in either breast.  There are no skin or nipple changes of concern.  Both axillae are benign.   LAB RESULTS:  CMP     Component Value Date/Time   NA 142 02/22/2018 1140   NA 137 03/08/2010 1454   K 4.5 02/22/2018 1140   K 4.2 03/08/2010 1454   CL 107 02/22/2018 1140   CL 102 03/08/2010 1454   CO2 27 02/22/2018 1140   CO2 30 03/08/2010 1454   GLUCOSE 96 02/22/2018 1140   GLUCOSE 162 (H) 03/08/2010 1454   BUN 18 02/22/2018 1140   BUN 16 03/08/2010 1454   CREATININE 0.96 02/22/2018 1140   CREATININE 0.8 03/08/2010 1454   CALCIUM 9.4 02/22/2018 1140   CALCIUM 9.1 03/08/2010 1454   PROT 7.1 02/22/2018 1140   PROT 6.6 03/08/2010 1454   ALBUMIN 4.1 02/22/2018 1140   ALBUMIN 3.4 03/08/2010 1454   AST 14 02/22/2018 1140   AST 27 03/08/2010 1454   ALT 12 02/22/2018 1140   ALT 28 03/08/2010 1454   ALKPHOS  80 02/22/2018 1140   ALKPHOS 69 03/08/2010 1454   BILITOT 0.7 02/22/2018 1140   BILITOT 0.50 03/08/2010 1454   GFRNONAA 59.11 09/13/2010 1202   GFRAA 83 08/24/2008 1200    No results found for: TOTALPROTELP, ALBUMINELP, A1GS, A2GS, BETS, BETA2SER, GAMS, MSPIKE, SPEI  No results found for: Nils Pyle, [facility]  Lab Results  Component Value Date   WBC 6.7 04/26/2012   NEUTROABS 4.2 04/26/2012   HGB 13.8 04/26/2012   HCT 40.9 04/26/2012   MCV 93.3 04/26/2012   PLT 221 04/26/2012    '@LASTCHEMISTRY' @  No results found for: LABCA2  No components found for: DJMEQA834  No results for input(s): INR in the last 168 hours.  No results found for: LABCA2  No results found for: HDQ222  No results found for: LNL892  No results found for: JJH417  No results found for: CA2729  No components found for: HGQUANT  No results found for: CEA1 / No results found for: CEA1   No results found for: AFPTUMOR  No results found for: CHROMOGRNA  No results found for: PSA1  No visits with results within 3 Day(s) from this visit.  Latest  known visit with results is:  Office Visit on 02/22/2018  Component Date Value Ref Range Status  . Cholesterol 02/22/2018 194  0 - 200 mg/dL Final   ATP III Classification       Desirable:  < 200 mg/dL               Borderline High:  200 - 239 mg/dL          High:  > = 240 mg/dL  . Triglycerides 02/22/2018 80.0  0.0 - 149.0 mg/dL Final   Normal:  <150 mg/dLBorderline High:  150 - 199 mg/dL  . HDL 02/22/2018 59.90  >39.00 mg/dL Final  . VLDL 02/22/2018 16.0  0.0 - 40.0 mg/dL Final  . LDL Cholesterol 02/22/2018 118* 0 - 99 mg/dL Final  . Total CHOL/HDL Ratio 02/22/2018 3   Final                  Men          Women1/2 Average Risk     3.4          3.3Average Risk          5.0          4.42X Average Risk          9.6          7.13X Average Risk          15.0          11.0                      . NonHDL 02/22/2018 133.78   Final   NOTE:  Non-HDL goal should be 30 mg/dL higher than patient's LDL goal (i.e. LDL goal of < 70 mg/dL, would have non-HDL goal of < 100 mg/dL)  . Sodium 02/22/2018 142  135 - 145 mEq/L Final  . Potassium 02/22/2018 4.5  3.5 - 5.1 mEq/L Final  . Chloride 02/22/2018 107  96 - 112 mEq/L Final  . CO2 02/22/2018 27  19 - 32 mEq/L Final  . Glucose, Bld 02/22/2018 96  70 - 99 mg/dL Final  . BUN 02/22/2018 18  6 - 23 mg/dL Final  . Creatinine, Ser 02/22/2018 0.96  0.40 - 1.20 mg/dL Final  . Total Bilirubin 02/22/2018 0.7  0.2 - 1.2 mg/dL Final  . Alkaline Phosphatase 02/22/2018 80  39 - 117 U/L Final  . AST 02/22/2018 14  0 - 37 U/L Final  . ALT 02/22/2018 12  0 - 35 U/L Final  . Total Protein 02/22/2018 7.1  6.0 - 8.3 g/dL Final  . Albumin 02/22/2018 4.1  3.5 - 5.2 g/dL Final  . Calcium 02/22/2018 9.4  8.4 - 10.5 mg/dL Final  . GFR 02/22/2018 61.86  >60.00 mL/min Final    (this displays the last labs from the last 3 days)  No results found for: TOTALPROTELP, ALBUMINELP, A1GS, A2GS, BETS, BETA2SER, GAMS, MSPIKE, SPEI (this displays SPEP labs)  No results found for:  KPAFRELGTCHN, LAMBDASER, KAPLAMBRATIO (kappa/lambda light chains)  No results found for: HGBA, HGBA2QUANT, HGBFQUANT, HGBSQUAN (Hemoglobinopathy evaluation)   No results found for: LDH  No results found for: IRON, TIBC, IRONPCTSAT (Iron and TIBC)  No results found for: FERRITIN  Urinalysis    Component Value Date/Time   COLORURINE YELLOW 10/15/2007 0935   APPEARANCEUR CLEAR 10/15/2007 0935   LABSPEC 1.020 10/15/2007 0935   PHURINE 6.0 10/15/2007 0935   GLUCOSEU NEGATIVE 10/15/2007 0935   HGBUR NEGATIVE 10/15/2007 0935   BILIRUBINUR NEGATIVE 10/15/2007 0935   KETONESUR NEGATIVE 10/15/2007 0935   PROTEINUR NEGATIVE 10/15/2007 0935   UROBILINOGEN 0.2 10/15/2007 0935   NITRITE NEGATIVE 10/15/2007 0935   LEUKOCYTESUR SMALL (A) 10/15/2007 0935     STUDIES:  Dg Bone Density  Result Date: 10/04/2018 EXAM: DUAL X-RAY ABSORPTIOMETRY (DXA) FOR BONE MINERAL DENSITY IMPRESSION: Referring Physician:  Eliezer Lofts Your patient completed a BMD test using Lunar IDXA DXA system ( analysis version: 16 ) manufactured by [employer]. Technologist: KT PATIENT: Name: CAREENA, DEGRAFFENREID Patient ID: [id number] Birth Date: [birth year] Height: 65.0 in. Sex: Female Measured: 10/04/2018 Weight: 177.8 lbs. Indications: Bilateral Ovariectomy (65.51), Caucasian, Estrogen Deficient, Height Loss (781.91), Postmenopausal Fractures: Finger Treatments: Calcium (E943.0) ASSESSMENT: The BMD measured at Femur Neck Left is 0.981 g/cm2 with a T-score of -0.4. This patient is considered normal according to [facility]) criteria. There has been no statistically significant change in BMD of left hip since prior exam dated 09/21/2009. The scan quality is good. DXA exam performed on prior Hologic device measured only unilateral hip (Total Mean was not measured) Therefore current or prior Total Mean cannot be compared. Site Region Measured Date Measured Age YA BMD Significant CHANGE T-score AP Spine L1-L4  10[age]04/2018 66.2 0.1 1.193 g/cm2 * AP Spine  L1-L4      09/21/2009    57.2         -0.6    1.111 g/cm2 DualFemur Neck Left  10/04/2018    66.2         -0.4    0.981 g/cm2 DualFemur Neck Left  09/21/2009    57.2         -0.3    0.992 g/cm2 DualFemur Total Mean 10[age]04/2018    66.2         0.0     1.009 g/cm2 World Health Organization [facility]) criteria for post-menopausal, Caucasian Women: Normal       T-score at or above -1 SD Osteopenia   T-score between -1 and -2.5 SD Osteoporosis T-score at or below -2.5 SD RECOMMENDATION: 1. All patients should optimize calcium and vitamin D intake. 2. Consider FDA approved medical therapies in postmenopausal women and men aged 58 years and older, based on the following: a. A hip or vertebral (clinical or morphometric) fracture b. T-  score < or = -2.5 at the femoral neck or spine after appropriate evaluation to exclude secondary causes c. Low bone mass (T-score between -1.0 and -2.5 at the femoral neck or spine) and a 10 year probability of a hip fracture > or = 3% or a 10 year probability of a major osteoporosis-related fracture > or = 20% based on the [place]-adapted WHO algorithm d. Clinician judgment and/or patient preferences may indicate treatment for people with 10-year fracture probabilities above or below these levels FOLLOW-UP: People with diagnosed cases of osteoporosis or at high risk for fracture should have regular bone mineral density tests. For patients eligible for Medicare, routine testing is allowed once every 2 years. The testing frequency can be increased to one year for patients who have rapidly progressing disease, those who are receiving or discontinuing medical therapy to restore bone mass, or have additional risk factors. I have reviewed this report and agree with the above findings. [facility] Radiology Electronically Signed   By: Nolon Nations M.D.   On: 10/04/2018 15:15   Mm Digital Screening Bilateral  Result Date: 10/07/2018 CLINICAL DATA:  Screening.  Prior history of left breast cancer post lumpectomy 2008. EXAM: DIGITAL SCREENING BILATERAL MAMMOGRAM WITH CAD COMPARISON:  Previous exam(s). ACR Breast Density Category b: There are scattered areas of fibroglandular density. FINDINGS: In the right breast, a possible asymmetry in the medial posterior right breast on the CC view warrants further evaluation. In the left breast, no findings suspicious for malignancy. Images were processed with CAD. IMPRESSION: Further evaluation is suggested for possible asymmetry in the right breast. RECOMMENDATION: Diagnostic mammogram and possibly ultrasound of the right breast. (Code:[id number]) The patient will be contacted regarding the findings, and additional imaging will be scheduled. BI-RADS CATEGORY  0: Incomplete. Need additional imaging evaluation and/or prior mammograms for comparison. Electronically Signed   By: Everlean Alstrom M.D.   On: 10/07/2018 09:11   US Breast Ltd Uni Right Inc Axilla  Result Date: 10/10/2018 CLINICAL DATA:  Patient returns today to evaluate a possible RIGHT breast asymmetry questioned on recent screening mammogram. EXAM: DIGITAL DIAGNOSTIC RIGHT MAMMOGRAM WITH CAD AND TOMO ULTRASOUND RIGHT BREAST COMPARISON:  Previous exams including recent screening mammogram dated 10/03/2018 ACR Breast Density Category b: There are scattered areas of fibroglandular density. FINDINGS: On today's additional diagnostic views with 3D tomosynthesis, there is a spiculated mass confirmed within the inner RIGHT breast, at posterior depth, measuring approximately 7 mm greatest dimension, best seen on CC slice 31 and spot compression CC slice 35, with probable correlate on the MLO view within the slightly upper RIGHT breast (MLO slice 23 and true lateral slice 38). Mammographic images were processed with CAD. Targeted ultrasound is performed, showing an irregular hypoechoic mass within the RIGHT breast at the 1 o'clock axis, 5 cm from the nipple, with indistinct  margins, measuring 7 x 4 mm, corresponding to the mammographic finding. RIGHT axilla was evaluated ultrasound showing no enlarged or morphologically abnormal lymph nodes. IMPRESSION: Suspicious mass within the RIGHT breast at the 1 o'clock axis, 5 cm from the nipple, with indistinct margins, measuring 7 mm, corresponding to the spiculated mass seen on mammogram. Ultrasound-guided biopsy is recommended. RECOMMENDATION: 1. Ultrasound-guided biopsy for the suspicious RIGHT breast mass at the 1 o'clock axis, 5 cm from the nipple, measuring 7 mm. 2. Postprocedure mammogram to ensure sonographic and mammographic correspondence. If findings do not correspond, additional stereotactic biopsy would then be recommended. Ultrasound-guided biopsy is scheduled for November 11th. I have discussed the findings  and recommendations with the patient. Results were also provided in writing at the conclusion of the visit. If applicable, a reminder letter will be sent to the patient regarding the next appointment. BI-RADS CATEGORY  4: Suspicious. Electronically Signed   By: Franki Cabot M.D.   On: 10/10/2018 12:50   Mm Diag Breast Tomo Uni Right  Result Date: 10/10/2018 CLINICAL DATA:  Patient returns today to evaluate a possible RIGHT breast asymmetry questioned on recent screening mammogram. EXAM: DIGITAL DIAGNOSTIC RIGHT MAMMOGRAM WITH CAD AND TOMO ULTRASOUND RIGHT BREAST COMPARISON:  Previous exams including recent screening mammogram dated 10/03/2018 ACR Breast Density Category b: There are scattered areas of fibroglandular density. FINDINGS: On today's additional diagnostic views with 3D tomosynthesis, there is a spiculated mass confirmed within the inner RIGHT breast, at posterior depth, measuring approximately 7 mm greatest dimension, best seen on CC slice 31 and spot compression CC slice 35, with probable correlate on the MLO view within the slightly upper RIGHT breast (MLO slice 23 and true lateral slice 38). Mammographic  images were processed with CAD. Targeted ultrasound is performed, showing an irregular hypoechoic mass within the RIGHT breast at the 1 o'clock axis, 5 cm from the nipple, with indistinct margins, measuring 7 x 4 mm, corresponding to the mammographic finding. RIGHT axilla was evaluated ultrasound showing no enlarged or morphologically abnormal lymph nodes. IMPRESSION: Suspicious mass within the RIGHT breast at the 1 o'clock axis, 5 cm from the nipple, with indistinct margins, measuring 7 mm, corresponding to the spiculated mass seen on mammogram. Ultrasound-guided biopsy is recommended. RECOMMENDATION: 1. Ultrasound-guided biopsy for the suspicious RIGHT breast mass at the 1 o'clock axis, 5 cm from the nipple, measuring 7 mm. 2. Postprocedure mammogram to ensure sonographic and mammographic correspondence. If findings do not correspond, additional stereotactic biopsy would then be recommended. Ultrasound-guided biopsy is scheduled for November 11th. I have discussed the findings and recommendations with the patient. Results were also provided in writing at the conclusion of the visit. If applicable, a reminder letter will be sent to the patient regarding the next appointment. BI-RADS CATEGORY  4: Suspicious. Electronically Signed   By: Franki Cabot M.D.   On: 10/10/2018 12:50   Mm Clip Placement Right  Result Date: 10/14/2018 CLINICAL DATA:  Status post ultrasound-guided core biopsy of a right breast mass. EXAM: DIAGNOSTIC RIGHT MAMMOGRAM POST ULTRASOUND BIOPSY COMPARISON:  Previous exam(s). FINDINGS: Mammographic images were obtained following ultrasound guided biopsy of a mass in the 1 o'clock region of the right breast. Initial mammographic images did not show a biopsy clip in the right breast. The patient was brought back to ultrasound and a HydroMARK clip was placed into the biopsied mass in the 1 o'clock region of the right breast. Mammographic images show the HydroMARK clip is in appropriate position in  the upper-inner quadrant of the breast. IMPRESSION: Status post ultrasound-guided core biopsy of the right breast with pathology pending. Final Assessment: Post Procedure Mammograms for Marker Placement Electronically Signed   By: Lillia Mountain M.D.   On: 10/14/2018 16:30   [place] Rt Breast Bx W Loc Dev 1st Lesion Img Bx Spec US Guide  Addendum Date: 10/16/2018   ADDENDUM REPORT: 10/15/2018 15:48 ADDENDUM: Pathology revealed GRADE I INVASIVE DUCTAL CARCINOMA of the Right breast, 1 o'clock. This was found to be concordant by Dr. Lillia Mountain. Pathology results were discussed with the patient by telephone. The patient reported doing well after the biopsy with tenderness at the site. Post biopsy instructions and care were  reviewed and questions were answered. The patient was encouraged to call [facility] for any additional concerns. Surgical consultation has been arranged with Dr. Fanny Skates, per patient request, at [facility] on October 21, 2018. Pathology results reported by Terie Purser, RN on 10/15/2018. Electronically Signed   By: Lillia Mountain M.D.   On: 10/15/2018 15:48   Result Date: 10/16/2018 CLINICAL DATA:  Right breast mass. EXAM: ULTRASOUND GUIDED RIGHT BREAST CORE NEEDLE BIOPSY COMPARISON:  Previous exam(s). FINDINGS: I met with the patient and we discussed the procedure of ultrasound-guided biopsy, including benefits and alternatives. We discussed the high likelihood of a successful procedure. We discussed the risks of the procedure, including infection, bleeding, tissue injury, clip migration, and inadequate sampling. Informed written consent was given. The usual time-out protocol was performed immediately prior to the procedure. Lesion quadrant: Upper inner quadrant (1 o'clock) Using sterile technique and 1% Lidocaine and 1% lidocaine with epinephrine as local anesthetic, under direct ultrasound visualization, a 14 gauge spring-loaded device was used to  perform biopsy of a mass in the 1 o'clock region of the right breast using a medial to lateral approach. At the conclusion of the procedure a ribbon shaped tissue marker clip was deployed into the biopsy cavity. Follow up 2 view mammogram was performed and dictated separately. IMPRESSION: Ultrasound guided biopsy of the right breast. No apparent complications. Electronically Signed: By: Lillia Mountain M.D. On: 10/14/2018 15:37    ELIGIBLE FOR AVAILABLE RESEARCH PROTOCOL: no  ASSESSMENT: 67 y.o. BRCA negative [place], [place] woman  (1) status post left lumpectomy 10/16/2007 for a 1.7 cm ductal carcinoma in situ, estrogen receptor positive  (a) status post adjuvant radiation  (B) status post tamoxifen for 4 years  (2) status post right breast upper inner quadrant biopsy 10/14/2008 for a clinical t1B n0, STAGE ia base of ductal carcinoma, grade 1, estrogen and progesterone receptor strongly positive, HER-2 not amplified, with an MIB-1 of 2%.  (3) genetics testing pending  (4) definitive surgery to follow  PLAN: I spent approximately 60 minutes face to face with [facility] with more than 50% of that time spent in counseling and coordination of care. Specifically we reviewed the biology of the patient's diagnosis and the specifics of her situation.    We discussed the difference between local and systemic therapy. In terms of loco-regional treatment, lumpectomy plus radiation is equivalent to mastectomy as far as survival is concerned. For this reason, and because the cosmetic results are generally superior, we recommend breast conserving surgery.   We then discussed the rationale for systemic therapy. There is some risk that this cancer may have already spread to other parts of her body. Patients frequently ask at this point about bone scans, CAT scans and PET scans to find out if they have occult breast cancer somewhere else. The problem is that in early stage disease we are much more  likely to find false positives then true cancers and this would expose the patient to unnecessary procedures as well as unnecessary radiation. Scans cannot answer the question the patient really would like to know, which is whether she has microscopic disease elsewhere in her body. For those reasons we do not recommend them.  Of course we would proceed to aggressive evaluation of any symptoms that might suggest metastatic disease, but that is not the case here.  Next we went over the options for systemic therapy which are anti-estrogens, anti-HER-2 immunotherapy, and chemotherapy. Makaylynn does not meet  criteria for anti-HER-2 immunotherapy. She is a good candidate for anti-estrogens.  The question of chemotherapy is more complicated. Chemotherapy is most effective in rapidly growing, aggressive tumors. It is much less effective in low-grade, slow growing cancers, like Bridget Cummings 's.  Given the size and uniformly favorable prognostic indicators in this case, I do not feel an Oncotype would be helpful and my recommendation is to forego chemotherapy  We then discussed genetics. In patients who carry a deleterious mutation [for example in a  BRCA gene], the risk of a new breast cancer developing in the future may be sufficiently great that the patient may choose bilateral mastectomies. However if she wishes to keep her breasts in that situation it is safe to do so. That would require intensified screening, which generally means not only yearly mammography but a yearly breast MRI as well.   Bridget Cummings feels strongly that if she carried a deleterious mutation she would still want to keep her breast and therefore there is no need to wait on genetics testing to proceed with surgery as scheduled  She would like to have her treatments and further follow-up in [place].  We are going to refer her to Drs. [place] and [place] regarding that.   Bridget Cummings has a good understanding of the overall plan. She agrees  with it. She knows the goal of treatment in her case is cure.  I am not making a return appointment for her here but she understands I will be glad to see her any point in the future if and when the need arises   Magrinat, Virgie Dad, MD  10/25/18 6:48 PM Medical Oncology and Hematology [facility] [place], [place] [address] Tel. [phone]    Fax. [phone]    I, Samul Dada am acting as scribe for Dr. Virgie Dad. Magrinat.  I, Lurline Del MD, have reviewed the above documentation for accuracy and completeness, and I agree with the above.

## 2018-10-28 ENCOUNTER — Encounter (HOSPITAL_COMMUNITY): Payer: Self-pay

## 2018-10-28 ENCOUNTER — Encounter (HOSPITAL_COMMUNITY)
Admission: RE | Admit: 2018-10-28 | Discharge: 2018-10-28 | Disposition: A | Discharge status: Home / Self Care | Payer: PPO | Source: Ambulatory Visit | Attending: General Surgery | Admitting: General Surgery

## 2018-10-28 ENCOUNTER — Telehealth: Payer: Self-pay | Admitting: Oncology

## 2018-10-28 ENCOUNTER — Other Ambulatory Visit: Payer: Self-pay

## 2018-10-28 DIAGNOSIS — Z01812 Encounter for preprocedural laboratory examination: Secondary | ICD-10-CM | POA: Diagnosis not present

## 2018-10-28 LAB — CBC WITH DIFFERENTIAL/PLATELET
Abs Immature Granulocytes: 0.04 10*3/uL (ref 0.00–0.07)
Basophils Absolute: 0.1 10*3/uL (ref 0.0–0.1)
Basophils Relative: 1 %
Eosinophils Absolute: 0.2 10*3/uL (ref 0.0–0.5)
Eosinophils Relative: 2 %
HCT: 46.1 % — ABNORMAL HIGH (ref 36.0–46.0)
Hemoglobin: 14.4 g/dL (ref 12.0–15.0)
Immature Granulocytes: 1 %
Lymphocytes Relative: 28 %
Lymphs Abs: 2.1 10*3/uL (ref 0.7–4.0)
MCH: 29.8 pg (ref 26.0–34.0)
MCHC: 31.2 g/dL (ref 30.0–36.0)
MCV: 95.2 fL (ref 80.0–100.0)
Monocytes Absolute: 0.7 10*3/uL (ref 0.1–1.0)
Monocytes Relative: 9 %
Neutro Abs: 4.4 10*3/uL (ref 1.7–7.7)
Neutrophils Relative %: 59 %
Platelets: 266 10*3/uL (ref 150–400)
RBC: 4.84 MIL/uL (ref 3.87–5.11)
RDW: 12.1 % (ref 11.5–15.5)
WBC: 7.4 10*3/uL (ref 4.0–10.5)
nRBC: 0 % (ref 0.0–0.2)

## 2018-10-28 LAB — COMPREHENSIVE METABOLIC PANEL
ALT: 15 U/L (ref 0–44)
AST: 17 U/L (ref 15–41)
Albumin: 3.8 g/dL (ref 3.5–5.0)
Alkaline Phosphatase: 67 U/L (ref 38–126)
Anion gap: 7 (ref 5–15)
BUN: 19 mg/dL (ref 8–23)
CO2: 26 mmol/L (ref 22–32)
Calcium: 8.8 mg/dL — ABNORMAL LOW (ref 8.9–10.3)
Chloride: 109 mmol/L (ref 98–111)
Creatinine, Ser: 1.2 mg/dL — ABNORMAL HIGH (ref 0.44–1.00)
GFR calc Af Amer: 53 mL/min — ABNORMAL LOW (ref >60)
GFR calc non Af Amer: 46 mL/min — ABNORMAL LOW (ref >60)
Glucose, Bld: 108 mg/dL — ABNORMAL HIGH (ref 70–99)
Potassium: 4.1 mmol/L (ref 3.5–5.1)
Sodium: 142 mmol/L (ref 135–145)
Total Bilirubin: 0.4 mg/dL (ref 0.3–1.2)
Total Protein: 6.5 g/dL (ref 6.5–8.1)

## 2018-10-28 NOTE — Telephone Encounter (Signed)
Per 11/22 no los °

## 2018-10-28 NOTE — Progress Notes (Signed)
PCP - Dr. Eliezer Lofts Cardiologist - patient denies  Chest x-ray - n/a EKG - 02/22/2018 Stress Test - patient believes it was 2003, stress test was negative, patient states it was pulled muscle from walking a large dog. ECHO - patient denies Cardiac Cath - patient denies  Sleep Study - patient denies  Blood Thinner Instructions: Aspirin Instructions: last dose 10/27/2018  Anesthesia review: n/a  Patient denies shortness of breath, fever, cough and chest pain at PAT appointment   Patient verbalized understanding of instructions that were given to them at the PAT appointment. Patient was also instructed that they will need to review over the PAT instructions again at home before surgery.

## 2018-10-28 NOTE — Pre-Procedure Instructions (Signed)
[facility]  10/28/2018      [place] [place] [address] [place] [address] Phone: [phone] Fax: [phone]  [place], [place] [address] [place] [address] Phone: [phone] Fax: [id number] [place], [place] [address] [address] Phone: [phone] Fax: [phone]    Your procedure is scheduled on 11/04/2018.  Report to [facility] Admitting at 09:00 A.M.  Call this number if you have problems the morning of surgery:  [phone]   Remember:  Do not eat or drink after midnight.  You may drink clear liquids until 08:00 .  Clear liquids allowed are:                    Water, Juice (non-citric and without pulp), Carbonated beverages, Clear Tea, Black Coffee only and Gatorade    Take these medicines the morning of surgery with A SIP OF WATER: NONE  7 days prior to surgery STOP taking any Aspirin(unless otherwise instructed by your surgeon), Aleve, Naproxen, Ibuprofen, Motrin, Advil, Goody's, BC's, all herbal medications, fish oil, and all vitamins   Follow your surgeon's instructions on when to stop Asprin.  If no instructions were given by your surgeon then you will need to call the office to get those instructions.        Do not wear jewelry, make-up or nail polish.  Do not wear lotions, powders, or perfumes, or deodorant.  Do not shave 48 hours prior to surgery.    Do not bring valuables to the hospital.  [facility] is not responsible for any belongings or valuables.  Contacts, eyeglasses, hearing aids, dentures or bridgework may not be worn into surgery.  Leave your suitcase in the car.  After surgery it may be brought to your room.  For patients admitted to the hospital, discharge time will be determined by your treatment  team.  Patients discharged the day of surgery will not be allowed to drive home.   Name and phone number of your driver:   Special instructions:   [facility]- Preparing For Surgery  Before surgery, you can play an important role. Because skin is not sterile, your skin needs to be as free of germs as possible. You can reduce the number of germs on your skin by washing with CHG (chlorahexidine gluconate) Soap before surgery.  CHG is an antiseptic cleaner which kills germs and bonds with the skin to continue killing germs even after washing.    Oral Hygiene is also important to reduce your risk of infection.  Remember - BRUSH YOUR TEETH THE MORNING OF SURGERY WITH YOUR REGULAR TOOTHPASTE  Please do not use if you have an allergy to CHG or antibacterial soaps. If your skin becomes reddened/irritated stop using the CHG.  Do not shave (including legs and underarms) for at least 48 hours prior to first CHG shower. It is OK to shave your face.  Please follow these instructions carefully.   1. Shower the NIGHT BEFORE SURGERY and the MORNING OF SURGERY with CHG.   2. If you chose to wash your hair, wash your hair first as usual with your normal shampoo.  3. After you shampoo, rinse your hair and body thoroughly to remove the shampoo.  4. Use CHG as  you would any other liquid soap. You can apply CHG directly to the skin and wash gently with a scrungie or a clean washcloth.   5. Apply the CHG Soap to your body ONLY FROM THE NECK DOWN.  Do not use on open wounds or open sores. Avoid contact with your eyes, ears, mouth and genitals (private parts). Wash Face and genitals (private parts)  with your normal soap.  6. Wash thoroughly, paying special attention to the area where your surgery will be performed.  7. Thoroughly rinse your body with warm water from the neck down.  8. DO NOT shower/wash with your normal soap after using and rinsing off the CHG Soap.  9. Pat yourself dry with a CLEAN  TOWEL.  10. Wear CLEAN PAJAMAS to bed the night before surgery, wear comfortable clothes the morning of surgery  11. Place CLEAN SHEETS on your bed the night of your first shower and DO NOT SLEEP WITH PETS.    Day of Surgery: Shower as stated above. Do not apply any deodorants/lotions.  Please wear clean clothes to the hospital/surgery center.   Remember to brush your teeth WITH YOUR REGULAR TOOTHPASTE.    Please read over the following fact sheets that you were given.

## 2018-10-29 ENCOUNTER — Other Ambulatory Visit: Payer: Self-pay | Admitting: *Deleted

## 2018-10-29 ENCOUNTER — Inpatient Hospital Stay (HOSPITAL_BASED_OUTPATIENT_CLINIC_OR_DEPARTMENT_OTHER): Payer: PPO | Admitting: Genetics

## 2018-10-29 ENCOUNTER — Inpatient Hospital Stay: Payer: PPO

## 2018-10-29 DIAGNOSIS — C50812 Malignant neoplasm of overlapping sites of left female breast: Secondary | ICD-10-CM

## 2018-10-29 DIAGNOSIS — C50211 Malignant neoplasm of upper-inner quadrant of right female breast: Secondary | ICD-10-CM | POA: Diagnosis not present

## 2018-10-29 DIAGNOSIS — Z170 Estrogen receptor positive status [ER+]: Secondary | ICD-10-CM

## 2018-10-29 DIAGNOSIS — Z803 Family history of malignant neoplasm of breast: Secondary | ICD-10-CM | POA: Diagnosis not present

## 2018-10-30 ENCOUNTER — Ambulatory Visit
Admission: RE | Admit: 2018-10-30 | Discharge: 2018-10-30 | Disposition: A | Discharge status: Home / Self Care | Payer: PPO | Source: Ambulatory Visit | Attending: General Surgery | Admitting: General Surgery

## 2018-10-30 ENCOUNTER — Encounter: Payer: Self-pay | Admitting: Genetics

## 2018-10-30 DIAGNOSIS — Z803 Family history of malignant neoplasm of breast: Secondary | ICD-10-CM | POA: Insufficient documentation

## 2018-10-30 DIAGNOSIS — C50911 Malignant neoplasm of unspecified site of right female breast: Secondary | ICD-10-CM | POA: Diagnosis not present

## 2018-10-30 DIAGNOSIS — C50211 Malignant neoplasm of upper-inner quadrant of right female breast: Secondary | ICD-10-CM

## 2018-10-30 NOTE — Progress Notes (Signed)
REFERRING PROVIDER: Chauncey Cruel, MD [address] [place], [place] [address]  PRIMARY PROVIDER:  Jinny Sanders, MD  PRIMARY REASON FOR VISIT:  1. Malignant neoplasm of upper-inner quadrant of right breast in female, estrogen receptor positive ([place])   2. Family history of breast cancer   3. 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 Hamor, a 67 y.o. female, was seen for a [place] cancer genetics consultation at the request of Dr. Jana Hakim due to a personal and family history of breast cancer.  Ms. Burruel presents to clinic today to discuss the possibility of a hereditary predisposition to cancer, genetic testing, and to further clarify her future cancer risks, as well as potential cancer risks for family members.   In 2008, at the age of 30, Ms. Paule was diagnosed with DCIS ER+ of the left breast.  She had a left lumpectomy followed by adjuvant radiation and tamoxifen.   Recently, in Nov 2019 at the age of 13, she was diagnosed with a right invasive ductal carcinoma ER/PR+.  She is scheduled to have a right lumpectomy on 11/04/2018 and will follow-up the rest of her care at the [facility].   Ms. Schmader expressed that the results of genetic testing will not impact her breast surgical decision.  She reports she had genetic testing for BRCA1/2 only 11 years ago that is reportedly negative.   HORMONAL RISK FACTORS:  Menarche was at age 59.  First live birth at age 3.  Ovaries intact: no.  Hysterectomy: no.  Menopausal status: postmenopausal.  Colonoscopy: yes; reportedly no polyps, was doing every 5 years, now every 10.  Is due in 2020.Marland Kitchen   Past Medical History:  Diagnosis Date  . Allergy   . Breast cancer ([place]) 2008   left breast  . Family history of breast cancer   . Hyperlipidemia   . Personal history of radiation therapy     Past  Surgical History:  Procedure Laterality Date  . BREAST BIOPSY    . BREAST LUMPECTOMY Left 2008  . FOOT SURGERY     x5 total  . OVARY REMOVED  2009  . TONSILLECTOMY      Social History   Socioeconomic History  . Marital status: Married    Spouse name: Not on file  . Number of children: 2  . Years of education: Not on file  . Highest education level: Not on file  Occupational History  . 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    Employer: [employer]  Social Needs  . Financial resource strain: Not on file  . Food insecurity:    Worry: Not on file    Inability: Not on file  . Transportation needs:    Medical: Not on file    Non-medical: Not on file  Tobacco Use  . Smoking status: Never Smoker  . Smokeless tobacco: Never Used  Substance and Sexual Activity  . Alcohol use: No  . Drug use: No  . Sexual activity: Yes  Lifestyle  . Physical activity:    Days per week: Not on file    Minutes per session: Not on file  . Stress: Not on file  Relationships  . Social connections:    Talks on phone: Not on file    Gets together: Not on file    Attends religious service: Not on file    Active member of club or  organization: Not on file    Attends meetings of clubs or organizations: Not on file    Relationship status: Not on file  Other Topics Concern  . Not on file  Social History Narrative   Regular exercise-yes, walks 45 minutes every day   Diet: fruit and veggies and chicken avoiding salt     FAMILY HISTORY:  We obtained a detailed, 4-generation family history.  Significant diagnoses are listed below: Family History  Problem Relation Age of Onset  . Breast cancer Mother 55  . Diabetes Father   . Breast cancer Sister 26  . Breast cancer Maternal Aunt 72  . Heart attack Maternal Grandmother 75  . Breast cancer Cousin        are dx unk    Ms. Morton has a 49 year-old daughter and a 40 year-old son with no hx of cancer. Ms. Nudelman has a sister who was dx with breast  cancer at 67 and died at 70.  She also has 2 full brothers in their 23's with no hx of cancer.  She has 1 maternal half-brother who is 39 with no hx of cancer.   Ms. Higginbotham father: died at 68 due to complications of diabetes.  Paternal aunts/Uncles: none- father was only child Paternal cousins: N/A Paternal grandfather: died in his late 11s, no hx of cancer Paternal grandmother:no known hx of canccer  Ms. Boeding's mother: died at 16, had a hx of breast cancer dx in her 33's.   Maternal Aunts/Uncles: 3 maternal aunts, 1 had breast cancer dx at 74, died at 31. 1 maternal uncle with no hx of cancer.  Maternal cousins: no hx of cancer Maternal grandfather: died before patient was born, so limited info.  She doesn't think he had cancer.  Maternal grandmother:Died of a heart attack in her 32's.  She had a niece (Patient's 2nd cousin) who had breast cancer dx at an unknown age.   Ms. Chesnut is unaware of previous family history of genetic testing for hereditary cancer risks. Patient's maternal ancestors are of N. European descent, and paternal ancestors are of N. European descent. There is no reported Ashkenazi Jewish ancestry. There is no known consanguinity.  GENETIC COUNSELING ASSESSMENT: MELESA LECY is a 67 y.o. female with a personal and family history which is somewhat suggestive of a Hereditary Cancer Predisposition Syndrome. We, therefore, discussed and recommended the following at today's visit.   DISCUSSION: We reviewed the characteristics, features and inheritance patterns of hereditary cancer syndromes. We also discussed genetic testing, including the appropriate family members to test, the process of testing, insurance coverage and turn-around-time for results. We discussed the implications of a negative, positive and/or variant of uncertain significant result. We recommended Ms. Kushnir pursue genetic testing for the Common Hereditary Cancers gene panel.   The Common  Hereditary Cancer Panel offered by [facility] includes sequencing and/or deletion duplication testing of the following 53 genes: APC, ATM, AXIN2, BARD1, BMPR1A, BRCA1, BRCA2, BRIP1, BUB1B, CDH1, CDK4, CDKN2A, CHEK2, CTNNA1, DICER1, ENG, EPCAM, GALNT12, GREM1, HOXB13, KIT, MEN1, MLH1, MLH3, MSH2, MSH3, MSH6, MUTYH, NBN, NF1, NTHL1, PALB2, PDGFRA, PMS2, POLD1, POLE, PTEN, RAD50, RAD51C, RAD51D, RNF43, RPS20, SDHA, SDHB, SDHC, SDHD, SMAD4, SMARCA4, STK11, TP53, TSC1, TSC2, VHL  We discussed that only 5-10% of cancers are associated with a Hereditary cancer predisposition syndrome.  One of the most common hereditary cancer syndromes that increases breast cancer risk is called Hereditary Breast and Ovarian Cancer (HBOC) syndrome.  This syndrome is caused by mutations  in the BRCA1 and BRCA2 genes.  This syndrome increases an individual's lifetime risk to develop breast, ovarian, pancreatic, and other types of cancer.  She has reportedly tested negative for these genes in the past, however, it is unknown if she had BART testing.  Furthermore, there are additional genes we have identified since that time that re associated with hereditary breast cancer.  Some of these include ATM, PALB2, CHEK2, NBN, etc.   There are also many other cancer predisposition syndromes caused by mutations in several other genes.  We discussed that if she is found to have a mutation in one of these genes, it may impact surgical decisions, and alter future medical management recommendations such as increased cancer screenings and consideration of risk reducing surgeries.  A positive result could also have implications for the patient's family members.  A Negative result would mean we were unable to identify a hereditary component to her cancer, but does not rule out the possibility of a hereditary basis for her cancer.  There could be mutations that are undetectable by current technology, or in genes not yet tested or identified to increase  cancer risk.    We discussed the potential to find a Variant of Uncertain Significance or VUS.  These are variants that have not yet been identified as pathogenic or benign, and it is unknown if this variant is associated with increased cancer risk or if this is a normal finding.  Most VUS's are reclassified to benign or likely benign.   It should not be used to make medical management decisions. With time, we suspect the lab will determine the significance of any VUS's identified if any.   Based on Ms. Clayson's personal and family history of cancer, she meets medical criteria for genetic testing. Despite that she meets criteria, she may still have an out of pocket cost. The laboratory can provide her with an estimate of her OOP cost. she was given the contact information of the laboratory if she has further questions.   PLAN: After considering the risks, benefits, and limitations, Ms. Hailes  provided informed consent to pursue genetic testing and the blood sample was sent to [facility] for analysis of the Common Hereditary Cancers Panel. Results should be available within approximately 2-3 weeks' time, at which point they will be disclosed by telephone to Ms. Meissner, as will any additional recommendations warranted by these results. Ms. Borrayo will receive a summary of her genetic counseling visit and a copy of her results once available. This information will also be available in Epic. We encouraged Ms. Berenguer to remain in contact with cancer genetics annually so that we can continuously update the family history and inform her of any changes in cancer genetics and testing that may be of benefit for her family. Ms. Fesler questions were answered to her satisfaction today. Our contact information was provided should additional questions or concerns arise.  Based on Ms. Guaman's family history, we recommended her siblings/maternal relatives also, have genetic counseling and  testing. Ms. Cotta will let us know if we can be of any assistance in coordinating genetic counseling and/or testing for this family member.   Lastly, we encouraged Ms. Culbertson to remain in contact with cancer genetics annually so that we can continuously update the family history and inform her of any changes in cancer genetics and testing that may be of benefit for this family.   Ms.  Bouton questions were answered to her satisfaction today. Our contact information was  provided should additional questions or concerns arise. Thank you for the referral and allowing [place] to share in the care of your patient.   Tana Felts, MS, [facility] Certified Genetic Counselor [email or web address] phone: [phone]  The patient was seen for a total of 35 minutes in face-to-face genetic counseling. This patient was discussed with Drs. Magrinat, Lindi Adie and/or Burr Medico who agrees with the above.

## 2018-11-02 NOTE — H&P (Signed)
Bridget Cummings Location: [facility] Patient #: [id number] DOB: [birth year] Married / Language: English / Race: White Female       History of Present Illness       This is a 67 year old woman. Prior patient of mine. Referred by Dr. Miquel Dunn at the [facility] for right breast cancer. Bridget Cummings is her PCP. She has seen Dr. Jana Hakim and Dr. Humphrey Rolls in the past     On October 16, 2007 I performed a left breast lumpectomy for a 1.7 cm focus of ductal carcinoma in situ, ER positive. She had external beam radiation therapy and took tamoxifen for 4 years. She has done well She says that she had negative genetic testing 11 years ago after her breast cancer diagnosis. Recent screening mammogram showed category B density. There is a 7 mm mass of the right breast, 1 o'clock position, 5 cm from the nipple. Ultrasound of right axilla is negative. Image guided biopsy of this mass was successfully performed. The original marker clip did not deploy and they went back up the hydromark clip in. The biopsy shows grade 1 invasive ductal carcinoma, ER and PR strongly positive at 100%. HER-2 negative. Ki-67 2%      Morbidities include elective BSO 10 years ago. Left lumpectomy for DCIS in November 2008. Otherwise very healthy. Family history positive for breast cancer in her mother. Sister died of breast cancer. Maternal aunt is deceased had breast cancer. Maternal first cousin has breast cancer. Father died had diabetes Social history. She is married. Her husband is with her throughout the encounter. They live in [place]. She wants to have surgery in [place].      We had a very long discussion. We discussed options for surgical intervention including lumpectomy sentinel node biopsy and radiation therapy, I compare that to unilateral or bilateral mastectomy with or without reconstruction. I told her these were both options. I did not think there was a survival advantage to  mastectomy in her case. I told her I wanted her to see a oncologist and she agrees. I suspect she needs to have her genetics repeated. She does not want to wait and wants to go ahead and schedule surgery. After lengthy discussion she would like to pursue breast conservation think she is a good candidate for that      I'm going to refer and try to get her into see Dr. Jana Hakim preoperative if possible. She is pushing for surgery. Probably will refer to Dr. Baruch Gouty in [place] for radiation therapy.     She'll be scheduled for right breast lumpectomy with radioactive seed localization, right axillary deep sentinel lymph node biopsy. I discussed indications, details, techniques, numerous risk of the surgery with her and her husband. She is aware of the risk of bleeding, infection, cosmetic deformity, reoperation for positive margins or positive nodes. Shoulder disability. Arm swelling, arm numbness, and other unforeseen problems. She understands these issues well. All her questions were answered. She agrees with this plan.   Addendum Note Patient presented in breast conference. They recommended repeat genetic testing since it has been 11 years since this was done Referral has been sent to genetic counseling at [facility].   Past Surgical History Breast Mass; Local Excision  Left. Foot Surgery  Bilateral. Hysterectomy (due to cancer) - Partial   Diagnostic Studies History  Colonoscopy  1-5 years ago Mammogram  1-3 years ago  Allergies  No Known Drug Allergies  Allergies Reconciled   Medication History  Pravastatin Sodium (20MG Tablet, Oral) Active. Medications Reconciled  Pregnancy / Birth History  Age at menarche  59 years. Age of menopause  61-55 Gravida  2 Length (months) of breastfeeding  12-24 Maternal age  37-30 Para  2  Other Problems  Breast Cancer     Review of Systems General Not Present- Appetite Loss, Chills, Fatigue,  Fever, Night Sweats, Weight Gain and Weight Loss. Skin Not Present- Change in Wart/Mole, Dryness, Hives, Jaundice, New Lesions, Non-Healing Wounds, Rash and Ulcer. HEENT Not Present- Earache, Hearing Loss, Hoarseness, Nose Bleed, Oral Ulcers, Ringing in the Ears, Seasonal Allergies, Sinus Pain, Sore Throat, Visual Disturbances, Wears glasses/contact lenses and Yellow Eyes. Respiratory Not Present- Bloody sputum, Chronic Cough, Difficulty Breathing, Snoring and Wheezing. Breast Present- Breast Mass. Not Present- Breast Pain, Nipple Discharge and Skin Changes. Cardiovascular Not Present- Chest Pain, Difficulty Breathing Lying Down, Leg Cramps, Palpitations, Rapid Heart Rate, Shortness of Breath and Swelling of Extremities. Gastrointestinal Not Present- Abdominal Pain, Bloating, Bloody Stool, Change in Bowel Habits, Chronic diarrhea, Constipation, Difficulty Swallowing, Excessive gas, Gets full quickly at meals, Hemorrhoids, Indigestion, Nausea, Rectal Pain and Vomiting. Female Genitourinary Not Present- Frequency, Nocturia, Painful Urination, Pelvic Pain and Urgency. Musculoskeletal Not Present- Back Pain, Joint Pain, Joint Stiffness, Muscle Pain, Muscle Weakness and Swelling of Extremities. Neurological Not Present- Decreased Memory, Fainting, Headaches, Numbness, Seizures, Tingling, Tremor, Trouble walking and Weakness. Psychiatric Not Present- Anxiety, Bipolar, Change in Sleep Pattern, Depression, Fearful and Frequent crying. Endocrine Not Present- Cold Intolerance, Excessive Hunger, Hair Changes, Heat Intolerance, Hot flashes and New Diabetes. Hematology Not Present- Blood Thinners, Easy Bruising, Excessive bleeding, Gland problems, HIV and Persistent Infections.  Vitals  Weight: 175.38 lb Height: 66in Body Surface Area: 1.89 m Body Mass Index: 28.31 kg/m  Temp.: 98.20F  Pulse: 95 (Regular)  BP: 152/82 (Sitting, Left Arm, Standard)       Physical Exam General Mental  Status-Alert. General Appearance-Consistent with stated age. Hydration-Well hydrated. Voice-Normal.  Head and Neck Head-normocephalic, atraumatic with no lesions or palpable masses. Trachea-midline. Thyroid Gland Characteristics - normal size and consistency.  Eye Eyeball - Bilateral-Extraocular movements intact. Sclera/Conjunctiva - Bilateral-No scleral icterus.  Chest and Lung Exam Chest and lung exam reveals -quiet, even and easy respiratory effort with no use of accessory muscles and on auscultation, normal breath sounds, no adventitious sounds and normal vocal resonance. Inspection Chest Wall - Normal. Back - normal.  Breast Note: Transverse curvilinear incision upper left breast. Biopsy site right breast upper inner quadrant. No palpable mass in either breast. No ecchymoses or hematoma persist. Nipple and areolar complexes looked normal. Breasts are relatively thin medially. No axillary mass.   Cardiovascular Cardiovascular examination reveals -normal heart sounds, regular rate and rhythm with no murmurs and normal pedal pulses bilaterally.  Abdomen Inspection Inspection of the abdomen reveals - No Hernias. Skin - Scar - no surgical scars. Palpation/Percussion Palpation and Percussion of the abdomen reveal - Soft, Non Tender, No Rebound tenderness, No Rigidity (guarding) and No hepatosplenomegaly. Auscultation Auscultation of the abdomen reveals - Bowel sounds normal.  Neurologic Neurologic evaluation reveals -alert and oriented x 3 with no impairment of recent or remote memory. Mental Status-Normal.  Musculoskeletal Normal Exam - Left-Upper Extremity Strength Normal and Lower Extremity Strength Normal. Normal Exam - Right-Upper Extremity Strength Normal and Lower Extremity Strength Normal.  Lymphatic Head & Neck  General Head & Neck Lymphatics: Bilateral - Description - Normal. Axillary  General Axillary Region: Bilateral -  Description - Normal. Tenderness - Non Tender. Femoral &  Inguinal  Generalized Femoral & Inguinal Lymphatics: Bilateral - Description - Normal. Tenderness - Non Tender.    Assessment & Plan  PRIMARY CANCER OF UPPER INNER QUADRANT OF RIGHT FEMALE BREAST (C50.211) Impression: clinically T1b,N0.   Your recent imaging studies and biopsy show a 7 mm, low-grade invasive ductal carcinoma of the right breast, upper inner quadrant The tumor is strongly positive for estrogen and progesterone but negative for HER-2 Proliferation index is low, 2%, which means it's theoretically growing slowly Ultrasound of the axilla shows no enlarged lymph nodes  We have discussed surgical options including lumpectomy sentinel node biopsy radiation therapy, and compared trhat to mastectomy with or without reconstruction Since your genetics were negative previously and this is a 7 mm tumor, there is no survival advantage to mastectomy although that is an option You have decided to pursue breast conservation surgery and you are an excellent candidate for that  You have asked that we go ahead and schedule the surgery as soon as possible We usually like to refer our patients to medical oncology preop and so you'll be referred to Dr. Jana Hakim who you have seen before We will also go ahead and initiate scheduling you for right breast lumpectomy with radioactive seed localization and right axillary deep sentinel lymph node biopsy We have discussed the indications, techniques, and risk of the surgery in detail  Since you live in [place], we will refer you to Dr. Donella Stade for external beam radiation therapy postop  Stop taking aspirin 5 days preop  HISTORY OF LEFT BREAST CANCER (Z85.3) HISTORY OF EXTERNAL BEAM RADIATION THERAPY (Z92.3) HISTORY OF BILATERAL SALPINGO-OOPHORECTOMY (BSO) (Z90.79) FAMILY HISTORY OF BREAST CANCER (Z80.3)    Edsel Petrin. Dalbert Batman, M.D., [facility] General and  Minimally invasive Surgery Breast and Colorectal Surgery Office:   [phone] Pager:   [phone]

## 2018-11-04 ENCOUNTER — Ambulatory Visit (HOSPITAL_COMMUNITY): Payer: PPO | Admitting: Registered Nurse

## 2018-11-04 ENCOUNTER — Encounter (HOSPITAL_COMMUNITY)
Admission: RE | Disposition: A | Discharge status: Home / Self Care | Payer: Self-pay | Source: Ambulatory Visit | Attending: General Surgery

## 2018-11-04 ENCOUNTER — Ambulatory Visit
Admission: RE | Admit: 2018-11-04 | Discharge: 2018-11-04 | Disposition: A | Discharge status: Home / Self Care | Payer: PPO | Source: Ambulatory Visit | Attending: General Surgery | Admitting: General Surgery

## 2018-11-04 ENCOUNTER — Encounter (HOSPITAL_COMMUNITY)
Admission: RE | Admit: 2018-11-04 | Discharge: 2018-11-04 | Disposition: A | Discharge status: Home / Self Care | Payer: PPO | Source: Ambulatory Visit | Attending: General Surgery | Admitting: General Surgery

## 2018-11-04 ENCOUNTER — Ambulatory Visit (HOSPITAL_COMMUNITY)
Admission: RE | Admit: 2018-11-04 | Discharge: 2018-11-04 | Disposition: A | Discharge status: Home / Self Care | Payer: PPO | Source: Ambulatory Visit | Attending: General Surgery | Admitting: General Surgery

## 2018-11-04 ENCOUNTER — Other Ambulatory Visit: Payer: Self-pay

## 2018-11-04 ENCOUNTER — Encounter (HOSPITAL_COMMUNITY): Payer: Self-pay | Admitting: Registered Nurse

## 2018-11-04 DIAGNOSIS — Z79899 Other long term (current) drug therapy: Secondary | ICD-10-CM | POA: Diagnosis not present

## 2018-11-04 DIAGNOSIS — G8918 Other acute postprocedural pain: Secondary | ICD-10-CM | POA: Diagnosis not present

## 2018-11-04 DIAGNOSIS — C50211 Malignant neoplasm of upper-inner quadrant of right female breast: Secondary | ICD-10-CM

## 2018-11-04 DIAGNOSIS — C50911 Malignant neoplasm of unspecified site of right female breast: Secondary | ICD-10-CM | POA: Diagnosis not present

## 2018-11-04 DIAGNOSIS — Z923 Personal history of irradiation: Secondary | ICD-10-CM | POA: Insufficient documentation

## 2018-11-04 DIAGNOSIS — Z853 Personal history of malignant neoplasm of breast: Secondary | ICD-10-CM | POA: Insufficient documentation

## 2018-11-04 DIAGNOSIS — Z803 Family history of malignant neoplasm of breast: Secondary | ICD-10-CM | POA: Insufficient documentation

## 2018-11-04 DIAGNOSIS — J309 Allergic rhinitis, unspecified: Secondary | ICD-10-CM | POA: Diagnosis not present

## 2018-11-04 DIAGNOSIS — Z170 Estrogen receptor positive status [ER+]: Secondary | ICD-10-CM | POA: Insufficient documentation

## 2018-11-04 HISTORY — PX: BREAST LUMPECTOMY: SHX2

## 2018-11-04 HISTORY — PX: BREAST LUMPECTOMY WITH RADIOACTIVE SEED AND SENTINEL LYMPH NODE BIOPSY: SHX6550

## 2018-11-04 SURGERY — BREAST LUMPECTOMY WITH RADIOACTIVE SEED AND SENTINEL LYMPH NODE BIOPSY
Anesthesia: General | Site: Breast | Laterality: Right

## 2018-11-04 MED ORDER — FENTANYL CITRATE (PF) 100 MCG/2ML IJ SOLN
INTRAMUSCULAR | Status: AC
  Administered 2018-11-04: 100 ug via INTRAVENOUS
  Filled 2018-11-04: qty 2

## 2018-11-04 MED ORDER — MIDAZOLAM HCL 5 MG/5ML IJ SOLN
INTRAMUSCULAR | Status: DC | PRN
  Administered 2018-11-04: 2 mg via INTRAVENOUS

## 2018-11-04 MED ORDER — METHYLENE BLUE 0.5 % INJ SOLN
INTRAVENOUS | Status: AC
  Filled 2018-11-04: qty 10

## 2018-11-04 MED ORDER — TECHNETIUM TC 99M SULFUR COLLOID FILTERED
1.0000 | Freq: Once | INTRAVENOUS | Status: AC | PRN
  Administered 2018-11-04: 1 via INTRADERMAL

## 2018-11-04 MED ORDER — DEXAMETHASONE SODIUM PHOSPHATE 10 MG/ML IJ SOLN
INTRAMUSCULAR | Status: AC
  Filled 2018-11-04: qty 1

## 2018-11-04 MED ORDER — ROPIVACAINE HCL 5 MG/ML IJ SOLN
INTRAMUSCULAR | Status: DC | PRN
  Administered 2018-11-04 (×6): 5 mL via PERINEURAL

## 2018-11-04 MED ORDER — SODIUM CHLORIDE (PF) 0.9 % IJ SOLN
INTRAVENOUS | Status: DC | PRN
  Administered 2018-11-04: 11:00:00 via INTRAMUSCULAR

## 2018-11-04 MED ORDER — DEXAMETHASONE SODIUM PHOSPHATE 10 MG/ML IJ SOLN
INTRAMUSCULAR | Status: DC | PRN
  Administered 2018-11-04: 4 mg via INTRAVENOUS

## 2018-11-04 MED ORDER — ONDANSETRON HCL 4 MG/2ML IJ SOLN
INTRAMUSCULAR | Status: AC
  Filled 2018-11-04: qty 2

## 2018-11-04 MED ORDER — GABAPENTIN 300 MG PO CAPS
300.0000 mg | ORAL_CAPSULE | ORAL | Status: AC
  Administered 2018-11-04: 300 mg via ORAL
  Filled 2018-11-04: qty 1

## 2018-11-04 MED ORDER — MIDAZOLAM HCL 2 MG/2ML IJ SOLN
2.0000 mg | Freq: Once | INTRAMUSCULAR | Status: AC
  Administered 2018-11-04: 2 mg via INTRAVENOUS

## 2018-11-04 MED ORDER — ACETAMINOPHEN 500 MG PO TABS
1000.0000 mg | ORAL_TABLET | ORAL | Status: AC
  Administered 2018-11-04: 1000 mg via ORAL
  Filled 2018-11-04: qty 2

## 2018-11-04 MED ORDER — SODIUM CHLORIDE 0.9% FLUSH: 3.0000 mL | Freq: Two times a day (BID) | INTRAVENOUS | Status: DC

## 2018-11-04 MED ORDER — PROPOFOL 10 MG/ML IV BOLUS
INTRAVENOUS | Status: DC | PRN
  Administered 2018-11-04: 120 mg via INTRAVENOUS

## 2018-11-04 MED ORDER — HYDROCODONE-ACETAMINOPHEN 5-325 MG PO TABS: 1.0000 | ORAL_TABLET | Freq: Four times a day (QID) | ORAL | 0 refills | Status: DC | PRN

## 2018-11-04 MED ORDER — ACETAMINOPHEN 325 MG PO TABS: 650.0000 mg | ORAL_TABLET | ORAL | Status: DC | PRN

## 2018-11-04 MED ORDER — EPHEDRINE SULFATE 50 MG/ML IJ SOLN
INTRAMUSCULAR | Status: DC | PRN
  Administered 2018-11-04 (×3): 10 mg via INTRAVENOUS

## 2018-11-04 MED ORDER — OXYCODONE HCL 5 MG PO TABS
ORAL_TABLET | ORAL | Status: AC
  Filled 2018-11-04: qty 1

## 2018-11-04 MED ORDER — MIDAZOLAM HCL 2 MG/2ML IJ SOLN
INTRAMUSCULAR | Status: AC
  Administered 2018-11-04: 2 mg via INTRAVENOUS
  Filled 2018-11-04: qty 2

## 2018-11-04 MED ORDER — LIDOCAINE 2% (20 MG/ML) 5 ML SYRINGE
INTRAMUSCULAR | Status: DC | PRN
  Administered 2018-11-04: 100 mg via INTRAVENOUS

## 2018-11-04 MED ORDER — LACTATED RINGERS IV SOLN
INTRAVENOUS | Status: DC | PRN
  Administered 2018-11-04: 11:00:00 via INTRAVENOUS

## 2018-11-04 MED ORDER — BUPIVACAINE-EPINEPHRINE 0.25% -1:200000 IJ SOLN
INTRAMUSCULAR | Status: DC | PRN
  Administered 2018-11-04: 10 mL

## 2018-11-04 MED ORDER — FENTANYL CITRATE (PF) 250 MCG/5ML IJ SOLN
INTRAMUSCULAR | Status: AC
  Filled 2018-11-04: qty 5

## 2018-11-04 MED ORDER — ONDANSETRON HCL 4 MG/2ML IJ SOLN
INTRAMUSCULAR | Status: DC | PRN
  Administered 2018-11-04: 4 mg via INTRAVENOUS

## 2018-11-04 MED ORDER — OXYCODONE HCL 5 MG PO TABS
5.0000 mg | ORAL_TABLET | ORAL | Status: DC | PRN
  Administered 2018-11-04: 5 mg via ORAL

## 2018-11-04 MED ORDER — LACTATED RINGERS IV SOLN: INTRAVENOUS | Status: DC

## 2018-11-04 MED ORDER — SODIUM CHLORIDE 0.9 % IV SOLN: 250.0000 mL | INTRAVENOUS | Status: DC | PRN

## 2018-11-04 MED ORDER — LIDOCAINE 2% (20 MG/ML) 5 ML SYRINGE
INTRAMUSCULAR | Status: AC
  Filled 2018-11-04: qty 5

## 2018-11-04 MED ORDER — FENTANYL CITRATE (PF) 250 MCG/5ML IJ SOLN
INTRAMUSCULAR | Status: DC | PRN
  Administered 2018-11-04: 100 ug via INTRAVENOUS
  Administered 2018-11-04 (×2): 25 ug via INTRAVENOUS

## 2018-11-04 MED ORDER — CELECOXIB 200 MG PO CAPS
200.0000 mg | ORAL_CAPSULE | ORAL | Status: AC
  Administered 2018-11-04: 200 mg via ORAL
  Filled 2018-11-04: qty 1

## 2018-11-04 MED ORDER — PROPOFOL 10 MG/ML IV BOLUS
INTRAVENOUS | Status: AC
  Filled 2018-11-04: qty 20

## 2018-11-04 MED ORDER — ACETAMINOPHEN 500 MG PO TABS: 1000.0000 mg | ORAL_TABLET | Freq: Four times a day (QID) | ORAL | Status: DC

## 2018-11-04 MED ORDER — CEFAZOLIN SODIUM-DEXTROSE 2-4 GM/100ML-% IV SOLN
2.0000 g | INTRAVENOUS | Status: AC
  Administered 2018-11-04: 2 g via INTRAVENOUS
  Filled 2018-11-04: qty 100

## 2018-11-04 MED ORDER — SODIUM CHLORIDE (PF) 0.9 % IJ SOLN
INTRAMUSCULAR | Status: AC
  Filled 2018-11-04: qty 10

## 2018-11-04 MED ORDER — CHLORHEXIDINE GLUCONATE CLOTH 2 % EX PADS: 6.0000 | MEDICATED_PAD | Freq: Once | CUTANEOUS | Status: DC

## 2018-11-04 MED ORDER — BUPIVACAINE-EPINEPHRINE 0.25% -1:200000 IJ SOLN
INTRAMUSCULAR | Status: AC
  Filled 2018-11-04: qty 1

## 2018-11-04 MED ORDER — GLYCOPYRROLATE PF 0.2 MG/ML IJ SOSY
PREFILLED_SYRINGE | INTRAMUSCULAR | Status: DC | PRN
  Administered 2018-11-04: .1 mg via INTRAVENOUS

## 2018-11-04 MED ORDER — FENTANYL CITRATE (PF) 100 MCG/2ML IJ SOLN
INTRAMUSCULAR | Status: AC
  Filled 2018-11-04: qty 2

## 2018-11-04 MED ORDER — FENTANYL CITRATE (PF) 100 MCG/2ML IJ SOLN
100.0000 ug | Freq: Once | INTRAMUSCULAR | Status: AC
  Administered 2018-11-04: 100 ug via INTRAVENOUS

## 2018-11-04 MED ORDER — FENTANYL CITRATE (PF) 100 MCG/2ML IJ SOLN
25.0000 ug | INTRAMUSCULAR | Status: DC | PRN
  Administered 2018-11-04: 25 ug via INTRAVENOUS
  Administered 2018-11-04: 50 ug via INTRAVENOUS

## 2018-11-04 MED ORDER — ACETAMINOPHEN 650 MG RE SUPP: 650.0000 mg | RECTAL | Status: DC | PRN

## 2018-11-04 MED ORDER — MIDAZOLAM HCL 2 MG/2ML IJ SOLN
INTRAMUSCULAR | Status: AC
  Filled 2018-11-04: qty 2

## 2018-11-04 MED ORDER — SODIUM CHLORIDE 0.9% FLUSH: 3.0000 mL | INTRAVENOUS | Status: DC | PRN

## 2018-11-04 MED ORDER — 0.9 % SODIUM CHLORIDE (POUR BTL) OPTIME
TOPICAL | Status: DC | PRN
  Administered 2018-11-04: 1000 mL

## 2018-11-04 SURGICAL SUPPLY — 52 items
ADH SKN CLS APL DERMABOND .7 (GAUZE/BANDAGES/DRESSINGS) ×1
APPLIER CLIP 9.375 MED OPEN (MISCELLANEOUS) ×3
APR CLP MED 9.3 20 MLT OPN (MISCELLANEOUS) ×1
BINDER BREAST LRG (GAUZE/BANDAGES/DRESSINGS) IMPLANT
BINDER BREAST XLRG (GAUZE/BANDAGES/DRESSINGS) ×3 IMPLANT
BLADE SURG 15 STRL LF DISP TIS (BLADE) ×2 IMPLANT
BLADE SURG 15 STRL SS (BLADE) ×4
CANISTER SUCT 3000ML PPV (MISCELLANEOUS) ×3 IMPLANT
CHLORAPREP W/TINT 26ML (MISCELLANEOUS) ×3 IMPLANT
CLIP APPLIE 9.375 MED OPEN (MISCELLANEOUS) ×1 IMPLANT
CONT SPEC 4OZ CLIKSEAL STRL BL (MISCELLANEOUS) ×3 IMPLANT
COVER PROBE W GEL 5X96 (DRAPES) ×3 IMPLANT
COVER SURGICAL LIGHT HANDLE (MISCELLANEOUS) ×3 IMPLANT
COVER WAND RF STERILE (DRAPES) IMPLANT
DERMABOND ADVANCED (GAUZE/BANDAGES/DRESSINGS) ×2
DERMABOND ADVANCED .7 DNX12 (GAUZE/BANDAGES/DRESSINGS) ×1 IMPLANT
DEVICE DUBIN SPECIMEN MAMMOGRA (MISCELLANEOUS) IMPLANT
DRAPE CHEST BREAST 15X10 FENES (DRAPES) ×3 IMPLANT
DRAPE HALF SHEET 40X57 (DRAPES) ×3 IMPLANT
DRAPE UTILITY XL STRL (DRAPES) ×3 IMPLANT
DRSG PAD ABDOMINAL 8X10 ST (GAUZE/BANDAGES/DRESSINGS) ×3 IMPLANT
ELECT CAUTERY BLADE 6.4 (BLADE) ×3 IMPLANT
ELECT REM PT RETURN 9FT ADLT (ELECTROSURGICAL) ×3
ELECTRODE REM PT RTRN 9FT ADLT (ELECTROSURGICAL) ×1 IMPLANT
FILTER STRAW FLUID ASPIR (MISCELLANEOUS) IMPLANT
GAUZE SPONGE 4X4 12PLY STRL (GAUZE/BANDAGES/DRESSINGS) ×3 IMPLANT
GLOVE EUDERMIC 7 POWDERFREE (GLOVE) ×3 IMPLANT
GOWN STRL REUS W/ TWL LRG LVL3 (GOWN DISPOSABLE) ×1 IMPLANT
GOWN STRL REUS W/ TWL XL LVL3 (GOWN DISPOSABLE) ×1 IMPLANT
GOWN STRL REUS W/TWL LRG LVL3 (GOWN DISPOSABLE) ×2
GOWN STRL REUS W/TWL XL LVL3 (GOWN DISPOSABLE) ×2
KIT BASIN OR (CUSTOM PROCEDURE TRAY) ×3 IMPLANT
KIT MARKER MARGIN INK (KITS) ×3 IMPLANT
NDL SAFETY ECLIPSE 18X1.5 (NEEDLE) IMPLANT
NEEDLE HYPO 18GX1.5 SHARP (NEEDLE)
NEEDLE HYPO 25GX1X1/2 BEV (NEEDLE) ×3 IMPLANT
NS IRRIG 1000ML POUR BTL (IV SOLUTION) ×3 IMPLANT
PACK GENERAL/GYN (CUSTOM PROCEDURE TRAY) ×3 IMPLANT
PACK SURGICAL SETUP 50X90 (CUSTOM PROCEDURE TRAY) ×3 IMPLANT
PAD ABD 8X10 STRL (GAUZE/BANDAGES/DRESSINGS) ×3 IMPLANT
PENCIL BUTTON HOLSTER BLD 10FT (ELECTRODE) ×3 IMPLANT
SPONGE LAP 4X18 RFD (DISPOSABLE) ×3 IMPLANT
SUT MNCRL AB 4-0 PS2 18 (SUTURE) ×6 IMPLANT
SUT SILK 2 0 SH (SUTURE) ×3 IMPLANT
SUT VIC AB 3-0 SH 18 (SUTURE) ×3 IMPLANT
SYR BULB 3OZ (MISCELLANEOUS) ×3 IMPLANT
SYR CONTROL 10ML LL (SYRINGE) ×3 IMPLANT
TOWEL OR 17X24 6PK STRL BLUE (TOWEL DISPOSABLE) ×3 IMPLANT
TOWEL OR 17X26 10 PK STRL BLUE (TOWEL DISPOSABLE) ×3 IMPLANT
TUBE CONNECTING 12'X1/4 (SUCTIONS) ×1
TUBE CONNECTING 12X1/4 (SUCTIONS) ×2 IMPLANT
YANKAUER SUCT BULB TIP NO VENT (SUCTIONS) ×3 IMPLANT

## 2018-11-04 NOTE — Op Note (Signed)
Patient Name:           Bridget Cummings   Date of Surgery:        11/04/2018  Pre op Diagnosis:     Cancer right breast, upper inner quadrant, estrogen receptor positive                                      History cancer left breast  Post op Diagnosis:    Same  Procedure:                 Inject blue dye right breast                                      Right breast lumpectomy with radioactive seed localization                                      Excision deep right axillary sentinel lymph nodes  Surgeon:                     Edsel Petrin. Dalbert Batman, M.D., FACS  Assistant:                      OR staff  Operative Indications:   This is a 67 year old woman. Prior patient of mine. Referred by Dr. Miquel Dunn at the [facility] for right breast cancer. Bridget Cummings is her PCP. She has seen Dr. Jana Hakim and Dr. Humphrey Rolls in the past     On October 16, 2007 I performed a left breast lumpectomy for a 1.7 cm focus of ductal carcinoma in situ, ER positive. She had external beam radiation therapy and took tamoxifen for 4 years. She has done well She  had negative genetic testing 11 years ago after her breast cancer diagnosis.  That is being repeated Recent screening mammogram showed category B density. There is a 7 mm mass of the right breast, 1 o'clock position, 5 cm from the nipple. Ultrasound of right axilla is negative. Image guided biopsy of this mass was successfully performed. The original marker clip did not deploy and they went back and put a hydromark clip in. The biopsy shows grade 1 invasive ductal carcinoma, ER and PR strongly positive at 100%. HER-2 negative. Ki-67 2% Family history positive for breast cancer in her mother. Sister died of breast cancer. Maternal aunt is deceased had breast cancer. Maternal first cousin has breast cancer. Father died had diabetes       We discussed options for surgical intervention including lumpectomy sentinel node biopsy and radiation therapy, I compare that  to unilateral or bilateral mastectomy with or without reconstruction. I told her these were both options. I did not think there was a survival advantage to mastectomy in her case.  She has seen Dr. Jana Hakim. After lengthy discussion she would like to pursue breast conservation think she is a good candidate for that     She'll be scheduled for right breast lumpectomy with radioactive seed localization, right axillary deep sentinel lymph node biopsy.  She agrees with this plan.   Operative Findings:       The marker clip and the radioactive seed were in the right breast at the 1 o'clock position.  They were in the posterior third so I went all the way down to the pectoralis muscle.  Therefore the entire posterior margin is the muscle.  The specimen mammogram looked very good with the radioactive seed and the marker clip in the center of the specimen.  I found 3 sentinel lymph nodes.  Procedure in Detail:          Following the induction of general endotracheal anesthesia a surgical timeout was performed and intravenous antibiotics were given.  Nuclear medicine injection had been performed in the holding area.  Pectoral block had been performed.  Following alcohol prep I injected 5 cc of dilute methylene blue into the right breast subareolar area and massaged the breast for 2 minutes.  The entire right chest wall and axilla were then prepped and draped in a sterile fashion.  0.25% Marcaine with epinephrine was used as a local infiltration anesthetic in the skin and subcutaneous tissues.     Using the neoprobe I found the radioactive signal at the 1 o'clock position of the right breast.  A curvilinear incision was planned and marked.  The incision was made with the knife.  The lumpectomy was performed using electrocautery and the neoprobe.  Specimen was removed and marked with silk sutures and a 6 color ink kit.  The specimen mammogram looked very good as described above.  The specimen was sent to the  pathology lab where the seed was retrieved.  The wound was irrigated with saline.  Hemostasis was excellent.  5 metal marker clips were placed in the walls of the lumpectomy cavity.  The breast tissues were closed in deep and superficial layers with interrupted sutures of 3-0 Vicryl and the skin closed with a running subcuticular 4-0 Monocryl and Dermabond.       A transverse incision was made in the right axilla at the hairline.  Dissection was carried down through the clavipectoral fascia.  Using the neoprobe I dissected out and found 3 sentinel lymph nodes.  All of which contain significant radioactivity.  2 of these contained dye but one did not.  After this was done there was very little radioactivity and we completed the dissection.  3 lymph nodes were sent separately to the lab.  Hemostasis was excellent and achieved electrocautery.  The wound was irrigated.  The clavipectoral fascia was closed with 3-0 Vicryl sutures and the skin closed with a running subcuticular 4-0 Monocryl and Dermabond.  Breast binder was placed and the patient taken to PACU in stable condition.  EBL 20 cc.  Counts correct.  Complications none.    Addendum: I logged known to the [employer] and reviewed her prescription medication history     Brannon Levene M. Dalbert Batman, M.D., FACS General and Minimally Invasive Surgery Breast and Colorectal Surgery  11/04/2018 12:20 PM

## 2018-11-04 NOTE — Transfer of Care (Signed)
Immediate Anesthesia Transfer of Care Note  Patient: Bridget Cummings  Procedure(s) Performed: RIGHT BREAST LUMPECTOMY WITH RADIOACTIVE SEED AND RIGHT AXILLARY SENTINEL LYMPH NODE BIOPSY (Right Breast)  Patient Location: PACU  Anesthesia Type:General  Level of Consciousness: awake, alert  and oriented  Airway & Oxygen Therapy: Patient Spontanous Breathing and Patient connected to face mask oxygen  Post-op Assessment: Report given to RN and Post -op Vital signs reviewed and stable  Post vital signs: Reviewed and stable  Last Vitals:  Vitals Value Taken Time  BP 130/78 11/04/2018 12:26 PM  Temp 36.2 C 11/04/2018 12:25 PM  Pulse 78 11/04/2018 12:26 PM  Resp 15 11/04/2018 12:26 PM  SpO2 100 % 11/04/2018 12:26 PM  Vitals shown include unvalidated device data.  Last Pain:  Vitals:   11/04/18 1040  TempSrc:   PainSc: 0-No pain         Complications: No apparent anesthesia complications

## 2018-11-04 NOTE — Addendum Note (Signed)
Addendum  created 11/04/18 1521 by Jearld Pies, CRNA   Charge Capture section accepted

## 2018-11-04 NOTE — Anesthesia Postprocedure Evaluation (Signed)
Anesthesia Post Note  Patient: Bridget Cummings  Procedure(s) Performed: RIGHT BREAST LUMPECTOMY WITH RADIOACTIVE SEED AND RIGHT AXILLARY SENTINEL LYMPH NODE BIOPSY (Right Breast)     Patient location during evaluation: PACU Anesthesia Type: General Level of consciousness: awake Pain management: satisfactory to patient Vital Signs Assessment: post-procedure vital signs reviewed and stable Respiratory status: spontaneous breathing Cardiovascular status: stable Postop Assessment: no apparent nausea or vomiting Anesthetic complications: no    Last Vitals:  Vitals:   11/04/18 1321 11/04/18 1345  BP:  125/62  Pulse: 62 60  Resp: 18 18  Temp:    SpO2: 99% 95%    Last Pain:  Vitals:   11/04/18 1321  TempSrc:   PainSc: 9    Pain Goal:                 Huston Foley

## 2018-11-04 NOTE — Discharge Instructions (Signed)
[facility] °Office Phone Number [phone] ° °BREAST BIOPSY/ PARTIAL MASTECTOMY: POST OP INSTRUCTIONS ° °Always review your discharge instruction sheet given to you by the facility where your surgery was performed. ° °IF YOU HAVE DISABILITY OR FAMILY LEAVE FORMS, YOU MUST BRING THEM TO THE OFFICE FOR PROCESSING.  DO NOT GIVE THEM TO YOUR DOCTOR. ° °1. A prescription for pain medication may be given to you upon discharge.  Take your pain medication as prescribed, if needed.  If narcotic pain medicine is not needed, then you may take acetaminophen (Tylenol) or ibuprofen (Advil) as needed. °2. Take your usually prescribed medications unless otherwise directed °3. If you need a refill on your pain medication, please contact your pharmacy.  They will contact our office to request authorization.  Prescriptions will not be filled after 5pm or on week-ends. °4. You should eat very light the first 24 hours after surgery, such as soup, crackers, pudding, etc.  Resume your normal diet the day after surgery. °5. Most patients will experience some swelling and bruising in the breast.  Ice packs and a good support bra will help.  Swelling and bruising can take several days to resolve.  °6. It is common to experience some constipation if taking pain medication after surgery.  Increasing fluid intake and taking a stool softener will usually help or prevent this problem from occurring.  A mild laxative (Milk of Magnesia or Miralax) should be taken according to package directions if there are no bowel movements after 48 hours. °7. Unless discharge instructions indicate otherwise, you may remove your bandages 24-48 hours after surgery, and you may shower at that time.  You may have steri-strips (small skin tapes) in place directly over the incision.  These strips should be left on the skin for 7-10 days.  If your surgeon used skin glue on the incision, you may shower in 24 hours.  The glue will flake off over the  next 2-3 weeks.  Any sutures or staples will be removed at the office during your follow-up visit. °8. ACTIVITIES:  You may resume regular daily activities (gradually increasing) beginning the next day.  Wearing a good support bra or sports bra minimizes pain and swelling.  You may have sexual intercourse when it is comfortable. °a. You may drive when you no longer are taking prescription pain medication, you can comfortably wear a seatbelt, and you can safely maneuver your car and apply brakes. °b. RETURN TO WORK:  ______________________________________________________________________________________ °9. You should see your doctor in the office for a follow-up appointment approximately two weeks after your surgery.  Your doctor’s nurse will typically make your follow-up appointment when she calls you with your pathology report.  Expect your pathology report 2-3 business days after your surgery.  You may call to check if you do not hear from us after three days. °10. OTHER INSTRUCTIONS: _______________________________________________________________________________________________ _____________________________________________________________________________________________________________________________________ °_____________________________________________________________________________________________________________________________________ °_____________________________________________________________________________________________________________________________________ ° °WHEN TO CALL YOUR DOCTOR: °1. Fever over 101.0 °2. Nausea and/or vomiting. °3. Extreme swelling or bruising. °4. Continued bleeding from incision. °5. Increased pain, redness, or drainage from the incision. ° °The clinic staff is available to answer your questions during regular business hours.  Please don’t hesitate to call and ask to speak to one of the nurses for clinical concerns.  If you have a medical emergency, go to the nearest  emergency room or call 911.  A surgeon from [facility] is always on call at the hospital. ° °For further questions, please visit [email or web address]  °

## 2018-11-04 NOTE — Anesthesia Preprocedure Evaluation (Signed)
Anesthesia Evaluation  Patient identified by MRN, date of birth, ID band Patient awake    Reviewed: Allergy & Precautions, NPO status , Patient's Chart, lab work & pertinent test results  Airway Mallampati: I       Dental no notable dental hx. (+) Teeth Intact   Pulmonary neg pulmonary ROS,    Pulmonary exam normal breath sounds clear to auscultation       Cardiovascular negative cardio ROS Normal cardiovascular exam Rhythm:Regular Rate:Normal     Neuro/Psych negative neurological ROS  negative psych ROS   GI/Hepatic negative GI ROS, Neg liver ROS,   Endo/Other  negative endocrine ROS  Renal/GU negative Renal ROS  negative genitourinary   Musculoskeletal negative musculoskeletal ROS (+)   Abdominal Normal abdominal exam  (+)   Peds  Hematology negative hematology ROS (+)   Anesthesia Other Findings   Reproductive/Obstetrics                             Anesthesia Physical Anesthesia Plan  ASA: II  Anesthesia Plan: General   Post-op Pain Management:    Induction: Intravenous  PONV Risk Score and Plan:   Airway Management Planned: LMA  Additional Equipment:   Intra-op Plan:   Post-operative Plan: Extubation in OR  Informed Consent: I have reviewed the patients History and Physical, chart, labs and discussed the procedure including the risks, benefits and alternatives for the proposed anesthesia with the patient or authorized representative who has indicated his/her understanding and acceptance.   Dental advisory given  Plan Discussed with: CRNA and Surgeon  Anesthesia Plan Comments:         Anesthesia Quick Evaluation

## 2018-11-04 NOTE — Anesthesia Procedure Notes (Signed)
Anesthesia Regional Block: Pectoralis block   Pre-Anesthetic Checklist: ,, timeout performed, Correct Patient, Correct Site, Correct Laterality, Correct Procedure, Correct Position, site marked, Risks and benefits discussed,  Surgical consent,  Pre-op evaluation,  At surgeon's request and post-op pain management  Laterality: Right  Prep: chloraprep       Needles:  Injection technique: Single-shot  Needle Type: Echogenic Stimulator Needle     Needle Length: 10cm  Needle Gauge: 21   Needle insertion depth: 2 cm   Additional Needles:   Procedures:,,,, ultrasound used (permanent image in chart),,,,  Narrative:  Start time: 11/04/2018 10:23 AM End time: 11/04/2018 10:33 AM Injection made incrementally with aspirations every 5 mL.  Performed by: Personally  Anesthesiologist: Lyn Hollingshead, MD

## 2018-11-04 NOTE — Anesthesia Procedure Notes (Signed)
Procedure Name: LMA Insertion Date/Time: 11/04/2018 11:07 AM Performed by: Jearld Pies, CRNA Pre-anesthesia Checklist: Patient identified, Emergency Drugs available, Suction available and Patient being monitored Patient Re-evaluated:Patient Re-evaluated prior to induction Oxygen Delivery Method: Circle System Utilized Preoxygenation: Pre-oxygenation with 100% oxygen Induction Type: IV induction Ventilation: Mask ventilation without difficulty LMA: LMA inserted LMA Size: 4.0 Number of attempts: 1 Airway Equipment and Method: Bite block Placement Confirmation: positive ETCO2 Tube secured with: Tape Dental Injury: Teeth and Oropharynx as per pre-operative assessment

## 2018-11-04 NOTE — Interval H&P Note (Signed)
History and Physical Interval Note:  11/04/2018 10:19 AM  Bridget Cummings  has presented today for surgery, with the diagnosis of RIGHT UPPER INNER BREAST CANCER  The various methods of treatment have been discussed with the patient and family. After consideration of risks, benefits and other options for treatment, the patient has consented to  Procedure(s): RIGHT BREAST LUMPECTOMY WITH RADIOACTIVE SEED AND RIGHT AXILLARY SENTINEL LYMPH NODE BIOPSY (Right) as a surgical intervention .  The patient's history has been reviewed, patient examined, no change in status, stable for surgery.  I have reviewed the patient's chart and labs.  Questions were answered to the patient's satisfaction.     Adin Hector

## 2018-11-05 ENCOUNTER — Encounter (HOSPITAL_COMMUNITY): Payer: Self-pay | Admitting: General Surgery

## 2018-11-07 ENCOUNTER — Telehealth: Payer: Self-pay | Admitting: Genetics

## 2018-11-07 ENCOUNTER — Encounter: Payer: Self-pay | Admitting: Genetics

## 2018-11-07 DIAGNOSIS — Z1379 Encounter for other screening for genetic and chromosomal anomalies: Secondary | ICD-10-CM | POA: Insufficient documentation

## 2018-11-07 NOTE — Telephone Encounter (Signed)
Revealed negative genetic testing.   This normal result is reassuring and indicates that it is unlikely Bridget Cummings's cancer is due to a hereditary cause.  It is unlikely that there is an increased risk of another cancer due to a mutation in one of these genes.  However, genetic testing is not perfect, and cannot definitively rule out a hereditary cause.  It will be important for Bridget to keep in contact with genetics to learn if any additional testing may be needed in the future.     People in this family are still likely at a greater than average risk for breast cancer given the family histoyr- therefore we told Bridget that is important Bridget Cummings and other women in the family tell their doctors about the family history as they may still need extra screening.  We also recommended other maternal relatives consider genetic testing, because there could still be a genetic mutation in the family that Bridget Cummings did not inherit and therefore, was not found in Bridget.

## 2018-11-13 ENCOUNTER — Ambulatory Visit: Payer: Self-pay | Admitting: Genetics

## 2018-11-13 ENCOUNTER — Encounter: Payer: Self-pay | Admitting: Genetics

## 2018-11-13 DIAGNOSIS — C50812 Malignant neoplasm of overlapping sites of left female breast: Secondary | ICD-10-CM

## 2018-11-13 DIAGNOSIS — C50211 Malignant neoplasm of upper-inner quadrant of right female breast: Secondary | ICD-10-CM

## 2018-11-13 DIAGNOSIS — Z803 Family history of malignant neoplasm of breast: Secondary | ICD-10-CM

## 2018-11-13 DIAGNOSIS — Z1379 Encounter for other screening for genetic and chromosomal anomalies: Secondary | ICD-10-CM

## 2018-11-13 DIAGNOSIS — Z170 Estrogen receptor positive status [ER+]: Secondary | ICD-10-CM

## 2018-11-13 NOTE — Progress Notes (Signed)
HPI:  Ms. Rhein was previously seen in the [place] clinic on 10/29/2018 due to a personal and family history of breast cancer and concerns regarding a hereditary predisposition to cancer. Please refer to our prior cancer genetics clinic note for more information regarding Ms. Yeakel's medical, social and family histories, and our assessment and recommendations, at the time. Ms. Flewellen recent genetic test results were disclosed to her, as well as recommendations warranted by these results. These results and recommendations are discussed in more detail below.  CANCER HISTORY:    Malignant neoplasm of overlapping sites of left breast in female, estrogen receptor positive ([place])   10/25/2018 Initial Diagnosis    Malignant neoplasm of overlapping sites of left breast in female, estrogen receptor positive ([place])    11/07/2018 Genetic Testing    The Common Hereditary Cancer Panel offered by [facility] includes sequencing and/or deletion duplication testing of the following 53 genes: APC, ATM, AXIN2, BARD1, BMPR1A, BRCA1, BRCA2, BRIP1, BUB1B, CDH1, CDK4, CDKN2A, CHEK2, CTNNA1, DICER1, ENG, EPCAM, GALNT12, GREM1, HOXB13, KIT, MEN1, MLH1, MLH3, MSH2, MSH3, MSH6, MUTYH, NBN, NF1, NTHL1, PALB2, PDGFRA, PMS2, POLD1, POLE, PTEN, RAD50, RAD51C, RAD51D, RNF43, RPS20, SDHA, SDHB, SDHC, SDHD, SMAD4, SMARCA4, STK11, TP53, TSC1, TSC2, VHL  Results: Negative, no pathogenic variants identified.  The date of her test report is 11/07/2018.       FAMILY HISTORY:  We obtained a detailed, 4-generation family history.  Significant diagnoses are listed below: Family History  Problem Relation Age of Onset  . Breast cancer Mother 56  . Diabetes Father   . Breast cancer Sister 84  . Breast cancer Maternal Aunt 72  . Heart attack Maternal Grandmother 75  . Breast cancer Cousin        are dx unk    Ms. Kress has a 41 year-old daughter and a 50 year-old son with no hx of cancer. Ms. Nachreiner has a  sister who was dx with breast cancer at 22 and died at 58.  She also has 2 full brothers in their 21's with no hx of cancer.  She has 1 maternal half-brother who is 62 with no hx of cancer.   Ms. Truszkowski father: died at 43 due to complications of diabetes.  Paternal aunts/Uncles: none- father was only child Paternal cousins: N/A Paternal grandfather: died in his late 8s, no hx of cancer Paternal grandmother:no known hx of canccer  Ms. Belvin's mother: died at 49, had a hx of breast cancer dx in her 43's.   Maternal Aunts/Uncles: 3 maternal aunts, 1 had breast cancer dx at 40, died at 4. 1 maternal uncle with no hx of cancer.  Maternal cousins: no hx of cancer Maternal grandfather: died before patient was born, so limited info.  She doesn't think he had cancer.  Maternal grandmother:Died of a heart attack in her 65's.  She had a niece (Patient's 2nd cousin) who had breast cancer dx at an unknown age.   Ms. Heaslip is unaware of previous family history of genetic testing for hereditary cancer risks. Patient's maternal ancestors are of N. European descent, and paternal ancestors are of N. European descent. There is no reported Ashkenazi Jewish ancestry. There is no known consanguinity.  GENETIC TEST RESULTS: Genetic testing performed through [facility]'s Common Hereditary Cancers Panel reported out on 11/07/2018 showed no pathogenic mutations. The Common Hereditary Cancer Panel offered by [facility] includes sequencing and/or deletion duplication testing of the following 53 genes: APC, ATM, AXIN2, BARD1,BMPR1A, BRCA1, BRCA2, BRIP1, BUB1B, CDH1,  CDK4, CDKN2A, CHEK2, CTNNA1, DICER1, ENG, EPCAM, GALNT12, GREM1, HOXB13, KIT, MEN1, MLH1, MLH3, MSH2, MSH3, MSH6, MUTYH, NBN, NF1, NTHL1, PALB2, PDGFRA, PMS2, POLD1, POLE, PTEN, RAD50, RAD51C, RAD51D, RNF43, RPS20, SDHA, SDHB, SDHC, SDHD, SMAD4, SMARCA4, STK11, TP53, TSC1, TSC2, VHL  The test report will be scanned into EPIC and will be located under the  Molecular Pathology section of the Results Review tab. A portion of the result report is included below for reference.     We discussed with Ms. Grabinski that because current genetic testing is not perfect, it is possible there may be a gene mutation in one of these genes that current testing cannot detect, but that chance is small.  We also discussed, that there could be another gene that has not yet been discovered, or that we have not yet tested, that is responsible for the cancer diagnoses in the family. It is also possible there is a hereditary cause for the cancer in the family that Ms. Rhoda did not inherit and therefore was not identified in her testing.  Therefore, it is important to remain in touch with cancer genetics in the future so that we can continue to offer Ms. Arman the most up to date genetic testing.   ADDITIONAL GENETIC TESTING: We discussed with Ms. Meester that her genetic testing was fairly extensive.  If there are are genes identified to increase cancer risk that can be analyzed in the future, we would be happy to discuss and coordinate this testing at that time.    CANCER SCREENING RECOMMENDATIONS: This negative result means that we did not identify a hereditary cause for her personal and family history of cancer at this time.  While reassuring, this result does not rule out a hereditary cause for her  cancer.  It is still possible that there could be genetic mutations that are undetectable by current technology, or genetic mutations in genes that have not been tested or identified to increase cancer risk.  Therefore, it is recommended she continue to follow the cancer management and screening guidelines provided by her oncology and primary healthcare provider. An individual's cancer risk is not determined by genetic test results alone.  Overall cancer risk assessment includes additional factors such as personal medical history, family history, etc.  These should be used  to make a personalized plan for cancer prevention and surveillance.    RECOMMENDATIONS FOR FAMILY MEMBERS:  Relatives in this family might be at some increased risk of developing cancer, over the general population risk, simply due to the family history of cancer.  We recommended women in this family have a yearly mammogram beginning at age 61, or 5 years younger than the earliest onset of cancer, an annual clinical breast exam, and perform monthly breast self-exams. Women in this family should also have a gynecological exam as recommended by their primary provider. All family members should have a colonoscopy as directed by their doctors.  All family members should inform their physicians about the family history of cancer so their doctors can make the most appropriate screening recommendations for them.   It is also possible there is a hereditary cause for the cancer in Ms. Houlton's family that she did not inherit and therefore was not identified in her.   Therefore, we recommended her siblings and maternal relatives also have genetic counseling and testing. Ms. Sonntag will let us know if we can be of any assistance in coordinating genetic counseling and/or testing for these family members.   FOLLOW-UP:  Lastly, we discussed with Ms. Bolser that cancer genetics is a rapidly advancing field and it is possible that new genetic tests will be appropriate for her and/or her family members in the future. We encouraged her to remain in contact with cancer genetics on an annual basis so we can update her personal and family histories and let her know of advances in cancer genetics that may benefit this family.   Our contact number was provided. Ms. Massoud questions were answered to her satisfaction, and she knows she is welcome to call us at anytime with additional questions or concerns.   Ferol Luz, MS, [facility] Certified Genetic Counselor [email or web address]

## 2018-11-20 ENCOUNTER — Institutional Professional Consult (permissible substitution): Payer: PPO | Admitting: Radiation Oncology

## 2018-11-21 ENCOUNTER — Encounter (INDEPENDENT_AMBULATORY_CARE_PROVIDER_SITE_OTHER): Payer: Self-pay

## 2018-11-21 ENCOUNTER — Other Ambulatory Visit: Payer: Self-pay

## 2018-11-21 ENCOUNTER — Ambulatory Visit
Admission: RE | Admit: 2018-11-21 | Discharge: 2018-11-21 | Disposition: A | Discharge status: Home / Self Care | Payer: PPO | Source: Ambulatory Visit | Attending: Radiation Oncology | Admitting: Radiation Oncology

## 2018-11-21 ENCOUNTER — Encounter: Payer: Self-pay | Admitting: Radiation Oncology

## 2018-11-21 VITALS — BP 147/72 | HR 83 | Temp 97.8°F | Resp 18 | Wt 173.6 lb

## 2018-11-21 DIAGNOSIS — C50211 Malignant neoplasm of upper-inner quadrant of right female breast: Secondary | ICD-10-CM | POA: Diagnosis not present

## 2018-11-21 DIAGNOSIS — E785 Hyperlipidemia, unspecified: Secondary | ICD-10-CM | POA: Diagnosis not present

## 2018-11-21 DIAGNOSIS — Z170 Estrogen receptor positive status [ER+]: Secondary | ICD-10-CM | POA: Insufficient documentation

## 2018-11-21 DIAGNOSIS — Z803 Family history of malignant neoplasm of breast: Secondary | ICD-10-CM | POA: Insufficient documentation

## 2018-11-21 DIAGNOSIS — Z79899 Other long term (current) drug therapy: Secondary | ICD-10-CM | POA: Insufficient documentation

## 2018-11-21 DIAGNOSIS — Z923 Personal history of irradiation: Secondary | ICD-10-CM | POA: Diagnosis not present

## 2018-11-21 DIAGNOSIS — Z86000 Personal history of in-situ neoplasm of breast: Secondary | ICD-10-CM | POA: Insufficient documentation

## 2018-11-21 DIAGNOSIS — Z7982 Long term (current) use of aspirin: Secondary | ICD-10-CM | POA: Insufficient documentation

## 2018-11-21 NOTE — Consult Note (Signed)
NEW PATIENT EVALUATION  Name: Bridget Cummings  MRN: [id number]  Date:   11/21/2018     DOB: 08/04/1951   This 67 y.o. female patient presents to the clinic for initial evaluation of stage I (T1 BN 0 M0) ER/PR positive HER-2/neu negative invasive mammary carcinoma of the right breast status post wide local excision and sentinel node biopsy.atient has previous history of left breast cancer status post lumpectomy and radiation therapy  REFERRING PHYSICIAN: Fanny Skates, MD  CHIEF COMPLAINT:  Chief Complaint  Patient presents with  . Breast Cancer    Initial Eval    DIAGNOSIS: The encounter diagnosis was Malignant neoplasm of upper-inner quadrant of right breast in female, estrogen receptor positive ([place]).   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 Recently she had a screening mammogram showinga suspicious mass in the right breast at 1:00 position 5 cm from the nipple concerning for malignancy. This was confirmed on ultrasound right axilla was unremarkable by ultrasound. She underwent biopsy which was positive for invasive mammary carcinoma grade 1 ER/PR positive HER-2/neu not overexpressed. She then underwent lumpectomy showing a 0.8 x 0.6 x 0.5 cm overall grade 5 invasive mammary carcinoma. Margins were clear at 1.5 cm for DCIS and 1.5 cm for invasive carcinoma. 3 sentinel lymph nodes were examined all negative for malignancy. Patient has done well postoperatively. She specifically denies breast tenderness cough or bone pain. She is seen today for radiation oncology evaluation. She has been seen by medical oncology and not thought to be candidate for systemic chemotherapy.  PLANNED TREATMENT REGIMEN: hypofractionated right whole breast radiation  PAST MEDICAL  HISTORY:  has a past medical history of Allergy, Breast cancer ([place]) (2008), Family history of breast cancer, Hyperlipidemia, and Personal history of radiation therapy.    PAST SURGICAL HISTORY:  Past Surgical History:  Procedure Laterality Date  . BREAST BIOPSY    . BREAST LUMPECTOMY Left 2008  . BREAST LUMPECTOMY WITH RADIOACTIVE SEED AND SENTINEL LYMPH NODE BIOPSY Right 11/04/2018   Procedure: RIGHT BREAST LUMPECTOMY WITH RADIOACTIVE SEED AND RIGHT AXILLARY SENTINEL LYMPH NODE BIOPSY;  Surgeon: Fanny Skates, MD;  Location: [facility];  Service: General;  Laterality: Right;  . FOOT SURGERY     x5 total  . OVARY REMOVED  2009  . TONSILLECTOMY      FAMILY HISTORY: family history includes Breast cancer in her cousin; Breast cancer (age of onset: 60) in her sister; Breast cancer (age of onset: 47) in her mother; Breast cancer (age of onset: 64) in her maternal aunt; Diabetes in her father; Heart attack (age of onset: 11) in her maternal grandmother.  SOCIAL HISTORY:  reports that she has never smoked. She has never used smokeless tobacco. She reports that she does not drink alcohol or use drugs.  ALLERGIES: Patient has no known allergies.  MEDICATIONS:  Current Outpatient Medications  Medication Sig Dispense Refill  . aspirin 81 MG tablet Take 81 mg by mouth at bedtime.     . Calcium Carb-Cholecalciferol (CALCIUM + VITAMIN D3 PO) Take 2 tablets by mouth daily.    Marland Kitchen HYDROcodone-acetaminophen (NORCO) 5-325 MG tablet Take 1-2 tablets by mouth every 6 (six) hours as needed for moderate pain or severe pain. 20 tablet 0  . polyethylene glycol (MIRALAX / GLYCOLAX) packet Take 17 g by mouth 3 (three)  times a week.     . pravastatin (PRAVACHOL) 20 MG tablet Take 1 tablet (20 mg total) by mouth daily. 90 tablet 3  . vitamin B-12 (CYANOCOBALAMIN) 1000 MCG tablet Take 1,000 mcg by mouth daily.     No current facility-administered medications for this encounter.     ECOG PERFORMANCE STATUS:  0 -  Asymptomatic  REVIEW OF SYSTEMS:  Patient denies any weight loss, fatigue, weakness, fever, chills or night sweats. Patient denies any loss of vision, blurred vision. Patient denies any ringing  of the ears or hearing loss. No irregular heartbeat. Patient denies heart murmur or history of fainting. Patient denies any chest pain or pain radiating to her upper extremities. Patient denies any shortness of breath, difficulty breathing at night, cough or hemoptysis. Patient denies any swelling in the lower legs. Patient denies any nausea vomiting, vomiting of blood, or coffee ground material in the vomitus. Patient denies any stomach pain. Patient states has had normal bowel movements no significant constipation or diarrhea. Patient denies any dysuria, hematuria or significant nocturia. Patient denies any problems walking, swelling in the joints or loss of balance. Patient denies any skin changes, loss of hair or loss of weight. Patient denies any excessive worrying or anxiety or significant depression. Patient denies any problems with insomnia. Patient denies excessive thirst, polyuria, polydipsia. Patient denies any swollen glands, patient denies easy bruising or easy bleeding. Patient denies any recent infections, allergies or URI. Patient "s visual fields have not changed significantly in recent time.    PHYSICAL EXAM: BP (!) 147/72   Pulse 83   Temp 97.8 F (36.6 C)   Resp 18   Wt 173 lb 9.8 oz (78.8 kg)   BMI 28.24 kg/m  Right breast is wide local excision scar which is healing well no dominant mass or nodularity is noted in either breast in 2 positions examined. No axillary or supraclavicular adenopathy is appreciated.Well-developed well-nourished patient in NAD. HEENT reveals PERLA, EOMI, discs not visualized.  Oral cavity is clear. No oral mucosal lesions are identified. Neck is clear without evidence of cervical or supraclavicular adenopathy. Lungs are clear to A&P. Cardiac examination is  essentially unremarkable with regular rate and rhythm without murmur rub or thrill. Abdomen is benign with no organomegaly or masses noted. Motor sensory and DTR levels are equal and symmetric in the upper and lower extremities. Cranial nerves II through XII are grossly intact. Proprioception is intact. No peripheral adenopathy or edema is identified. No motor or sensory levels are noted. Crude visual fields are within normal range.  LABORATORY DATA: pathology reports are reviewed and compatible above-stated findings    RADIOLOGY RESULTS:mammogram and ultrasound reviewed   IMPRESSION: stage I invasive mammary carcinoma of the right breast stage I status post wide local excision and sentinel node biopsy in 67 year old female with prior history of left-sided ductal carcinoma in situ treated with lumpectomy and radiation therapy remotely.  PLAN: at this time I recommended hypofractionated course of whole breast radiation to her right breast. Would treat her over 4 weeks. Also boost her scar another thousand centigray in 5 fractions. Risks and benefits of treatment including skin reaction fatigue alteration of blood counts possible inclusion of superficial lung all were described in detail to the patient. She seems to comprehend my treatment plan well. I first this up and ordered CT simulation right after Christmas holiday.patient will be a candidate for antiestrogen therapy after completion of radiation.  I would like to take this opportunity to thank  you for allowing me to participate in the care of your patient.Noreene Filbert, MD

## 2018-11-28 ENCOUNTER — Ambulatory Visit
Admission: RE | Admit: 2018-11-28 | Discharge: 2018-11-28 | Disposition: A | Discharge status: Home / Self Care | Payer: PPO | Source: Ambulatory Visit | Attending: Radiation Oncology | Admitting: Radiation Oncology

## 2018-11-28 DIAGNOSIS — Z170 Estrogen receptor positive status [ER+]: Secondary | ICD-10-CM | POA: Insufficient documentation

## 2018-11-28 DIAGNOSIS — C50211 Malignant neoplasm of upper-inner quadrant of right female breast: Secondary | ICD-10-CM | POA: Insufficient documentation

## 2018-11-28 DIAGNOSIS — Z510 Encounter for antineoplastic radiation therapy: Secondary | ICD-10-CM | POA: Diagnosis not present

## 2018-11-29 DIAGNOSIS — C50211 Malignant neoplasm of upper-inner quadrant of right female breast: Secondary | ICD-10-CM | POA: Diagnosis not present

## 2018-11-29 DIAGNOSIS — Z170 Estrogen receptor positive status [ER+]: Secondary | ICD-10-CM | POA: Diagnosis not present

## 2018-11-29 DIAGNOSIS — Z510 Encounter for antineoplastic radiation therapy: Secondary | ICD-10-CM | POA: Diagnosis not present

## 2018-12-05 ENCOUNTER — Other Ambulatory Visit: Payer: Self-pay | Admitting: *Deleted

## 2018-12-05 DIAGNOSIS — Z170 Estrogen receptor positive status [ER+]: Secondary | ICD-10-CM

## 2018-12-05 DIAGNOSIS — C50211 Malignant neoplasm of upper-inner quadrant of right female breast: Secondary | ICD-10-CM

## 2018-12-09 ENCOUNTER — Ambulatory Visit
Admission: RE | Admit: 2018-12-09 | Discharge: 2018-12-09 | Disposition: A | Discharge status: Home / Self Care | Payer: PPO | Source: Ambulatory Visit | Attending: Radiation Oncology | Admitting: Radiation Oncology

## 2018-12-09 DIAGNOSIS — Z170 Estrogen receptor positive status [ER+]: Secondary | ICD-10-CM | POA: Diagnosis not present

## 2018-12-09 DIAGNOSIS — Z510 Encounter for antineoplastic radiation therapy: Secondary | ICD-10-CM | POA: Diagnosis not present

## 2018-12-09 DIAGNOSIS — C50211 Malignant neoplasm of upper-inner quadrant of right female breast: Secondary | ICD-10-CM | POA: Insufficient documentation

## 2018-12-10 ENCOUNTER — Ambulatory Visit
Admission: RE | Admit: 2018-12-10 | Discharge: 2018-12-10 | Disposition: A | Discharge status: Home / Self Care | Payer: PPO | Source: Ambulatory Visit | Attending: Radiation Oncology | Admitting: Radiation Oncology

## 2018-12-10 DIAGNOSIS — C50211 Malignant neoplasm of upper-inner quadrant of right female breast: Secondary | ICD-10-CM | POA: Diagnosis not present

## 2018-12-10 DIAGNOSIS — Z170 Estrogen receptor positive status [ER+]: Secondary | ICD-10-CM | POA: Diagnosis not present

## 2018-12-10 DIAGNOSIS — Z510 Encounter for antineoplastic radiation therapy: Secondary | ICD-10-CM | POA: Diagnosis not present

## 2018-12-11 ENCOUNTER — Ambulatory Visit
Admission: RE | Admit: 2018-12-11 | Discharge: 2018-12-11 | Disposition: A | Discharge status: Home / Self Care | Payer: PPO | Source: Ambulatory Visit | Attending: Radiation Oncology | Admitting: Radiation Oncology

## 2018-12-11 DIAGNOSIS — Z510 Encounter for antineoplastic radiation therapy: Secondary | ICD-10-CM | POA: Diagnosis not present

## 2018-12-11 DIAGNOSIS — Z170 Estrogen receptor positive status [ER+]: Secondary | ICD-10-CM | POA: Diagnosis not present

## 2018-12-11 DIAGNOSIS — C50211 Malignant neoplasm of upper-inner quadrant of right female breast: Secondary | ICD-10-CM | POA: Diagnosis not present

## 2018-12-12 ENCOUNTER — Ambulatory Visit
Admission: RE | Admit: 2018-12-12 | Discharge: 2018-12-12 | Disposition: A | Discharge status: Home / Self Care | Payer: PPO | Source: Ambulatory Visit | Attending: Radiation Oncology | Admitting: Radiation Oncology

## 2018-12-12 DIAGNOSIS — Z170 Estrogen receptor positive status [ER+]: Secondary | ICD-10-CM | POA: Diagnosis not present

## 2018-12-12 DIAGNOSIS — C50211 Malignant neoplasm of upper-inner quadrant of right female breast: Secondary | ICD-10-CM | POA: Diagnosis not present

## 2018-12-12 DIAGNOSIS — Z510 Encounter for antineoplastic radiation therapy: Secondary | ICD-10-CM | POA: Diagnosis not present

## 2018-12-13 ENCOUNTER — Ambulatory Visit
Admission: RE | Admit: 2018-12-13 | Discharge: 2018-12-13 | Disposition: A | Discharge status: Home / Self Care | Payer: PPO | Source: Ambulatory Visit | Attending: Radiation Oncology | Admitting: Radiation Oncology

## 2018-12-13 DIAGNOSIS — Z170 Estrogen receptor positive status [ER+]: Secondary | ICD-10-CM | POA: Diagnosis not present

## 2018-12-13 DIAGNOSIS — Z510 Encounter for antineoplastic radiation therapy: Secondary | ICD-10-CM | POA: Diagnosis not present

## 2018-12-13 DIAGNOSIS — C50211 Malignant neoplasm of upper-inner quadrant of right female breast: Secondary | ICD-10-CM | POA: Diagnosis not present

## 2018-12-16 ENCOUNTER — Ambulatory Visit
Admission: RE | Admit: 2018-12-16 | Discharge: 2018-12-16 | Disposition: A | Discharge status: Home / Self Care | Payer: PPO | Source: Ambulatory Visit | Attending: Radiation Oncology | Admitting: Radiation Oncology

## 2018-12-16 DIAGNOSIS — Z510 Encounter for antineoplastic radiation therapy: Secondary | ICD-10-CM | POA: Diagnosis not present

## 2018-12-16 DIAGNOSIS — Z170 Estrogen receptor positive status [ER+]: Secondary | ICD-10-CM | POA: Diagnosis not present

## 2018-12-16 DIAGNOSIS — C50211 Malignant neoplasm of upper-inner quadrant of right female breast: Secondary | ICD-10-CM | POA: Diagnosis not present

## 2018-12-17 ENCOUNTER — Ambulatory Visit
Admission: RE | Admit: 2018-12-17 | Discharge: 2018-12-17 | Disposition: A | Discharge status: Home / Self Care | Payer: PPO | Source: Ambulatory Visit | Attending: Radiation Oncology | Admitting: Radiation Oncology

## 2018-12-17 ENCOUNTER — Other Ambulatory Visit: Payer: Self-pay | Admitting: *Deleted

## 2018-12-17 DIAGNOSIS — Z170 Estrogen receptor positive status [ER+]: Secondary | ICD-10-CM

## 2018-12-17 DIAGNOSIS — C50211 Malignant neoplasm of upper-inner quadrant of right female breast: Secondary | ICD-10-CM | POA: Diagnosis not present

## 2018-12-17 DIAGNOSIS — Z510 Encounter for antineoplastic radiation therapy: Secondary | ICD-10-CM | POA: Diagnosis not present

## 2018-12-18 ENCOUNTER — Ambulatory Visit
Admission: RE | Admit: 2018-12-18 | Discharge: 2018-12-18 | Disposition: A | Discharge status: Home / Self Care | Payer: PPO | Source: Ambulatory Visit | Attending: Radiation Oncology | Admitting: Radiation Oncology

## 2018-12-18 DIAGNOSIS — Z510 Encounter for antineoplastic radiation therapy: Secondary | ICD-10-CM | POA: Diagnosis not present

## 2018-12-18 DIAGNOSIS — C50211 Malignant neoplasm of upper-inner quadrant of right female breast: Secondary | ICD-10-CM | POA: Diagnosis not present

## 2018-12-18 DIAGNOSIS — Z170 Estrogen receptor positive status [ER+]: Secondary | ICD-10-CM | POA: Diagnosis not present

## 2018-12-19 ENCOUNTER — Ambulatory Visit
Admission: RE | Admit: 2018-12-19 | Discharge: 2018-12-19 | Disposition: A | Discharge status: Home / Self Care | Payer: PPO | Source: Ambulatory Visit | Attending: Radiation Oncology | Admitting: Radiation Oncology

## 2018-12-19 ENCOUNTER — Inpatient Hospital Stay: Payer: PPO | Attending: Radiation Oncology

## 2018-12-19 DIAGNOSIS — Z170 Estrogen receptor positive status [ER+]: Secondary | ICD-10-CM | POA: Diagnosis not present

## 2018-12-19 DIAGNOSIS — C50211 Malignant neoplasm of upper-inner quadrant of right female breast: Secondary | ICD-10-CM | POA: Insufficient documentation

## 2018-12-19 DIAGNOSIS — Z510 Encounter for antineoplastic radiation therapy: Secondary | ICD-10-CM | POA: Diagnosis not present

## 2018-12-19 LAB — CBC
HCT: 45.8 % (ref 36.0–46.0)
Hemoglobin: 14.9 g/dL (ref 12.0–15.0)
MCH: 30.2 pg (ref 26.0–34.0)
MCHC: 32.5 g/dL (ref 30.0–36.0)
MCV: 92.9 fL (ref 80.0–100.0)
Platelets: 257 10*3/uL (ref 150–400)
RBC: 4.93 MIL/uL (ref 3.87–5.11)
RDW: 12.4 % (ref 11.5–15.5)
WBC: 5.5 10*3/uL (ref 4.0–10.5)
nRBC: 0 % (ref 0.0–0.2)

## 2018-12-20 ENCOUNTER — Ambulatory Visit
Admission: RE | Admit: 2018-12-20 | Discharge: 2018-12-20 | Disposition: A | Discharge status: Home / Self Care | Payer: PPO | Source: Ambulatory Visit | Attending: Radiation Oncology | Admitting: Radiation Oncology

## 2018-12-20 DIAGNOSIS — Z510 Encounter for antineoplastic radiation therapy: Secondary | ICD-10-CM | POA: Diagnosis not present

## 2018-12-20 DIAGNOSIS — C50211 Malignant neoplasm of upper-inner quadrant of right female breast: Secondary | ICD-10-CM | POA: Diagnosis not present

## 2018-12-20 DIAGNOSIS — Z170 Estrogen receptor positive status [ER+]: Secondary | ICD-10-CM | POA: Diagnosis not present

## 2018-12-23 ENCOUNTER — Ambulatory Visit
Admission: RE | Admit: 2018-12-23 | Discharge: 2018-12-23 | Disposition: A | Discharge status: Home / Self Care | Payer: PPO | Source: Ambulatory Visit | Attending: Radiation Oncology | Admitting: Radiation Oncology

## 2018-12-23 DIAGNOSIS — C50211 Malignant neoplasm of upper-inner quadrant of right female breast: Secondary | ICD-10-CM | POA: Diagnosis not present

## 2018-12-23 DIAGNOSIS — Z510 Encounter for antineoplastic radiation therapy: Secondary | ICD-10-CM | POA: Diagnosis not present

## 2018-12-23 DIAGNOSIS — Z170 Estrogen receptor positive status [ER+]: Secondary | ICD-10-CM | POA: Diagnosis not present

## 2018-12-24 ENCOUNTER — Ambulatory Visit
Admission: RE | Admit: 2018-12-24 | Discharge: 2018-12-24 | Disposition: A | Discharge status: Home / Self Care | Payer: PPO | Source: Ambulatory Visit | Attending: Radiation Oncology | Admitting: Radiation Oncology

## 2018-12-24 DIAGNOSIS — C50211 Malignant neoplasm of upper-inner quadrant of right female breast: Secondary | ICD-10-CM | POA: Diagnosis not present

## 2018-12-24 DIAGNOSIS — Z170 Estrogen receptor positive status [ER+]: Secondary | ICD-10-CM | POA: Diagnosis not present

## 2018-12-24 DIAGNOSIS — Z510 Encounter for antineoplastic radiation therapy: Secondary | ICD-10-CM | POA: Diagnosis not present

## 2018-12-25 ENCOUNTER — Ambulatory Visit
Admission: RE | Admit: 2018-12-25 | Discharge: 2018-12-25 | Disposition: A | Discharge status: Home / Self Care | Payer: PPO | Source: Ambulatory Visit | Attending: Radiation Oncology | Admitting: Radiation Oncology

## 2018-12-25 DIAGNOSIS — Z510 Encounter for antineoplastic radiation therapy: Secondary | ICD-10-CM | POA: Diagnosis not present

## 2018-12-25 DIAGNOSIS — Z170 Estrogen receptor positive status [ER+]: Secondary | ICD-10-CM | POA: Diagnosis not present

## 2018-12-25 DIAGNOSIS — C50211 Malignant neoplasm of upper-inner quadrant of right female breast: Secondary | ICD-10-CM | POA: Diagnosis not present

## 2018-12-26 ENCOUNTER — Ambulatory Visit
Admission: RE | Admit: 2018-12-26 | Discharge: 2018-12-26 | Disposition: A | Discharge status: Home / Self Care | Payer: PPO | Source: Ambulatory Visit | Attending: Radiation Oncology | Admitting: Radiation Oncology

## 2018-12-26 DIAGNOSIS — C50211 Malignant neoplasm of upper-inner quadrant of right female breast: Secondary | ICD-10-CM | POA: Diagnosis not present

## 2018-12-26 DIAGNOSIS — Z510 Encounter for antineoplastic radiation therapy: Secondary | ICD-10-CM | POA: Diagnosis not present

## 2018-12-26 DIAGNOSIS — Z170 Estrogen receptor positive status [ER+]: Secondary | ICD-10-CM | POA: Diagnosis not present

## 2018-12-27 ENCOUNTER — Ambulatory Visit
Admission: RE | Admit: 2018-12-27 | Discharge: 2018-12-27 | Disposition: A | Discharge status: Home / Self Care | Payer: PPO | Source: Ambulatory Visit | Attending: Radiation Oncology | Admitting: Radiation Oncology

## 2018-12-27 DIAGNOSIS — Z510 Encounter for antineoplastic radiation therapy: Secondary | ICD-10-CM | POA: Diagnosis not present

## 2018-12-27 DIAGNOSIS — C50211 Malignant neoplasm of upper-inner quadrant of right female breast: Secondary | ICD-10-CM | POA: Diagnosis not present

## 2018-12-27 DIAGNOSIS — Z170 Estrogen receptor positive status [ER+]: Secondary | ICD-10-CM | POA: Diagnosis not present

## 2018-12-30 ENCOUNTER — Ambulatory Visit
Admission: RE | Admit: 2018-12-30 | Discharge: 2018-12-30 | Disposition: A | Discharge status: Home / Self Care | Payer: PPO | Source: Ambulatory Visit | Attending: Radiation Oncology | Admitting: Radiation Oncology

## 2018-12-30 DIAGNOSIS — C50211 Malignant neoplasm of upper-inner quadrant of right female breast: Secondary | ICD-10-CM | POA: Diagnosis not present

## 2018-12-30 DIAGNOSIS — Z510 Encounter for antineoplastic radiation therapy: Secondary | ICD-10-CM | POA: Diagnosis not present

## 2018-12-30 DIAGNOSIS — Z170 Estrogen receptor positive status [ER+]: Secondary | ICD-10-CM | POA: Diagnosis not present

## 2018-12-31 ENCOUNTER — Ambulatory Visit
Admission: RE | Admit: 2018-12-31 | Discharge: 2018-12-31 | Disposition: A | Discharge status: Home / Self Care | Payer: PPO | Source: Ambulatory Visit | Attending: Radiation Oncology | Admitting: Radiation Oncology

## 2018-12-31 DIAGNOSIS — Z510 Encounter for antineoplastic radiation therapy: Secondary | ICD-10-CM | POA: Diagnosis not present

## 2018-12-31 DIAGNOSIS — C50211 Malignant neoplasm of upper-inner quadrant of right female breast: Secondary | ICD-10-CM | POA: Diagnosis not present

## 2018-12-31 DIAGNOSIS — Z170 Estrogen receptor positive status [ER+]: Secondary | ICD-10-CM | POA: Diagnosis not present

## 2019-01-01 ENCOUNTER — Ambulatory Visit
Admission: RE | Admit: 2019-01-01 | Discharge: 2019-01-01 | Disposition: A | Discharge status: Home / Self Care | Payer: PPO | Source: Ambulatory Visit | Attending: Radiation Oncology | Admitting: Radiation Oncology

## 2019-01-01 DIAGNOSIS — Z510 Encounter for antineoplastic radiation therapy: Secondary | ICD-10-CM | POA: Diagnosis not present

## 2019-01-02 ENCOUNTER — Inpatient Hospital Stay: Payer: PPO

## 2019-01-02 ENCOUNTER — Ambulatory Visit
Admission: RE | Admit: 2019-01-02 | Discharge: 2019-01-02 | Disposition: A | Discharge status: Home / Self Care | Payer: PPO | Source: Ambulatory Visit | Attending: Radiation Oncology | Admitting: Radiation Oncology

## 2019-01-02 DIAGNOSIS — Z510 Encounter for antineoplastic radiation therapy: Secondary | ICD-10-CM | POA: Diagnosis not present

## 2019-01-02 DIAGNOSIS — C50211 Malignant neoplasm of upper-inner quadrant of right female breast: Secondary | ICD-10-CM | POA: Diagnosis not present

## 2019-01-02 DIAGNOSIS — Z170 Estrogen receptor positive status [ER+]: Secondary | ICD-10-CM

## 2019-01-02 LAB — CBC
HCT: 44.9 % (ref 36.0–46.0)
Hemoglobin: 14.4 g/dL (ref 12.0–15.0)
MCH: 30.3 pg (ref 26.0–34.0)
MCHC: 32.1 g/dL (ref 30.0–36.0)
MCV: 94.5 fL (ref 80.0–100.0)
Platelets: 230 10*3/uL (ref 150–400)
RBC: 4.75 MIL/uL (ref 3.87–5.11)
RDW: 12.5 % (ref 11.5–15.5)
WBC: 5.2 10*3/uL (ref 4.0–10.5)
nRBC: 0 % (ref 0.0–0.2)

## 2019-01-03 ENCOUNTER — Ambulatory Visit
Admission: RE | Admit: 2019-01-03 | Discharge: 2019-01-03 | Disposition: A | Discharge status: Home / Self Care | Payer: PPO | Source: Ambulatory Visit | Attending: Radiation Oncology | Admitting: Radiation Oncology

## 2019-01-03 DIAGNOSIS — Z510 Encounter for antineoplastic radiation therapy: Secondary | ICD-10-CM | POA: Diagnosis not present

## 2019-01-06 ENCOUNTER — Ambulatory Visit
Admission: RE | Admit: 2019-01-06 | Discharge: 2019-01-06 | Disposition: A | Discharge status: Home / Self Care | Payer: PPO | Source: Ambulatory Visit | Attending: Radiation Oncology | Admitting: Radiation Oncology

## 2019-01-06 DIAGNOSIS — Z510 Encounter for antineoplastic radiation therapy: Secondary | ICD-10-CM | POA: Insufficient documentation

## 2019-01-06 DIAGNOSIS — Z170 Estrogen receptor positive status [ER+]: Secondary | ICD-10-CM | POA: Insufficient documentation

## 2019-01-06 DIAGNOSIS — C50211 Malignant neoplasm of upper-inner quadrant of right female breast: Secondary | ICD-10-CM | POA: Insufficient documentation

## 2019-01-07 ENCOUNTER — Ambulatory Visit
Admission: RE | Admit: 2019-01-07 | Discharge: 2019-01-07 | Disposition: A | Discharge status: Home / Self Care | Payer: PPO | Source: Ambulatory Visit | Attending: Radiation Oncology | Admitting: Radiation Oncology

## 2019-01-07 DIAGNOSIS — Z510 Encounter for antineoplastic radiation therapy: Secondary | ICD-10-CM | POA: Diagnosis not present

## 2019-01-08 ENCOUNTER — Ambulatory Visit: Payer: PPO

## 2019-01-09 ENCOUNTER — Ambulatory Visit: Payer: PPO

## 2019-01-10 ENCOUNTER — Ambulatory Visit: Payer: PPO

## 2019-01-14 ENCOUNTER — Inpatient Hospital Stay: Payer: PPO | Attending: Oncology | Admitting: Oncology

## 2019-01-14 ENCOUNTER — Encounter: Payer: Self-pay | Admitting: Oncology

## 2019-01-14 ENCOUNTER — Other Ambulatory Visit: Payer: Self-pay

## 2019-01-14 VITALS — BP 163/94 | HR 82 | Temp 99.0°F | Resp 18 | Wt 180.6 lb

## 2019-01-14 DIAGNOSIS — I10 Essential (primary) hypertension: Secondary | ICD-10-CM | POA: Insufficient documentation

## 2019-01-14 DIAGNOSIS — Z7189 Other specified counseling: Secondary | ICD-10-CM | POA: Insufficient documentation

## 2019-01-14 DIAGNOSIS — Z170 Estrogen receptor positive status [ER+]: Secondary | ICD-10-CM | POA: Diagnosis not present

## 2019-01-14 DIAGNOSIS — E6609 Other obesity due to excess calories: Secondary | ICD-10-CM | POA: Diagnosis not present

## 2019-01-14 DIAGNOSIS — Z923 Personal history of irradiation: Secondary | ICD-10-CM | POA: Diagnosis not present

## 2019-01-14 DIAGNOSIS — Z79811 Long term (current) use of aromatase inhibitors: Secondary | ICD-10-CM | POA: Insufficient documentation

## 2019-01-14 DIAGNOSIS — Z803 Family history of malignant neoplasm of breast: Secondary | ICD-10-CM | POA: Insufficient documentation

## 2019-01-14 DIAGNOSIS — C50211 Malignant neoplasm of upper-inner quadrant of right female breast: Secondary | ICD-10-CM | POA: Insufficient documentation

## 2019-01-14 DIAGNOSIS — Z7982 Long term (current) use of aspirin: Secondary | ICD-10-CM | POA: Insufficient documentation

## 2019-01-14 DIAGNOSIS — Z79899 Other long term (current) drug therapy: Secondary | ICD-10-CM | POA: Diagnosis not present

## 2019-01-14 MED ORDER — ANASTROZOLE 1 MG PO TABS: 1.0000 mg | ORAL_TABLET | Freq: Every day | ORAL | 3 refills | Status: DC

## 2019-01-14 NOTE — Progress Notes (Signed)
New patient transferring care from Dr Jana Hakim in [place].

## 2019-01-14 NOTE — Progress Notes (Signed)
Hematology/Oncology Consult note [facility] Telephone:([phone] [phone]  Patient Care Team: Jinny Sanders, MD as PCP - General Magrinat, Virgie Dad, MD as Consulting Physician (Oncology) Fanny Skates, MD as Consulting Physician (General Surgery) Noreene Filbert, MD as Referring Physician (Radiation Oncology) Lloyd Huger, MD as Consulting Physician (Oncology)   Name of the patient: Bridget Cummings  [id number]  09-28-1951    Reason for referral-new diagnosis of breast cancer   Referring physician-Dr. Jana Hakim  Date of visit: 01/14/19   History of presenting illness-patient is a 68 year old female who was diagnosed with left breast cancer in November 2008.  Pathology at that time showed a 1.7 cm DCIS that was ER positive.  She underwent external beam radiation and took tamoxifen for 4 years.  Recently she was found to have an abnormality on routine screening mammogram in November 2019.  Suspicious mass was noted in the right breast 5 cm from the nipple measuring about 7 mm by ultrasound.  Right axilla was unremarkable.  She underwent a lumpectomy and sentinel lymph node biopsy by Dr. Dalbert Batman.  Final pathology showed 8 mm grade 1 invasive mammary carcinoma with negative margins.  3 out of 3 sentinel lymph nodes were negative.  Tumor was strongly ER PR positive 100% and HER-2/neu negative.  Ki-67 was 2%.  Patient was seen by Dr. Donella Stade and has also completed adjuvant radiation treatment to her right breast.  She is here to discuss further management.  She is also undergone genetic testing which was negative.  Patient is retired but is active and swims and bikes daily.  She has had a bilateral nephrectomy in 2009 but still has a uterus.  Overall she is doing well since her radiation and reports no specific complaints at this time  ECOG PS- 0  Pain scale- 0   Review of systems- Review of Systems  Constitutional: Negative for chills, fever,  malaise/fatigue and weight loss.  HENT: Negative for congestion, ear discharge and nosebleeds.   Eyes: Negative for blurred vision.  Respiratory: Negative for cough, hemoptysis, sputum production, shortness of breath and wheezing.   Cardiovascular: Negative for chest pain, palpitations, orthopnea and claudication.  Gastrointestinal: Negative for abdominal pain, blood in stool, constipation, diarrhea, heartburn, melena, nausea and vomiting.  Genitourinary: Negative for dysuria, flank pain, frequency, hematuria and urgency.  Musculoskeletal: Negative for back pain, joint pain and myalgias.  Skin: Negative for rash.  Neurological: Negative for dizziness, tingling, focal weakness, seizures, weakness and headaches.  Endo/Heme/Allergies: Does not bruise/bleed easily.  Psychiatric/Behavioral: Negative for depression and suicidal ideas. The patient does not have insomnia.     No Known Allergies  Patient Active Problem List   Diagnosis Date Noted  . Genetic testing 11/07/2018  . Family history of breast cancer   . Malignant neoplasm of upper-inner quadrant of right breast in female, estrogen receptor positive ([place]) 10/25/2018  . Malignant neoplasm of overlapping sites of left breast in female, estrogen receptor positive ([place]) 10/25/2018  . History of breast cancer 09/21/2011  . Hyperlipidemia LDL goal <130 08/08/2007  . OBESITY 08/08/2007  . ALLERGIC RHINITIS 08/08/2007     Past Medical History:  Diagnosis Date  . Allergy   . Breast cancer ([place]) 2008   left breast  . Family history of breast cancer   . Hyperlipidemia   . Personal history of radiation therapy      Past Surgical History:  Procedure Laterality Date  . BREAST BIOPSY    . BREAST LUMPECTOMY Left  2008  . BREAST LUMPECTOMY WITH RADIOACTIVE SEED AND SENTINEL LYMPH NODE BIOPSY Right 11/04/2018   Procedure: RIGHT BREAST LUMPECTOMY WITH RADIOACTIVE SEED AND RIGHT AXILLARY SENTINEL LYMPH NODE BIOPSY;  Surgeon: Fanny Skates,  MD;  Location: [facility];  Service: General;  Laterality: Right;  . FOOT SURGERY     x5 total  . OVARY REMOVED  2009  . TONSILLECTOMY      Social History   Socioeconomic History  . Marital status: Married    Spouse name: Not on file  . Number of children: 2  . Years of education: Not on file  . Highest education level: Not on file  Occupational History  . 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    Employer: [employer]  Social Needs  . Financial resource strain: Not on file  . Food insecurity:    Worry: Not on file    Inability: Not on file  . Transportation needs:    Medical: Not on file    Non-medical: Not on file  Tobacco Use  . Smoking status: Never Smoker  . Smokeless tobacco: Never Used  Substance and Sexual Activity  . Alcohol use: No  . Drug use: No  . Sexual activity: Yes  Lifestyle  . Physical activity:    Days per week: Not on file    Minutes per session: Not on file  . Stress: Not on file  Relationships  . Social connections:    Talks on phone: Not on file    Gets together: Not on file    Attends religious service: Not on file    Active member of club or organization: Not on file    Attends meetings of clubs or organizations: Not on file    Relationship status: Not on file  . Intimate partner violence:    Fear of current or ex partner: Not on file    Emotionally abused: Not on file    Physically abused: Not on file    Forced sexual activity: Not on file  Other Topics Concern  . Not on file  Social History Narrative   Regular exercise-yes, walks 45 minutes every day   Diet: fruit and veggies and chicken avoiding salt     Family History  Problem Relation Age of Onset  . Breast cancer Mother 25  . Diabetes Father   . Breast cancer Sister 59  . Breast cancer Maternal Aunt 72  . Heart attack Maternal Grandmother 75  . Breast cancer Cousin        are dx unk     Current Outpatient Medications:  .  aspirin 81 MG tablet, Take 81 mg by mouth at bedtime. ,  Disp: , Rfl:  .  Calcium Carb-Cholecalciferol (CALCIUM + VITAMIN D3 PO), Take 2 tablets by mouth daily., Disp: , Rfl:  .  pravastatin (PRAVACHOL) 20 MG tablet, Take 1 tablet (20 mg total) by mouth daily., Disp: 90 tablet, Rfl: 3 .  vitamin B-12 (CYANOCOBALAMIN) 1000 MCG tablet, Take 1,000 mcg by mouth daily., Disp: , Rfl:  .  anastrozole (ARIMIDEX) 1 MG tablet, Take 1 tablet (1 mg total) by mouth daily., Disp: 90 tablet, Rfl: 3 .  polyethylene glycol (MIRALAX / GLYCOLAX) packet, Take 17 g by mouth 3 (three) times a week. , Disp: , Rfl:    Physical exam:  Vitals:   01/14/19 1109  BP: (!) 163/94  Pulse: 82  Resp: 18  Temp: 99 F (37.2 C)  TempSrc: Tympanic  Weight: 180 lb 9 oz (81.9 kg)  Physical Exam HENT:     Head: Normocephalic and atraumatic.  Eyes:     Pupils: Pupils are equal, round, and reactive to light.  Neck:     Musculoskeletal: Normal range of motion.  Cardiovascular:     Rate and Rhythm: Normal rate and regular rhythm.     Heart sounds: Normal heart sounds.  Pulmonary:     Effort: Pulmonary effort is normal.     Breath sounds: Normal breath sounds.  Abdominal:     General: Bowel sounds are normal.     Palpations: Abdomen is soft.  Skin:    General: Skin is warm and dry.  Neurological:     Mental Status: She is alert and oriented to person, place, and time.     Patient is status post right lumpectomy with well-healed surgical scar.  No palpable right axillary adenopathy.  There is some radiation dermatitis noted over the right chest wall which appears to be healing.   CMP Latest Ref Rng & Units 10/28/2018  Glucose 70 - 99 mg/dL 108(H)  BUN 8 - 23 mg/dL 19  Creatinine 0.44 - 1.00 mg/dL 1.20(H)  Sodium 135 - 145 mmol/L 142  Potassium 3.5 - 5.1 mmol/L 4.1  Chloride 98 - 111 mmol/L 109  CO2 22 - 32 mmol/L 26  Calcium 8.9 - 10.3 mg/dL 8.8(L)  Total Protein 6.5 - 8.1 g/dL 6.5  Total Bilirubin 0.3 - 1.2 mg/dL 0.4  Alkaline Phos 38 - 126 U/L 67  AST 15 - 41  U/L 17  ALT 0 - 44 U/L 15   CBC Latest Ref Rng & Units 01/02/2019  WBC 4.0 - 10.5 K/uL 5.2  Hemoglobin 12.0 - 15.0 g/dL 14.4  Hematocrit 36.0 - 46.0 % 44.9  Platelets 150 - 400 K/uL 230     Assessment and plan- Patient is a 68 y.o. female with newly diagnosed invasive mammary carcinoma of the right breast pathological prognostic stage IA pT1 ppN0 cM0, grade 1 ER PR positive HER-2/neu negative status post lumpectomy and adjuvant radiation treatment.  She is here to discuss further treatment options  I discussed the results of the mammogram ultrasound, core biopsy and final pathology with the patient in detail.  Given that her final pathology tumor size was less than 1 cm with grade 1 histology that was strongly ER PR positive and HER-2/neu negative she does not require Oncotype testing at this time.  She would not benefit from adjuvant chemotherapy.  Patient is already completed adjuvant radiation treatment and adjuvant hormone therapy would be indicated for at least 5 years.  Patient did have a baseline bone density scan in 2019 which was normal.  I recommend Arimidex 1 mg p.o. daily for 5 years along with calcium 1200 mg and vitamin D 800 international units.  Discussed risks and benefits of Arimidex including all but not limited to fatigue, hot flashes, arthralgias, hypertension and hypercholesterolemia and worsening bone health.  Treatment will be given with a curative intent.  Patient verbalized understanding and is agreeable to start taking it at this time.  We will send the prescription to her pharmacy.  I will see her back in 6 weeks time with a CMP  Cancer Staging Malignant neoplasm of upper-inner quadrant of right breast in female, estrogen receptor positive ([place]) Staging form: Breast, AJCC 8th Edition - Pathologic stage from 01/14/2019: Stage IA (pT1b, pN0, cM0, G1, ER+, PR+, HER2-) - Signed by Sindy Guadeloupe, MD on 01/14/2019     Thank you for this  kind referral and the opportunity to  participate in the care of this patient   Visit Diagnosis 1. Malignant neoplasm of upper-inner quadrant of right breast in female, estrogen receptor positive ([place])   2. Goals of care, counseling/discussion     Dr. Randa Evens, MD, MPH [facility] at [facility] [phone] 01/14/2019 12:37 PM

## 2019-02-12 ENCOUNTER — Other Ambulatory Visit: Payer: Self-pay

## 2019-02-12 ENCOUNTER — Ambulatory Visit
Admission: RE | Admit: 2019-02-12 | Discharge: 2019-02-12 | Disposition: A | Discharge status: Home / Self Care | Payer: PPO | Source: Ambulatory Visit | Attending: Radiation Oncology | Admitting: Radiation Oncology

## 2019-02-12 ENCOUNTER — Encounter: Payer: Self-pay | Admitting: Radiation Oncology

## 2019-02-12 VITALS — BP 138/84 | HR 75 | Temp 97.2°F | Resp 18 | Wt 185.0 lb

## 2019-02-12 DIAGNOSIS — Z170 Estrogen receptor positive status [ER+]: Secondary | ICD-10-CM | POA: Diagnosis not present

## 2019-02-12 DIAGNOSIS — Z923 Personal history of irradiation: Secondary | ICD-10-CM | POA: Diagnosis not present

## 2019-02-12 DIAGNOSIS — C50211 Malignant neoplasm of upper-inner quadrant of right female breast: Secondary | ICD-10-CM | POA: Insufficient documentation

## 2019-02-12 DIAGNOSIS — Z79811 Long term (current) use of aromatase inhibitors: Secondary | ICD-10-CM | POA: Diagnosis not present

## 2019-02-12 NOTE — Progress Notes (Signed)
Radiation Oncology Follow up Note  Name: Bridget Cummings   Date:   02/12/2019 MRN:  [id number] DOB: 1950-12-31    This 68 y.o. female presents to the clinic today for ne-month follow-up status post whole breast radiation to her right breast for stage I ER/PR positive invasive mammary carcinoma.  REFERRING PROVIDER: Jinny Sanders, MD  HPI: patient is a 68 year old female now out 1 month having completed whole breast radiation to her right breast for stage I ER/PR positive HER-2/neu negative mammary carcinoma status post wide local excision. Seen today in routine follow up she is doing well. She specifically denies breast tenderness cough or bone pain. She has been started on anastrozole as tiring that well without side effect..  COMPLICATIONS OF TREATMENT: none  FOLLOW UP COMPLIANCE: keeps appointments   PHYSICAL EXAM:  BP 138/84 (BP Location: Right Arm, Patient Position: Sitting)   Pulse 75   Temp (!) 97.2 F (36.2 C) (Tympanic)   Resp 18   Wt 184 lb 15.5 oz (83.9 kg)   BMI 30.08 kg/m  Lungs are clear to A&P cardiac examination essentially unremarkable with regular rate and rhythm. No dominant mass or nodularity is noted in either breast in 2 positions examined. Incision is well-healed. No axillary or supraclavicular adenopathy is appreciated. Cosmetic result is excellent.Well-developed well-nourished patient in NAD. HEENT reveals PERLA, EOMI, discs not visualized.  Oral cavity is clear. No oral mucosal lesions are identified. Neck is clear without evidence of cervical or supraclavicular adenopathy. Lungs are clear to A&P. Cardiac examination is essentially unremarkable with regular rate and rhythm without murmur rub or thrill. Abdomen is benign with no organomegaly or masses noted. Motor sensory and DTR levels are equal and symmetric in the upper and lower extremities. Cranial nerves II through XII are grossly intact. Proprioception is intact. No peripheral adenopathy or edema is  identified. No motor or sensory levels are noted. Crude visual fields are within normal range.  RADIOLOGY RESULTS: no current films for review  PLAN: present time patient is doing well 1 month out from whole breast radiation. I'm please were overall progress. I've asked to see her back in 4-5 months for follow-up. Patient is to call sooner with any concerns. She continues on anastrozole without side effect. I would like to take this opportunity to thank you for allowing me to participate in the care of your patient.Noreene Filbert, MD

## 2019-02-13 ENCOUNTER — Other Ambulatory Visit: Payer: Self-pay | Admitting: *Deleted

## 2019-02-13 MED ORDER — PRAVASTATIN SODIUM 20 MG PO TABS: 20.0000 mg | ORAL_TABLET | Freq: Every day | ORAL | 0 refills | Status: DC

## 2019-02-24 ENCOUNTER — Other Ambulatory Visit: Payer: Self-pay

## 2019-02-25 ENCOUNTER — Other Ambulatory Visit: Payer: Self-pay

## 2019-02-25 ENCOUNTER — Other Ambulatory Visit: Payer: PPO

## 2019-02-25 ENCOUNTER — Encounter: Payer: Self-pay | Admitting: Oncology

## 2019-02-25 ENCOUNTER — Other Ambulatory Visit: Payer: Self-pay | Admitting: *Deleted

## 2019-02-25 ENCOUNTER — Inpatient Hospital Stay (HOSPITAL_BASED_OUTPATIENT_CLINIC_OR_DEPARTMENT_OTHER): Payer: PPO | Admitting: Oncology

## 2019-02-25 ENCOUNTER — Ambulatory Visit: Payer: PPO | Admitting: Oncology

## 2019-02-25 ENCOUNTER — Inpatient Hospital Stay: Payer: PPO | Attending: Oncology

## 2019-02-25 VITALS — BP 136/84 | HR 83 | Temp 98.7°F | Resp 18 | Ht 65.75 in | Wt 185.0 lb

## 2019-02-25 DIAGNOSIS — Z79811 Long term (current) use of aromatase inhibitors: Secondary | ICD-10-CM

## 2019-02-25 DIAGNOSIS — Z170 Estrogen receptor positive status [ER+]: Secondary | ICD-10-CM | POA: Insufficient documentation

## 2019-02-25 DIAGNOSIS — C50211 Malignant neoplasm of upper-inner quadrant of right female breast: Secondary | ICD-10-CM

## 2019-02-25 DIAGNOSIS — Z923 Personal history of irradiation: Secondary | ICD-10-CM | POA: Diagnosis not present

## 2019-02-25 DIAGNOSIS — Z5181 Encounter for therapeutic drug level monitoring: Secondary | ICD-10-CM

## 2019-02-25 LAB — COMPREHENSIVE METABOLIC PANEL
ALT: 17 U/L (ref 0–44)
AST: 18 U/L (ref 15–41)
Albumin: 3.8 g/dL (ref 3.5–5.0)
Alkaline Phosphatase: 77 U/L (ref 38–126)
Anion gap: 7 (ref 5–15)
BUN: 20 mg/dL (ref 8–23)
CO2: 26 mmol/L (ref 22–32)
Calcium: 8.9 mg/dL (ref 8.9–10.3)
Chloride: 108 mmol/L (ref 98–111)
Creatinine, Ser: 1.08 mg/dL — ABNORMAL HIGH (ref 0.44–1.00)
GFR calc Af Amer: >60 mL/min (ref >60)
GFR calc non Af Amer: 53 mL/min — ABNORMAL LOW (ref >60)
Glucose, Bld: 106 mg/dL — ABNORMAL HIGH (ref 70–99)
Potassium: 4.3 mmol/L (ref 3.5–5.1)
Sodium: 141 mmol/L (ref 135–145)
Total Bilirubin: 0.7 mg/dL (ref 0.3–1.2)
Total Protein: 6.8 g/dL (ref 6.5–8.1)

## 2019-02-25 NOTE — Progress Notes (Signed)
doin gok on arimidex. Some hot flashes at night but very tolerable

## 2019-02-26 ENCOUNTER — Telehealth: Payer: Self-pay | Admitting: Family Medicine

## 2019-02-26 NOTE — Telephone Encounter (Signed)
Left message asking pt to call office please r/s cpx °

## 2019-02-27 NOTE — Progress Notes (Signed)
Hematology/Oncology Consult note [facility]  Telephone:([phone] [phone]  Patient Care Team: Jinny Sanders, MD as PCP - General Magrinat, Virgie Dad, MD as Consulting Physician (Oncology) Fanny Skates, MD as Consulting Physician (General Surgery) Noreene Filbert, MD as Referring Physician (Radiation Oncology) Lloyd Huger, MD as Consulting Physician (Oncology)   Name of the patient: Bridget Cummings  [id number]  15-May-1951   Date of visit: 02/27/19  Diagnosis- invasive mammary carcinoma of the right breast pathological prognostic stage IA pT1 ppN0 cM0, grade 1 ER PR positive HER-2/neu negative status post lumpectomy and adjuvant radiation treatment.  Chief complaint/ Reason for visit-routine follow-up of breast cancer on Arimidex  Heme/Onc history: patient is a 68 year old female who was diagnosed with left breast cancer in November 2008.  Pathology at that time showed a 1.7 cm DCIS that was ER positive.  She underwent external beam radiation and took tamoxifen for 4 years.  Recently she was found to have an abnormality on routine screening mammogram in November 2019.  Suspicious mass was noted in the right breast 5 cm from the nipple measuring about 7 mm by ultrasound.  Right axilla was unremarkable.  She underwent a lumpectomy and sentinel lymph node biopsy by Dr. Dalbert Batman.  Final pathology showed 8 mm grade 1 invasive mammary carcinoma with negative margins.  3 out of 3 sentinel lymph nodes were negative.  Tumor was strongly ER PR positive 100% and HER-2/neu negative.  Ki-67 was 2%.  Patient was seen by Dr. Donella Stade and has also completed adjuvant radiation treatment to her right breast.  She is here to discuss further management.  She is also undergone genetic testing which was negative.  Patient is retired but is active and swims and bikes daily.  She has had a bilateral nephrectomy in 2009 but still has a uterus.    Lumpectomy on  11/04/2018 showed invasive and in situ ductal carcinoma 0.8 cm.  Negative margins.  3 sentinel lymph nodes were negative for malignancy.  Overall grade 1.  ER 100% positive PR 100% positive HER-2/neu negative and Ki-67 2%.  Patient completed adjuvant radiation treatment and was started on Arimidex in February 2020  Interval history-patient reports occasional hot flashes since she started taking Arimidex.  But overall these have been self-limited.  She denies any fatigue or arthralgias or mood swings  ECOG PS- 0 Pain scale- 0  Review of systems- Review of Systems  Constitutional: Negative for chills, fever, malaise/fatigue and weight loss.  HENT: Negative for congestion, ear discharge and nosebleeds.   Eyes: Negative for blurred vision.  Respiratory: Negative for cough, hemoptysis, sputum production, shortness of breath and wheezing.   Cardiovascular: Negative for chest pain, palpitations, orthopnea and claudication.  Gastrointestinal: Negative for abdominal pain, blood in stool, constipation, diarrhea, heartburn, melena, nausea and vomiting.  Genitourinary: Negative for dysuria, flank pain, frequency, hematuria and urgency.  Musculoskeletal: Negative for back pain, joint pain and myalgias.  Skin: Negative for rash.  Neurological: Negative for dizziness, tingling, focal weakness, seizures, weakness and headaches.  Endo/Heme/Allergies: Does not bruise/bleed easily.       Hot flashes  Psychiatric/Behavioral: Negative for depression and suicidal ideas. The patient does not have insomnia.        No Known Allergies   Past Medical History:  Diagnosis Date  . Allergy   . Breast cancer ([place]) 2008   left breast  . Family history of breast cancer   . Hyperlipidemia   . Personal history of  radiation therapy      Past Surgical History:  Procedure Laterality Date  . BREAST BIOPSY    . BREAST LUMPECTOMY Left 2008  . BREAST LUMPECTOMY WITH RADIOACTIVE SEED AND SENTINEL LYMPH NODE BIOPSY  Right 11/04/2018   Procedure: RIGHT BREAST LUMPECTOMY WITH RADIOACTIVE SEED AND RIGHT AXILLARY SENTINEL LYMPH NODE BIOPSY;  Surgeon: Fanny Skates, MD;  Location: [facility];  Service: General;  Laterality: Right;  . FOOT SURGERY     x5 total  . OVARY REMOVED  2009  . TONSILLECTOMY      Social History   Socioeconomic History  . Marital status: Married    Spouse name: Not on file  . Number of children: 2  . Years of education: Not on file  . Highest education level: Not on file  Occupational History  . 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    Employer: [employer]  Social Needs  . Financial resource strain: Not on file  . Food insecurity:    Worry: Not on file    Inability: Not on file  . Transportation needs:    Medical: Not on file    Non-medical: Not on file  Tobacco Use  . Smoking status: Never Smoker  . Smokeless tobacco: Never Used  Substance and Sexual Activity  . Alcohol use: No  . Drug use: No  . Sexual activity: Yes  Lifestyle  . Physical activity:    Days per week: Not on file    Minutes per session: Not on file  . Stress: Not on file  Relationships  . Social connections:    Talks on phone: Not on file    Gets together: Not on file    Attends religious service: Not on file    Active member of club or organization: Not on file    Attends meetings of clubs or organizations: Not on file    Relationship status: Not on file  . Intimate partner violence:    Fear of current or ex partner: Not on file    Emotionally abused: Not on file    Physically abused: Not on file    Forced sexual activity: Not on file  Other Topics Concern  . Not on file  Social History Narrative   Regular exercise-yes, walks 45 minutes every day   Diet: fruit and veggies and chicken avoiding salt    Family History  Problem Relation Age of Onset  . Breast cancer Mother 52  . Diabetes Father   . Breast cancer Sister 30  . Breast cancer Maternal Aunt 72  . Heart attack Maternal Grandmother  75  . Breast cancer Cousin        are dx unk     Current Outpatient Medications:  .  anastrozole (ARIMIDEX) 1 MG tablet, Take 1 tablet (1 mg total) by mouth daily., Disp: 90 tablet, Rfl: 3 .  aspirin 81 MG tablet, Take 81 mg by mouth at bedtime. , Disp: , Rfl:  .  Calcium Carb-Cholecalciferol (CALCIUM + VITAMIN D3 PO), Take 2 tablets by mouth daily., Disp: , Rfl:  .  polyethylene glycol (MIRALAX / GLYCOLAX) packet, Take 17 g by mouth 3 (three) times a week. , Disp: , Rfl:  .  pravastatin (PRAVACHOL) 20 MG tablet, Take 1 tablet (20 mg total) by mouth daily., Disp: 90 tablet, Rfl: 0 .  vitamin B-12 (CYANOCOBALAMIN) 1000 MCG tablet, Take 1,000 mcg by mouth daily., Disp: , Rfl:   Physical exam:  Vitals:   02/25/19 1011  BP: 136/84  Pulse: 83  Resp: 18  Temp: 98.7 F (37.1 C)  TempSrc: Tympanic  Weight: 185 lb (83.9 kg)  Height: 5' 5.75" (1.67 m)   Physical Exam Constitutional:      General: She is not in acute distress. HENT:     Head: Normocephalic and atraumatic.  Eyes:     Pupils: Pupils are equal, round, and reactive to light.  Neck:     Musculoskeletal: Normal range of motion.  Cardiovascular:     Rate and Rhythm: Normal rate and regular rhythm.     Heart sounds: Normal heart sounds.  Pulmonary:     Effort: Pulmonary effort is normal.     Breath sounds: Normal breath sounds.  Abdominal:     General: Bowel sounds are normal.     Palpations: Abdomen is soft.  Skin:    General: Skin is warm and dry.  Neurological:     Mental Status: She is alert and oriented to person, place, and time.      CMP Latest Ref Rng & Units 02/25/2019  Glucose 70 - 99 mg/dL 106(H)  BUN 8 - 23 mg/dL 20  Creatinine 0.44 - 1.00 mg/dL 1.08(H)  Sodium 135 - 145 mmol/L 141  Potassium 3.5 - 5.1 mmol/L 4.3  Chloride 98 - 111 mmol/L 108  CO2 22 - 32 mmol/L 26  Calcium 8.9 - 10.3 mg/dL 8.9  Total Protein 6.5 - 8.1 g/dL 6.8  Total Bilirubin 0.3 - 1.2 mg/dL 0.7  Alkaline Phos 38 - 126 U/L 77   AST 15 - 41 U/L 18  ALT 0 - 44 U/L 17   CBC Latest Ref Rng & Units 01/02/2019  WBC 4.0 - 10.5 K/uL 5.2  Hemoglobin 12.0 - 15.0 g/dL 14.4  Hematocrit 36.0 - 46.0 % 44.9  Platelets 150 - 400 K/uL 230      Assessment and plan- Patient is a 68 y.o. female with  invasive mammary carcinoma of the right breast pathological prognostic stage IA pT1 ppN0 cM0, grade 1 ER PR positive HER-2/neu negative status post lumpectomy and adjuvant radiation treatment.    She is currently on Arimidex and is here for routine follow-up  Patient started taking Arimidex about 6 weeks ago and she is tolerating it well without any significant side effects other than self-limited hot flashes.  She will continue Arimidex along with calcium and vitamin D for total.  Of 5 years.  She did have a bone density scan in November 2019 which was essentially normal.  I will see her back in 3 months time with CBC with differential and CMP.  Visit Diagnosis 1. Malignant neoplasm of upper-inner quadrant of right breast in female, estrogen receptor positive ([place])   2. Visit for monitoring Arimidex therapy      Dr. Randa Evens, MD, MPH [facility] at [facility] [phone] 02/27/2019 8:34 AM

## 2019-03-04 ENCOUNTER — Encounter: Payer: PPO | Admitting: Family Medicine

## 2019-04-18 DIAGNOSIS — Z90722 Acquired absence of ovaries, bilateral: Secondary | ICD-10-CM | POA: Diagnosis not present

## 2019-04-18 DIAGNOSIS — Z923 Personal history of irradiation: Secondary | ICD-10-CM | POA: Diagnosis not present

## 2019-04-18 DIAGNOSIS — Z9079 Acquired absence of other genital organ(s): Secondary | ICD-10-CM | POA: Diagnosis not present

## 2019-04-18 DIAGNOSIS — Z803 Family history of malignant neoplasm of breast: Secondary | ICD-10-CM | POA: Diagnosis not present

## 2019-04-18 DIAGNOSIS — Z853 Personal history of malignant neoplasm of breast: Secondary | ICD-10-CM | POA: Diagnosis not present

## 2019-04-18 DIAGNOSIS — C50211 Malignant neoplasm of upper-inner quadrant of right female breast: Secondary | ICD-10-CM | POA: Diagnosis not present

## 2019-05-16 ENCOUNTER — Encounter: Payer: Self-pay | Admitting: Family Medicine

## 2019-05-16 ENCOUNTER — Ambulatory Visit (INDEPENDENT_AMBULATORY_CARE_PROVIDER_SITE_OTHER): Payer: PPO | Admitting: Family Medicine

## 2019-05-16 ENCOUNTER — Other Ambulatory Visit: Payer: Self-pay

## 2019-05-16 VITALS — BP 148/92 | HR 96 | Temp 98.6°F | Ht 65.25 in | Wt 190.0 lb

## 2019-05-16 DIAGNOSIS — E785 Hyperlipidemia, unspecified: Secondary | ICD-10-CM | POA: Diagnosis not present

## 2019-05-16 DIAGNOSIS — Z853 Personal history of malignant neoplasm of breast: Secondary | ICD-10-CM | POA: Diagnosis not present

## 2019-05-16 DIAGNOSIS — Z0000 Encounter for general adult medical examination without abnormal findings: Secondary | ICD-10-CM

## 2019-05-16 LAB — LIPID PANEL
Cholesterol: 230 mg/dL — ABNORMAL HIGH (ref 0–200)
HDL: 58.8 mg/dL (ref >39.00)
LDL Cholesterol: 148 mg/dL — ABNORMAL HIGH (ref 0–99)
NonHDL: 170.82
Total CHOL/HDL Ratio: 4
Triglycerides: 113 mg/dL (ref 0.0–149.0)
VLDL: 22.6 mg/dL (ref 0.0–40.0)

## 2019-05-16 NOTE — Assessment & Plan Note (Signed)
Due for re-eval. 

## 2019-05-16 NOTE — Assessment & Plan Note (Signed)
Has completed treatment for right breast cancer now.

## 2019-05-16 NOTE — Patient Instructions (Signed)
Keep up the healthy eating and regular exercise as able!

## 2019-05-16 NOTE — Progress Notes (Signed)
Chief Complaint  Patient presents with  . Medicare Wellness    History of Present Illness: HPI The patient presents for INITIAL annual medicare wellness, complete physical and review of chronic health problems. He/She also has the following acute concerns today: none  I have personally reviewed the Medicare Annual Wellness questionnaire and have noted 1. The patient's medical and social history 2. Their use of alcohol, tobacco or illicit drugs 3. Their current medications and supplements 4. The patient's functional ability including ADL's, fall risks, home safety risks and hearing or visual             impairment. 5. Diet and physical activities 6. Evidence for depression or mood disorders 7.         Updated provider list Cognitive evaluation was performed and recorded on pt medicare questionnaire form. The patients weight, height, BMI and visual acuity have been recorded in the chart  I have made referrals, counseling and provided education to the patient based review of the above and I have provided the pt with a written personalized care plan for preventive services.   Documentation of this information was scanned into the electronic record under the media tab.   Advance directives and end of life planning reviewed in detail with patient and documented in EMR. Patient given handout on advance care directives if needed. HCPOA and living will updated if needed.   Hearing Screening   Method: Audiometry   125Hz 250Hz 500Hz 1000Hz 2000Hz 3000Hz 4000Hz 6000Hz 8000Hz  Right ear:   _0 Left ear:   _1 Comments: Eye Exam with Dr. Marvel Plan 01/2019   Depression screen [facility] 2/9 05/16/2019 02/22/2018  Decreased Interest 0 0  Down, Depressed, Hopeless 0 0  PHQ - 2 Score 0 0   Fall Risk  05/16/2019 02/22/2018  Falls in the past year? 0 No    She has had recurrent breast cancer in right breast: Lumpectomy on 11/04/2018 showed invasive and in situ ductal carcinoma 0.8  cm.  Negative margins.  3 sentinel lymph nodes were negative for malignancy.  Overall grade 1.  ER 100% positive PR 100% positive HER-2/neu negative and Ki-67 2%.  Patient completed adjuvant radiation treatment and was started on Arimidex in February 2020  Hot flashes from arimidex.  BP Readings from Last 3 Encounters:  05/16/19 (!) 148/92  02/25/19 136/84  02/12/19 138/84  White coat HTN.. well controlled at home.   Elevated Cholesterol: due for re-eval on pravastatin Using medications without problems: Muscle aches:  Diet compliance:  Eating healtly. Exercise: walking 3 miles a day Other complaints:   COVID 19 screen No recent travel or known exposure to [place] The patient denies respiratory symptoms of COVID 19 at this time.  The importance of social distancing was discussed today.   Review of Systems  Constitutional: Negative for chills and fever.  HENT: Negative for congestion and ear pain.   Eyes: Negative for pain and redness.  Respiratory: Negative for cough and shortness of breath.   Cardiovascular: Negative for chest pain, palpitations and leg swelling.  Gastrointestinal: Negative for abdominal pain, blood in stool, constipation, diarrhea, nausea and vomiting.  Genitourinary: Negative for dysuria.  Musculoskeletal: Negative for falls and myalgias.  Skin: Negative for rash.  Neurological: Negative for dizziness.  Psychiatric/Behavioral: Negative for depression. The patient is not nervous/anxious.       Past Medical History:  Diagnosis Date  . Allergy   .  Breast cancer ([place]) 2008   left breast  . Family history of breast cancer   . Hyperlipidemia   . Personal history of radiation therapy     reports that she has never smoked. She has never used smokeless tobacco. She reports that she does not drink alcohol or use drugs.   Current Outpatient Medications:  .  anastrozole (ARIMIDEX) 1 MG tablet, Take 1 tablet (1 mg total) by mouth daily., Disp: 90 tablet, Rfl:  3 .  aspirin 81 MG tablet, Take 81 mg by mouth at bedtime. , Disp: , Rfl:  .  Calcium Carb-Cholecalciferol (CALCIUM + VITAMIN D3 PO), Take 2 tablets by mouth daily., Disp: , Rfl:  .  polyethylene glycol (MIRALAX / GLYCOLAX) packet, Take 17 g by mouth 3 (three) times a week. , Disp: , Rfl:  .  pravastatin (PRAVACHOL) 20 MG tablet, Take 1 tablet (20 mg total) by mouth daily., Disp: 90 tablet, Rfl: 0 .  vitamin B-12 (CYANOCOBALAMIN) 1000 MCG tablet, Take 1,000 mcg by mouth daily., Disp: , Rfl:    Observations/Objective: Blood pressure (!) 148/92, pulse 96, temperature 98.6 F (37 C), temperature source Oral, height 5' 5.25" (1.657 m), weight 190 lb (86.2 kg).  Physical Exam Constitutional:      General: She is not in acute distress.    Appearance: Normal appearance. She is well-developed. She is not ill-appearing or toxic-appearing.  HENT:     Head: Normocephalic.     Right Ear: Hearing, tympanic membrane, ear canal and external ear normal.     Left Ear: Hearing, tympanic membrane, ear canal and external ear normal.     Nose: Nose normal.  Eyes:     General: Lids are normal. Lids are everted, no foreign bodies appreciated.     Conjunctiva/sclera: Conjunctivae normal.     Pupils: Pupils are equal, round, and reactive to light.  Neck:     Musculoskeletal: Normal range of motion and neck supple.     Thyroid: No thyroid mass or thyromegaly.     Vascular: No carotid bruit.     Trachea: Trachea normal.  Cardiovascular:     Rate and Rhythm: Normal rate and regular rhythm.     Heart sounds: Normal heart sounds, S1 normal and S2 normal. No murmur. No gallop.   Pulmonary:     Effort: Pulmonary effort is normal. No respiratory distress.     Breath sounds: Normal breath sounds. No wheezing, rhonchi or rales.  Abdominal:     General: Bowel sounds are normal. There is no distension or abdominal bruit.     Palpations: Abdomen is soft. There is no fluid wave or mass.     Tenderness: There is no  abdominal tenderness. There is no guarding or rebound.     Hernia: No hernia is present.  Lymphadenopathy:     Cervical: No cervical adenopathy.  Skin:    General: Skin is warm and dry.     Findings: No rash.  Neurological:     Mental Status: She is alert.     Cranial Nerves: No cranial nerve deficit.     Sensory: No sensory deficit.  Psychiatric:        Mood and Affect: Mood is not anxious or depressed.        Speech: Speech normal.        Behavior: Behavior normal. Behavior is cooperative.        Judgment: Judgment normal.      Assessment and Plan The patient's preventative maintenance  and recommended screening tests for an annual wellness exam were reviewed in full today. Brought up to date unless services declined.  Counselled on the importance of diet, exercise, and its role in overall health and mortality. The patient's FH and SH was reviewed, including their home life, tobacco status, and drug and alcohol status.   Vaccines: uptodate.. Per pt PNA and flu at [facility] screening mammogram...per onc,  hx of breast cancer PAP/DVE: no ovaries due to hx of breast cancer, has uterus.On q5year Pap schedule, last done 2017.. Pap no longer indicated. No DVE indicated, asymptomatic. Colonoscopy 10/2014 Dr. Earlean Shawl, repeat in 5 years. She has had 3 hemorrhoid banding. DEXA;  nml 2019.. plane repeat in 5 years. Refused HIV testing. Hep C: neg      Eliezer Lofts, MD

## 2019-05-19 ENCOUNTER — Other Ambulatory Visit: Payer: Self-pay | Admitting: Family Medicine

## 2019-05-20 ENCOUNTER — Encounter: Payer: Self-pay | Admitting: *Deleted

## 2019-05-27 ENCOUNTER — Encounter: Payer: PPO | Admitting: Family Medicine

## 2019-05-29 ENCOUNTER — Inpatient Hospital Stay: Payer: PPO | Attending: Oncology | Admitting: Oncology

## 2019-05-29 ENCOUNTER — Inpatient Hospital Stay: Payer: PPO

## 2019-05-29 ENCOUNTER — Encounter: Payer: Self-pay | Admitting: Oncology

## 2019-05-29 ENCOUNTER — Other Ambulatory Visit: Payer: Self-pay

## 2019-05-29 VITALS — BP 141/85 | HR 81 | Temp 98.8°F | Resp 20 | Wt 190.3 lb

## 2019-05-29 DIAGNOSIS — Z170 Estrogen receptor positive status [ER+]: Secondary | ICD-10-CM

## 2019-05-29 DIAGNOSIS — C50211 Malignant neoplasm of upper-inner quadrant of right female breast: Secondary | ICD-10-CM | POA: Diagnosis not present

## 2019-05-29 DIAGNOSIS — Z79899 Other long term (current) drug therapy: Secondary | ICD-10-CM | POA: Diagnosis not present

## 2019-05-29 DIAGNOSIS — Z803 Family history of malignant neoplasm of breast: Secondary | ICD-10-CM

## 2019-05-29 DIAGNOSIS — Z905 Acquired absence of kidney: Secondary | ICD-10-CM | POA: Diagnosis not present

## 2019-05-29 DIAGNOSIS — Z79811 Long term (current) use of aromatase inhibitors: Secondary | ICD-10-CM

## 2019-05-29 DIAGNOSIS — Z8249 Family history of ischemic heart disease and other diseases of the circulatory system: Secondary | ICD-10-CM

## 2019-05-29 DIAGNOSIS — Z923 Personal history of irradiation: Secondary | ICD-10-CM | POA: Diagnosis not present

## 2019-05-29 DIAGNOSIS — Z08 Encounter for follow-up examination after completed treatment for malignant neoplasm: Secondary | ICD-10-CM

## 2019-05-29 DIAGNOSIS — R232 Flushing: Secondary | ICD-10-CM

## 2019-05-29 DIAGNOSIS — Z833 Family history of diabetes mellitus: Secondary | ICD-10-CM | POA: Diagnosis not present

## 2019-05-29 DIAGNOSIS — Z853 Personal history of malignant neoplasm of breast: Secondary | ICD-10-CM

## 2019-05-29 DIAGNOSIS — Z5181 Encounter for therapeutic drug level monitoring: Secondary | ICD-10-CM

## 2019-05-29 LAB — COMPREHENSIVE METABOLIC PANEL
ALT: 22 U/L (ref 0–44)
AST: 21 U/L (ref 15–41)
Albumin: 4 g/dL (ref 3.5–5.0)
Alkaline Phosphatase: 83 U/L (ref 38–126)
Anion gap: 8 (ref 5–15)
BUN: 19 mg/dL (ref 8–23)
CO2: 26 mmol/L (ref 22–32)
Calcium: 8.8 mg/dL — ABNORMAL LOW (ref 8.9–10.3)
Chloride: 107 mmol/L (ref 98–111)
Creatinine, Ser: 1.13 mg/dL — ABNORMAL HIGH (ref 0.44–1.00)
GFR calc Af Amer: 59 mL/min — ABNORMAL LOW (ref >60)
GFR calc non Af Amer: 51 mL/min — ABNORMAL LOW (ref >60)
Glucose, Bld: 105 mg/dL — ABNORMAL HIGH (ref 70–99)
Potassium: 4.2 mmol/L (ref 3.5–5.1)
Sodium: 141 mmol/L (ref 135–145)
Total Bilirubin: 0.8 mg/dL (ref 0.3–1.2)
Total Protein: 7 g/dL (ref 6.5–8.1)

## 2019-05-29 LAB — CBC WITH DIFFERENTIAL/PLATELET
Abs Immature Granulocytes: 0.04 10*3/uL (ref 0.00–0.07)
Basophils Absolute: 0.1 10*3/uL (ref 0.0–0.1)
Basophils Relative: 1 %
Eosinophils Absolute: 0.2 10*3/uL (ref 0.0–0.5)
Eosinophils Relative: 3 %
HCT: 44 % (ref 36.0–46.0)
Hemoglobin: 14.7 g/dL (ref 12.0–15.0)
Immature Granulocytes: 1 %
Lymphocytes Relative: 21 %
Lymphs Abs: 1.2 10*3/uL (ref 0.7–4.0)
MCH: 30.9 pg (ref 26.0–34.0)
MCHC: 33.4 g/dL (ref 30.0–36.0)
MCV: 92.6 fL (ref 80.0–100.0)
Monocytes Absolute: 0.5 10*3/uL (ref 0.1–1.0)
Monocytes Relative: 9 %
Neutro Abs: 3.8 10*3/uL (ref 1.7–7.7)
Neutrophils Relative %: 65 %
Platelets: 243 10*3/uL (ref 150–400)
RBC: 4.75 MIL/uL (ref 3.87–5.11)
RDW: 12.5 % (ref 11.5–15.5)
WBC: 5.7 10*3/uL (ref 4.0–10.5)
nRBC: 0 % (ref 0.0–0.2)

## 2019-06-01 NOTE — Progress Notes (Signed)
Hematology/Oncology Consult note [facility]  [phone] [phone]  Patient Care Team: Jinny Sanders, MD as PCP - General Magrinat, Virgie Dad, MD as Consulting Physician (Oncology) Fanny Skates, MD as Consulting Physician (General Surgery) Noreene Filbert, MD as Referring Physician (Radiation Oncology) Lloyd Huger, MD as Consulting Physician (Oncology)   Name of the patient: Bridget Cummings  [id number]  1951-07-03   Date of visit: 06/01/19  Diagnosis-  invasive mammary carcinoma of the right breast pathological prognostic stage IApT1 ppN0 cM0, grade 1 ER PR positive HER-2/neu negative status post lumpectomy and adjuvant radiation treatment.  Chief complaint/ Reason for visit- routine f/u of breast cancer on arimidex  Heme/Onc history: patient is a 68 year old female who was diagnosed with left breast cancer in November 2008. Pathology at that time showed a 1.7 cm DCIS that was ER positive. She underwent external beam radiation and took tamoxifen for 4 years. Recently she was found to have an abnormality on routine screening mammogram in November 2019. Suspicious mass was noted in the right breast 5 cm from the nipple measuring about 7 mm by ultrasound. Right axilla was unremarkable. She underwent a lumpectomy and sentinel lymph node biopsy by Dr. Dalbert Batman. Final pathology showed 8 mm grade 1 invasive mammary carcinoma with negative margins. 3 out of 3 sentinel lymph nodes were negative. Tumor was strongly ER PR positive 100% and HER-2/neu negative. Ki-67 was 2%. Patient was seen by Dr. Donella Stade and has also completed adjuvant radiation treatment to her right breast. She is here to discuss further management. She is also undergone genetic testing which was negative. Patient is retired but is active and swims and bikes daily. She has had a bilateral nephrectomy in 2009 but still has a uterus.   Lumpectomy on 11/04/2018  showed invasive and in situ ductal carcinoma 0.8 cm.  Negative margins.  3 sentinel lymph nodes were negative for malignancy.  Overall grade 1.  ER 100% positive PR 100% positive HER-2/neu negative and Ki-67 2%.  Patient completed adjuvant radiation treatment and was started on Arimidex in February 2020  Interval history- overall she is tolerating arimidex well. She has mild self limited hot flashes. Denies other complaints  ECOG PS- 0 Pain scale- 0   Review of systems- Review of Systems  Constitutional: Negative for chills, fever, malaise/fatigue and weight loss.  HENT: Negative for congestion, ear discharge and nosebleeds.   Eyes: Negative for blurred vision.  Respiratory: Negative for cough, hemoptysis, sputum production, shortness of breath and wheezing.   Cardiovascular: Negative for chest pain, palpitations, orthopnea and claudication.  Gastrointestinal: Negative for abdominal pain, blood in stool, constipation, diarrhea, heartburn, melena, nausea and vomiting.  Genitourinary: Negative for dysuria, flank pain, frequency, hematuria and urgency.  Musculoskeletal: Negative for back pain, joint pain and myalgias.  Skin: Negative for rash.  Neurological: Negative for dizziness, tingling, focal weakness, seizures, weakness and headaches.  Endo/Heme/Allergies: Does not bruise/bleed easily.  Psychiatric/Behavioral: Negative for depression and suicidal ideas. The patient does not have insomnia.       No Known Allergies   Past Medical History:  Diagnosis Date  . Allergy   . Breast cancer ([place]) 2008   left breast  . Family history of breast cancer   . Hyperlipidemia   . Personal history of radiation therapy      Past Surgical History:  Procedure Laterality Date  . BREAST BIOPSY    . BREAST LUMPECTOMY Left 2008  . BREAST LUMPECTOMY WITH RADIOACTIVE SEED  AND SENTINEL LYMPH NODE BIOPSY Right 11/04/2018   Procedure: RIGHT BREAST LUMPECTOMY WITH RADIOACTIVE SEED AND RIGHT AXILLARY  SENTINEL LYMPH NODE BIOPSY;  Surgeon: Fanny Skates, MD;  Location: [facility];  Service: General;  Laterality: Right;  . FOOT SURGERY     x5 total  . OVARY REMOVED  2009  . TONSILLECTOMY      Social History   Socioeconomic History  . Marital status: Married    Spouse name: Not on file  . Number of children: 2  . Years of education: Not on file  . Highest education level: Not on file  Occupational History  . 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    Employer: [employer]  Social Needs  . Financial resource strain: Not on file  . Food insecurity    Worry: Not on file    Inability: Not on file  . Transportation needs    Medical: Not on file    Non-medical: Not on file  Tobacco Use  . Smoking status: Never Smoker  . Smokeless tobacco: Never Used  Substance and Sexual Activity  . Alcohol use: No  . Drug use: No  . Sexual activity: Yes  Lifestyle  . Physical activity    Days per week: Not on file    Minutes per session: Not on file  . Stress: Not on file  Relationships  . Social Herbalist on phone: Not on file    Gets together: Not on file    Attends religious service: Not on file    Active member of club or organization: Not on file    Attends meetings of clubs or organizations: Not on file    Relationship status: Not on file  . Intimate partner violence    Fear of current or ex partner: Not on file    Emotionally abused: Not on file    Physically abused: Not on file    Forced sexual activity: Not on file  Other Topics Concern  . Not on file  Social History Narrative   Regular exercise-yes, walks 45 minutes every day   Diet: fruit and veggies and chicken avoiding salt    Family History  Problem Relation Age of Onset  . Breast cancer Mother 56  . Diabetes Father   . Breast cancer Sister 39  . Breast cancer Maternal Aunt 72  . Heart attack Maternal Grandmother 75  . Breast cancer Cousin        are dx unk     Current Outpatient Medications:  .   anastrozole (ARIMIDEX) 1 MG tablet, Take 1 tablet (1 mg total) by mouth daily., Disp: 90 tablet, Rfl: 3 .  aspirin 81 MG tablet, Take 81 mg by mouth at bedtime. , Disp: , Rfl:  .  Calcium Carb-Cholecalciferol (CALCIUM + VITAMIN D3 PO), Take 2 tablets by mouth daily., Disp: , Rfl:  .  polyethylene glycol (MIRALAX / GLYCOLAX) packet, Take 17 g by mouth 3 (three) times a week. , Disp: , Rfl:  .  pravastatin (PRAVACHOL) 20 MG tablet, TAKE 1 TABLET(20 MG) BY MOUTH DAILY, Disp: 90 tablet, Rfl: 3 .  vitamin B-12 (CYANOCOBALAMIN) 1000 MCG tablet, Take 1,000 mcg by mouth daily., Disp: , Rfl:   Physical exam:  Vitals:   05/29/19 1015 05/29/19 1019  BP: (!) 141/85   Pulse: 81   Resp: 20   Temp: 98.8 F (37.1 C)   TempSrc: Tympanic   Weight:  190 lb 4.8 oz (86.3 kg)   Physical Exam  HENT:     Head: Normocephalic and atraumatic.  Eyes:     Pupils: Pupils are equal, round, and reactive to light.  Neck:     Musculoskeletal: Normal range of motion.  Cardiovascular:     Rate and Rhythm: Normal rate and regular rhythm.     Heart sounds: Normal heart sounds.  Pulmonary:     Effort: Pulmonary effort is normal.     Breath sounds: Normal breath sounds.  Abdominal:     General: Bowel sounds are normal.     Palpations: Abdomen is soft.  Skin:    General: Skin is warm and dry.  Neurological:     Mental Status: She is alert and oriented to person, place, and time.     Breast exam was performed in seated and lying down position. Patient is status post right lumpectomy with a well-healed surgical scar. No evidence of any palpable masses. No evidence of axillary adenopathy. No evidence of any palpable masses or lumps in the left breast. No evidence of leftt axillary adenopathy    CMP Latest Ref Rng & Units 05/29/2019  Glucose 70 - 99 mg/dL 105(H)  BUN 8 - 23 mg/dL 19  Creatinine 0.44 - 1.00 mg/dL 1.13(H)  Sodium 135 - 145 mmol/L 141  Potassium 3.5 - 5.1 mmol/L 4.2  Chloride 98 - 111 mmol/L 107   CO2 22 - 32 mmol/L 26  Calcium 8.9 - 10.3 mg/dL 8.8(L)  Total Protein 6.5 - 8.1 g/dL 7.0  Total Bilirubin 0.3 - 1.2 mg/dL 0.8  Alkaline Phos 38 - 126 U/L 83  AST 15 - 41 U/L 21  ALT 0 - 44 U/L 22   CBC Latest Ref Rng & Units 05/29/2019  WBC 4.0 - 10.5 K/uL 5.7  Hemoglobin 12.0 - 15.0 g/dL 14.7  Hematocrit 36.0 - 46.0 % 44.0  Platelets 150 - 400 K/uL 243     Assessment and plan- Patient is a 68 y.o. female with  invasive mammary carcinoma of the right breast pathological prognostic stage IApT1 pN0 cM0, grade 1 ER PR positive HER-2/neu negative status post lumpectomy and adjuvant radiation treatment. this is a routine f/u of breast cancer on arimidex  Patient is tolerating arimidex well without any significant side effects. Clinically no concenring signs/ symptoms for recurrence. She will be due for mammogram in Dec 2020. Baseline bone density scan was normal. She sees Dr. Dalbert Batman in 3 months.  I will see her back in 6 months. No labs    Visit Diagnosis 1. Encounter for follow-up surveillance of breast cancer   2. Visit for monitoring Arimidex therapy      Dr. Randa Evens, MD, MPH [facility] [phone] 06/01/2019 11:11 AM

## 2019-07-16 ENCOUNTER — Ambulatory Visit: Payer: PPO | Admitting: Radiation Oncology

## 2019-08-22 ENCOUNTER — Other Ambulatory Visit: Payer: Self-pay

## 2019-08-25 ENCOUNTER — Ambulatory Visit
Admission: RE | Admit: 2019-08-25 | Discharge: 2019-08-25 | Disposition: A | Discharge status: Home / Self Care | Payer: PPO | Source: Ambulatory Visit | Attending: Radiation Oncology | Admitting: Radiation Oncology

## 2019-08-25 ENCOUNTER — Other Ambulatory Visit: Payer: Self-pay

## 2019-08-25 ENCOUNTER — Encounter: Payer: Self-pay | Admitting: Radiation Oncology

## 2019-08-25 VITALS — BP 168/93 | HR 88 | Temp 99.5°F | Resp 16 | Wt 197.9 lb

## 2019-08-25 DIAGNOSIS — Z79811 Long term (current) use of aromatase inhibitors: Secondary | ICD-10-CM | POA: Insufficient documentation

## 2019-08-25 DIAGNOSIS — Z923 Personal history of irradiation: Secondary | ICD-10-CM | POA: Diagnosis not present

## 2019-08-25 DIAGNOSIS — Z170 Estrogen receptor positive status [ER+]: Secondary | ICD-10-CM | POA: Diagnosis not present

## 2019-08-25 DIAGNOSIS — C50211 Malignant neoplasm of upper-inner quadrant of right female breast: Secondary | ICD-10-CM

## 2019-08-25 NOTE — Progress Notes (Signed)
Radiation Oncology Follow up Note  Name: Bridget Cummings   Date:   08/25/2019 MRN:  [id number] DOB: 07-May-1951    This 68 y.o. female presents to the clinic today for 110-month follow-up status post whole breast radiation to her right breast for stage I ER PR positive invasive mammary carcinoma.  REFERRING PROVIDER: Jinny Sanders, MD  HPI: Patient is a 68 year old female now at 6 months having completed whole breast radiation to her right breast for stage I ER PR positive invasive mammary carcinoma.  Seen today in routine follow-up she is doing well.  She specifically denies breast tenderness cough or bone pain..  She is currently in arimadex tolerated that well without side effect.  She is scheduled for mammograms in November.  COMPLICATIONS OF TREATMENT: none  FOLLOW UP COMPLIANCE: keeps appointments   PHYSICAL EXAM:  BP (!) 168/93 (BP Location: Left Arm)   Pulse 88   Temp 99.5 F (37.5 C) (Tympanic)   Resp 16   Wt 197 lb 14.4 oz (89.8 kg)   BMI 32.68 kg/m  Lungs are clear to A&P cardiac examination essentially unremarkable with regular rate and rhythm. No dominant mass or nodularity is noted in either breast in 2 positions examined. Incision is well-healed. No axillary or supraclavicular adenopathy is appreciated. Cosmetic result is excellent.  Well-developed well-nourished patient in NAD. HEENT reveals PERLA, EOMI, discs not visualized.  Oral cavity is clear. No oral mucosal lesions are identified. Neck is clear without evidence of cervical or supraclavicular adenopathy. Lungs are clear to A&P. Cardiac examination is essentially unremarkable with regular rate and rhythm without murmur rub or thrill. Abdomen is benign with no organomegaly or masses noted. Motor sensory and DTR levels are equal and symmetric in the upper and lower extremities. Cranial nerves II through XII are grossly intact. Proprioception is intact. No peripheral adenopathy or edema is identified. No motor or sensory  levels are noted. Crude visual fields are within normal range.  RADIOLOGY RESULTS: No current films for review  PLAN: Present time patient is doing well 6 months out from whole breast radiation.  She states she is scheduled for mammograms in November I will review them when they are available.  She also continues on arimadex without side effect.  Patient I have asked to see back in 6 months for follow-up and then will start once a year follow-up visits.  Patient knows to call at anytime with any concerns.  I would like to take this opportunity to thank you for allowing me to participate in the care of your patient.Noreene Filbert, MD

## 2019-09-03 ENCOUNTER — Other Ambulatory Visit: Payer: Self-pay | Admitting: Radiation Oncology

## 2019-09-03 DIAGNOSIS — Z9889 Other specified postprocedural states: Secondary | ICD-10-CM

## 2019-09-03 DIAGNOSIS — Z853 Personal history of malignant neoplasm of breast: Secondary | ICD-10-CM

## 2019-10-06 ENCOUNTER — Ambulatory Visit
Admission: RE | Admit: 2019-10-06 | Discharge: 2019-10-06 | Disposition: A | Discharge status: Home / Self Care | Payer: PPO | Source: Ambulatory Visit | Attending: Radiation Oncology | Admitting: Radiation Oncology

## 2019-10-06 ENCOUNTER — Other Ambulatory Visit: Payer: Self-pay

## 2019-10-06 DIAGNOSIS — Z853 Personal history of malignant neoplasm of breast: Secondary | ICD-10-CM

## 2019-10-06 DIAGNOSIS — Z9889 Other specified postprocedural states: Secondary | ICD-10-CM

## 2019-10-06 DIAGNOSIS — R928 Other abnormal and inconclusive findings on diagnostic imaging of breast: Secondary | ICD-10-CM | POA: Diagnosis not present

## 2019-11-10 DIAGNOSIS — Z803 Family history of malignant neoplasm of breast: Secondary | ICD-10-CM | POA: Diagnosis not present

## 2019-11-10 DIAGNOSIS — C50211 Malignant neoplasm of upper-inner quadrant of right female breast: Secondary | ICD-10-CM | POA: Diagnosis not present

## 2019-11-10 DIAGNOSIS — Z853 Personal history of malignant neoplasm of breast: Secondary | ICD-10-CM | POA: Diagnosis not present

## 2019-12-01 ENCOUNTER — Ambulatory Visit: Payer: PPO | Admitting: Oncology

## 2019-12-01 DIAGNOSIS — K648 Other hemorrhoids: Secondary | ICD-10-CM | POA: Diagnosis not present

## 2019-12-01 DIAGNOSIS — K5730 Diverticulosis of large intestine without perforation or abscess without bleeding: Secondary | ICD-10-CM | POA: Diagnosis not present

## 2019-12-01 DIAGNOSIS — Z1211 Encounter for screening for malignant neoplasm of colon: Secondary | ICD-10-CM | POA: Diagnosis not present

## 2019-12-01 LAB — HM COLONOSCOPY

## 2019-12-02 ENCOUNTER — Encounter: Payer: Self-pay | Admitting: Family Medicine

## 2019-12-25 ENCOUNTER — Inpatient Hospital Stay: Payer: PPO | Attending: Oncology | Admitting: Oncology

## 2019-12-25 ENCOUNTER — Other Ambulatory Visit: Payer: Self-pay

## 2019-12-25 ENCOUNTER — Encounter: Payer: Self-pay | Admitting: Oncology

## 2019-12-25 VITALS — BP 163/90 | HR 71 | Temp 98.4°F | Ht 66.0 in | Wt 196.0 lb

## 2019-12-25 DIAGNOSIS — Z5181 Encounter for therapeutic drug level monitoring: Secondary | ICD-10-CM

## 2019-12-25 DIAGNOSIS — C50912 Malignant neoplasm of unspecified site of left female breast: Secondary | ICD-10-CM | POA: Diagnosis not present

## 2019-12-25 DIAGNOSIS — Z79899 Other long term (current) drug therapy: Secondary | ICD-10-CM | POA: Diagnosis not present

## 2019-12-25 DIAGNOSIS — Z833 Family history of diabetes mellitus: Secondary | ICD-10-CM | POA: Insufficient documentation

## 2019-12-25 DIAGNOSIS — Z8249 Family history of ischemic heart disease and other diseases of the circulatory system: Secondary | ICD-10-CM | POA: Insufficient documentation

## 2019-12-25 DIAGNOSIS — Z923 Personal history of irradiation: Secondary | ICD-10-CM | POA: Diagnosis not present

## 2019-12-25 DIAGNOSIS — Z803 Family history of malignant neoplasm of breast: Secondary | ICD-10-CM | POA: Insufficient documentation

## 2019-12-25 DIAGNOSIS — C50911 Malignant neoplasm of unspecified site of right female breast: Secondary | ICD-10-CM | POA: Insufficient documentation

## 2019-12-25 DIAGNOSIS — Z9221 Personal history of antineoplastic chemotherapy: Secondary | ICD-10-CM | POA: Insufficient documentation

## 2019-12-25 DIAGNOSIS — Z08 Encounter for follow-up examination after completed treatment for malignant neoplasm: Secondary | ICD-10-CM

## 2019-12-25 DIAGNOSIS — Z853 Personal history of malignant neoplasm of breast: Secondary | ICD-10-CM

## 2019-12-25 DIAGNOSIS — Z90721 Acquired absence of ovaries, unilateral: Secondary | ICD-10-CM | POA: Insufficient documentation

## 2019-12-25 DIAGNOSIS — Z79811 Long term (current) use of aromatase inhibitors: Secondary | ICD-10-CM | POA: Diagnosis not present

## 2019-12-25 DIAGNOSIS — Z8349 Family history of other endocrine, nutritional and metabolic diseases: Secondary | ICD-10-CM | POA: Diagnosis not present

## 2019-12-25 DIAGNOSIS — Z170 Estrogen receptor positive status [ER+]: Secondary | ICD-10-CM | POA: Diagnosis not present

## 2019-12-25 DIAGNOSIS — Z7982 Long term (current) use of aspirin: Secondary | ICD-10-CM | POA: Diagnosis not present

## 2019-12-25 NOTE — Progress Notes (Signed)
Patient stated that she had been doing well with no complaints. Patient's mammogram was done on 10/06/2019 and Bone Density on 10/04/2018.

## 2019-12-26 NOTE — Progress Notes (Signed)
Hematology/Oncology Consult note [facility]  Telephone:([phone] [phone]  Patient Care Team: Jinny Sanders, MD as PCP - General Magrinat, Virgie Dad, MD as Consulting Physician (Oncology) Fanny Skates, MD as Consulting Physician (General Surgery) Noreene Filbert, MD as Referring Physician (Radiation Oncology) Lloyd Huger, MD as Consulting Physician (Oncology)   Name of the patient: Bridget Cummings  [id number]  04/01/1951   Date of visit: 12/26/19  Diagnosis- invasive mammary carcinoma of the right breast pathological prognostic stage IApT1 ppN0 cM0, grade 1 ER PR positive HER-2/neu negative status post lumpectomy and adjuvant radiation treatment  Chief complaint/ Reason for visit-routine follow-up of breast cancer on Arimidex  Heme/Onc history: patient is a 69 year old female who was diagnosed with left breast cancer in November 2008. Pathology at that time showed a 1.7 cm DCIS that was ER positive. She underwent external beam radiation and took tamoxifen for 4 years. Recently she was found to have an abnormality on routine screening mammogram in November 2019. Suspicious mass was noted in the right breast 5 cm from the nipple measuring about 7 mm by ultrasound. Right axilla was unremarkable. She underwent a lumpectomy and sentinel lymph node biopsy by Dr. Dalbert Batman. Final pathology showed 8 mm grade 1 invasive mammary carcinoma with negative margins. 3 out of 3 sentinel lymph nodes were negative. Tumor was strongly ER PR positive 100% and HER-2/neu negative. Ki-67 was 2%. Patient was seen by Dr. Donella Stade and has also completed adjuvant radiation treatment to her right breast. She is here to discuss further management. She is also undergone genetic testing which was negative. Patient is retired but is active and swims and bikes daily. She has had a bilateral nephrectomy in 2009 but still has a uterus.  Lumpectomy on  11/04/2018 showed invasive and in situ ductal carcinoma 0.8 cm. Negative margins. 3 sentinel lymph nodes were negative for malignancy. Overall grade 1. ER 100% positive PR 100% positive HER-2/neu negative and Ki-67 2%. Patient completed adjuvant radiation treatment and was started on Arimidex in February 2020  Interval history-patient is doing well and tolerating Arimidex well without any significant side effects.  She has also been taking calcium and vitamin D.  ECOG PS- 0 Pain scale- 0   Review of systems- Review of Systems  Constitutional: Negative for chills, fever, malaise/fatigue and weight loss.  HENT: Negative for congestion, ear discharge and nosebleeds.   Eyes: Negative for blurred vision.  Respiratory: Negative for cough, hemoptysis, sputum production, shortness of breath and wheezing.   Cardiovascular: Negative for chest pain, palpitations, orthopnea and claudication.  Gastrointestinal: Negative for abdominal pain, blood in stool, constipation, diarrhea, heartburn, melena, nausea and vomiting.  Genitourinary: Negative for dysuria, flank pain, frequency, hematuria and urgency.  Musculoskeletal: Negative for back pain, joint pain and myalgias.  Skin: Negative for rash.  Neurological: Negative for dizziness, tingling, focal weakness, seizures, weakness and headaches.  Endo/Heme/Allergies: Does not bruise/bleed easily.  Psychiatric/Behavioral: Negative for depression and suicidal ideas. The patient does not have insomnia.       No Known Allergies   Past Medical History:  Diagnosis Date  . Allergy   . Breast cancer [facility]) 2008   Left Breast Cancer  . Breast cancer ([place]) 2019   Right Breast Cancer  . Family history of breast cancer   . Hyperlipidemia   . Personal history of radiation therapy 2008   Left Breast Cancer  . Personal history of radiation therapy 2019   Right Breast Cancer  Past Surgical History:  Procedure Laterality Date  . BREAST BIOPSY    .  BREAST LUMPECTOMY Left 2008  . BREAST LUMPECTOMY Right 11/04/2018  . BREAST LUMPECTOMY WITH RADIOACTIVE SEED AND SENTINEL LYMPH NODE BIOPSY Right 11/04/2018   Procedure: RIGHT BREAST LUMPECTOMY WITH RADIOACTIVE SEED AND RIGHT AXILLARY SENTINEL LYMPH NODE BIOPSY;  Surgeon: Fanny Skates, MD;  Location: [facility];  Service: General;  Laterality: Right;  . FOOT SURGERY     x5 total  . OVARY REMOVED  2009  . TONSILLECTOMY      Social History   Socioeconomic History  . Marital status: Married    Spouse name: Not on file  . Number of children: 2  . Years of education: Not on file  . Highest education level: Not on file  Occupational History  . 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    Employer: [employer]  Tobacco Use  . Smoking status: Never Smoker  . Smokeless tobacco: Never Used  Substance and Sexual Activity  . Alcohol use: No  . Drug use: No  . Sexual activity: Yes  Other Topics Concern  . Not on file  Social History Narrative   Regular exercise-yes, walks 45 minutes every day   Diet: fruit and veggies and chicken avoiding salt   Social Determinants of Health   Financial Resource Strain:   . Difficulty of Paying Living Expenses: Not on file  Food Insecurity:   . Worried About Charity fundraiser in the Last Year: Not on file  . Ran Out of Food in the Last Year: Not on file  Transportation Needs:   . Lack of Transportation (Medical): Not on file  . Lack of Transportation (Non-Medical): Not on file  Physical Activity:   . Days of Exercise per Week: Not on file  . Minutes of Exercise per Session: Not on file  Stress:   . Feeling of Stress : Not on file  Social Connections:   . Frequency of Communication with Friends and Family: Not on file  . Frequency of Social Gatherings with Friends and Family: Not on file  . Attends Religious Services: Not on file  . Active Member of Clubs or Organizations: Not on file  . Attends Archivist Meetings: Not on file  . Marital  Status: Not on file  Intimate Partner Violence:   . Fear of Current or Ex-Partner: Not on file  . Emotionally Abused: Not on file  . Physically Abused: Not on file  . Sexually Abused: Not on file    Family History  Problem Relation Age of Onset  . Breast cancer Mother 58  . Diabetes Father   . Breast cancer Sister 38  . Breast cancer Maternal Aunt 72  . Heart attack Maternal Grandmother 75  . Breast cancer Cousin        are dx unk     Current Outpatient Medications:  .  anastrozole (ARIMIDEX) 1 MG tablet, Take 1 tablet (1 mg total) by mouth daily., Disp: 90 tablet, Rfl: 3 .  aspirin 81 MG tablet, Take 81 mg by mouth at bedtime. , Disp: , Rfl:  .  Calcium Carb-Cholecalciferol (CALCIUM + VITAMIN D3 PO), Take 2 tablets by mouth daily., Disp: , Rfl:  .  Omega-3 Fatty Acids (FISH OIL) 1000 MG CAPS, Take 1 capsule by mouth 1 day or 1 dose., Disp: , Rfl:  .  polyethylene glycol (MIRALAX / GLYCOLAX) packet, Take 17 g by mouth 3 (three) times a week. , Disp: , Rfl:  .  pravastatin (PRAVACHOL) 20 MG tablet, TAKE 1 TABLET(20 MG) BY MOUTH DAILY, Disp: 90 tablet, Rfl: 3 .  vitamin B-12 (CYANOCOBALAMIN) 1000 MCG tablet, Take 1,000 mcg by mouth daily., Disp: , Rfl:   Physical exam:  Vitals:   12/25/19 1011  BP: (!) 163/90  Pulse: 71  Temp: 98.4 F (36.9 C)  TempSrc: Tympanic  Weight: 196 lb (88.9 kg)  Height: '5\' 6"'  (1.676 m)   Physical Exam Constitutional:      General: She is not in acute distress. HENT:     Head: Normocephalic and atraumatic.  Eyes:     Pupils: Pupils are equal, round, and reactive to light.  Cardiovascular:     Rate and Rhythm: Normal rate and regular rhythm.     Heart sounds: Normal heart sounds.  Pulmonary:     Effort: Pulmonary effort is normal.     Breath sounds: Normal breath sounds.  Abdominal:     General: Bowel sounds are normal.     Palpations: Abdomen is soft.  Musculoskeletal:     Cervical back: Normal range of motion.  Skin:    General: Skin  is warm and dry.  Neurological:     Mental Status: She is alert and oriented to person, place, and time.    Breast exam was performed in seated and lying down position. Patient is status post right lumpectomy with a well-healed surgical scar. No evidence of any palpable masses. No evidence of axillary adenopathy. No evidence of any palpable masses or lumps in the left breast. No evidence of leftt axillary adenopathy   CMP Latest Ref Rng & Units 05/29/2019  Glucose 70 - 99 mg/dL 105(H)  BUN 8 - 23 mg/dL 19  Creatinine 0.44 - 1.00 mg/dL 1.13(H)  Sodium 135 - 145 mmol/L 141  Potassium 3.5 - 5.1 mmol/L 4.2  Chloride 98 - 111 mmol/L 107  CO2 22 - 32 mmol/L 26  Calcium 8.9 - 10.3 mg/dL 8.8(L)  Total Protein 6.5 - 8.1 g/dL 7.0  Total Bilirubin 0.3 - 1.2 mg/dL 0.8  Alkaline Phos 38 - 126 U/L 83  AST 15 - 41 U/L 21  ALT 0 - 44 U/L 22   CBC Latest Ref Rng & Units 05/29/2019  WBC 4.0 - 10.5 K/uL 5.7  Hemoglobin 12.0 - 15.0 g/dL 14.7  Hematocrit 36.0 - 46.0 % 44.0  Platelets 150 - 400 K/uL 243     Assessment and plan- Patient is a 69 y.o. female withinvasive mammary carcinoma of the right breast pathological prognostic stage IApT1 pN0 cM0, grade 1 ER PR positive HER-2/neu negative status post lumpectomy and adjuvant radiation treatment.  She is currently on Arimidex and this is a routine follow-up visit for breast cancer  Clinically patient is doing well and tolerating Arimidex without any significant side effects.She had her recent mammogram on 10/06/2019 which did not show any evidence of malignancy.  Patient will continue taking Arimidex for 5 years.  I will see her back in 6 months without labs for an in person breast exam   Visit Diagnosis 1. Encounter for follow-up surveillance of breast cancer   2. Visit for monitoring Arimidex therapy      Dr. Randa Evens, MD, MPH [facility] at [facility] [phone] 12/26/2019 12:17 PM

## 2019-12-27 ENCOUNTER — Other Ambulatory Visit: Payer: Self-pay | Admitting: Oncology

## 2020-02-02 ENCOUNTER — Telehealth: Payer: Self-pay

## 2020-02-02 NOTE — Telephone Encounter (Signed)
Pt said having difficulty getting refill for [facility]astatin [facility] [employer]. Pt last refilled on 11/10/19. I spoke with Verline Lema at [facility] and pt has available refill and they will get ready for pick up. Pt voiced understanding and will pick up med.

## 2020-03-03 ENCOUNTER — Ambulatory Visit
Admission: RE | Admit: 2020-03-03 | Discharge: 2020-03-03 | Disposition: A | Discharge status: Home / Self Care | Payer: PPO | Source: Ambulatory Visit | Attending: Radiation Oncology | Admitting: Radiation Oncology

## 2020-03-03 ENCOUNTER — Encounter: Payer: Self-pay | Admitting: Radiation Oncology

## 2020-03-03 ENCOUNTER — Other Ambulatory Visit: Payer: Self-pay

## 2020-03-03 VITALS — BP 162/90 | HR 83 | Temp 97.6°F | Resp 20 | Wt 197.8 lb

## 2020-03-03 DIAGNOSIS — Z170 Estrogen receptor positive status [ER+]: Secondary | ICD-10-CM | POA: Diagnosis not present

## 2020-03-03 DIAGNOSIS — Z923 Personal history of irradiation: Secondary | ICD-10-CM | POA: Diagnosis not present

## 2020-03-03 DIAGNOSIS — C50211 Malignant neoplasm of upper-inner quadrant of right female breast: Secondary | ICD-10-CM | POA: Diagnosis not present

## 2020-03-03 DIAGNOSIS — Z79811 Long term (current) use of aromatase inhibitors: Secondary | ICD-10-CM | POA: Diagnosis not present

## 2020-03-03 NOTE — Progress Notes (Signed)
Radiation Oncology Follow up Note  Name: Bridget Cummings   Date:   03/03/2020 MRN:  [id number] DOB: 10-21-1951    This 70 y.o. female presents to the clinic today for 1 year follow-up status post whole breast radiation to her right breast for stage I ER/PR positive invasive mammary carcinoma.  REFERRING PROVIDER: Jinny Sanders, MD  HPI: Patient is a 69 year old female now out 1 year having completed whole breast radiation to her right breast for stage I ER/PR positive invasive mammary carcinoma seen today in routine follow-up she is doing well.  She specifically denies breast tenderness cough or bone pain.  She had mammograms back in Diagnosed 1 extremely rare orthopedic oncologist would be a good choice diagnosis is would be appropriate.  Still do not know was diagnosed with 1 November which were BI-RADS 2 benign which I have reviewed.  November which were BI-RADS 2 benign which I have reviewed.  She is currently on Arimidex tolerating that well without side effect.  COMPLICATIONS OF TREATMENT: none  FOLLOW UP COMPLIANCE: keeps appointments   PHYSICAL EXAM:  BP (!) 162/90   Pulse 83   Temp 97.6 F (36.4 C) (Tympanic)   Resp 20   Wt 197 lb 12.8 oz (89.7 kg)   BMI 31.93 kg/m  Lungs are clear to A&P cardiac examination essentially unremarkable with regular rate and rhythm. No dominant mass or nodularity is noted in either breast in 2 positions examined. Incision is well-healed. No axillary or supraclavicular adenopathy is appreciated. Cosmetic result is excellent.  Well-developed well-nourished patient in NAD. HEENT reveals PERLA, EOMI, discs not visualized.  Oral cavity is clear. No oral mucosal lesions are identified. Neck is clear without evidence of cervical or supraclavicular adenopathy. Lungs are clear to A&P. Cardiac examination is essentially unremarkable with regular rate and rhythm without murmur rub or thrill. Abdomen is benign with no organomegaly or masses noted. Motor  sensory and DTR levels are equal and symmetric in the upper and lower extremities. Cranial nerves II through XII are grossly intact. Proprioception is intact. No peripheral adenopathy or edema is identified. No motor or sensory levels are noted. Crude visual fields are within normal range.  RADIOLOGY RESULTS: Mammograms are reviewed compatible with above-stated findings  PLAN: Present time patient is doing well with no evidence of disease 1 year out and pleased with her overall progress.  She continues on Arimidex without side effect.  I have asked to see her back in 1 year for follow-up.  She knows to call with any concerns at any time.  I would like to take this opportunity to thank you for allowing me to participate in the care of your patient.Noreene Filbert, MD

## 2020-04-30 ENCOUNTER — Other Ambulatory Visit: Payer: Self-pay | Admitting: Family Medicine

## 2020-05-21 ENCOUNTER — Ambulatory Visit (INDEPENDENT_AMBULATORY_CARE_PROVIDER_SITE_OTHER): Payer: PPO | Admitting: Family Medicine

## 2020-05-21 ENCOUNTER — Encounter: Payer: Self-pay | Admitting: Family Medicine

## 2020-05-21 ENCOUNTER — Other Ambulatory Visit: Payer: Self-pay

## 2020-05-21 VITALS — BP 165/84 | HR 88 | Temp 97.8°F | Ht 65.25 in | Wt 199.2 lb

## 2020-05-21 DIAGNOSIS — E785 Hyperlipidemia, unspecified: Secondary | ICD-10-CM

## 2020-05-21 DIAGNOSIS — R030 Elevated blood-pressure reading, without diagnosis of hypertension: Secondary | ICD-10-CM | POA: Diagnosis not present

## 2020-05-21 DIAGNOSIS — Z0000 Encounter for general adult medical examination without abnormal findings: Secondary | ICD-10-CM | POA: Diagnosis not present

## 2020-05-21 LAB — CBC WITH DIFFERENTIAL/PLATELET
Basophils Absolute: 0 10*3/uL (ref 0.0–0.1)
Basophils Relative: 0.8 % (ref 0.0–3.0)
Eosinophils Absolute: 0.2 10*3/uL (ref 0.0–0.7)
Eosinophils Relative: 3.5 % (ref 0.0–5.0)
HCT: 45.5 % (ref 36.0–46.0)
Hemoglobin: 15.4 g/dL — ABNORMAL HIGH (ref 12.0–15.0)
Lymphocytes Relative: 26.1 % (ref 12.0–46.0)
Lymphs Abs: 1.6 10*3/uL (ref 0.7–4.0)
MCHC: 33.8 g/dL (ref 30.0–36.0)
MCV: 92.1 fl (ref 78.0–100.0)
Monocytes Absolute: 0.5 10*3/uL (ref 0.1–1.0)
Monocytes Relative: 8.6 % (ref 3.0–12.0)
Neutro Abs: 3.7 10*3/uL (ref 1.4–7.7)
Neutrophils Relative %: 61 % (ref 43.0–77.0)
Platelets: 255 10*3/uL (ref 150.0–400.0)
RBC: 4.94 Mil/uL (ref 3.87–5.11)
RDW: 13.4 % (ref 11.5–15.5)
WBC: 6.1 10*3/uL (ref 4.0–10.5)

## 2020-05-21 LAB — TSH: TSH: 4.43 u[IU]/mL (ref 0.35–4.50)

## 2020-05-21 NOTE — Patient Instructions (Addendum)
Please stop at the lab to have labs drawn.  Check blood pressure at home over the next week or two.. email measurements to me. We will plan on starting HCTZ if > 140/90.  Keep up healthy lifestyle.

## 2020-05-21 NOTE — Assessment & Plan Note (Signed)
Remains elevated in office... borderline at [facility]. Willl have her follow at home and if > 14/90 will start HCTZ.  Eval for secondary cause as well today with lab work.

## 2020-05-21 NOTE — Assessment & Plan Note (Signed)
Due for re-eval. If not at goal she is open to increasing dose.  needs refill of pravastatin.

## 2020-05-21 NOTE — Progress Notes (Signed)
Chief Complaint  Patient presents with  . Medicare Wellness    History of Present Illness: HPI  The patient presents for annual medicare wellness, complete physical and review of chronic health problems. He/She also has the following acute concerns today: none  I have personally reviewed the Medicare Annual Wellness questionnaire and have noted 1. The patient's medical and social history 2. Their use of alcohol, tobacco or illicit drugs 3. Their current medications and supplements 4. The patient's functional ability including ADL's, fall risks, home safety risks and hearing or visual             impairment. 5. Diet and physical activities 6. Evidence for depression or mood disorders 7.         Updated provider list.  Cognitive evaluation was performed and recorded on pt medicare questionnaire form. The patients weight, height, BMI and visual acuity have been recorded in the chart  I have made referrals, counseling and provided education to the patient based review of the above and I have provided the pt with a written personalized care plan for preventive services.    Documentation of this information was scanned into the electronic record under the media tab.   Advance directives and end of life planning reviewed in detail with patient and documented in EMR. Patient given handout on advance care directives if needed. HCPOA and living will updated if needed.    Office Visit from 05/21/2020 in [place] at [facility] Total Score 0      Hearing Screening   Method: Audiometry   125Hz 250Hz 500Hz 1000Hz 2000Hz 3000Hz 4000Hz 6000Hz 8000Hz  Right ear:   20 40 20  20    Left ear:   _0 Visual Acuity Screening   Right eye Left eye Both eyes  Without correction: 20/20 20/40 20/20  With correction:       Fall Risk  05/21/2020 05/16/2019 02/22/2018  Falls in the past year? 0 0 No    Elevated Cholesterol:  On pravastatin 20 mg daily. Tolerating low  dose. Due for re-eval. Using medications without problems: Muscle aches: none Diet compliance: good Exercise: swimming aerobic 3 times a week, walking daily Other complaints:  She has had recurrent breast cancer in right breast: Lumpectomy on 11/04/2018 showed invasive and in situ ductal carcinoma 0.8 cm. Negative margins. 3 sentinel lymph nodes were negative for malignancy. Overall grade 1. ER 100% positive PR 100% positive HER-2/neu negative and Ki-67 2%. Patient completed adjuvant radiation treatment and was started on Arimidex in February 2020  Hot flashes from arimidex.  BP Readings from Last 3 Encounters:  05/21/20 (!) 165/84  03/03/20 (!) 162/90  12/25/19 (!) 163/90  She feels this is white coat HTN.  Few weeks ago at [facility] 142/?  No  Swelling    This visit occurred during the SARS-CoV-2 public health emergency.  Safety protocols were in place, including screening questions prior to the visit, additional usage of staff PPE, and extensive cleaning of exam room while observing appropriate contact time as indicated for disinfecting solutions.   COVID 19 screen:  No recent travel or known exposure to COVID19 The patient denies respiratory symptoms of COVID 19 at this time. The importance of social distancing was discussed today.     Review of Systems  Constitutional: Negative for chills and fever.  HENT: Negative for congestion and ear pain.   Eyes: Negative for pain and redness.  Respiratory:  Negative for cough and shortness of breath.   Cardiovascular: Negative for chest pain, palpitations and leg swelling.  Gastrointestinal: Negative for abdominal pain, blood in stool, constipation, diarrhea, nausea and vomiting.  Genitourinary: Negative for dysuria.  Musculoskeletal: Negative for falls and myalgias.  Skin: Negative for rash.  Neurological: Negative for dizziness.  Psychiatric/Behavioral: Negative for depression. The patient is not nervous/anxious.       Past  Medical History:  Diagnosis Date  . Allergy   . Breast cancer [facility]) 2008   Left Breast Cancer  . Breast cancer ([place]) 2019   Right Breast Cancer  . Family history of breast cancer   . Hyperlipidemia   . Personal history of radiation therapy 2008   Left Breast Cancer  . Personal history of radiation therapy 2019   Right Breast Cancer    reports that she has never smoked. She has never used smokeless tobacco. She reports that she does not drink alcohol and does not use drugs.   Current Outpatient Medications:  .  anastrozole (ARIMIDEX) 1 MG tablet, TAKE 1 TABLET(1 MG) BY MOUTH DAILY, Disp: 90 tablet, Rfl: 3 .  aspirin 81 MG tablet, Take 81 mg by mouth at bedtime. , Disp: , Rfl:  .  Calcium Carb-Cholecalciferol (CALCIUM + VITAMIN D3 PO), Take 2 tablets by mouth daily., Disp: , Rfl:  .  Omega-3 Fatty Acids (FISH OIL) 1000 MG CAPS, Take 1 capsule by mouth 1 day or 1 dose., Disp: , Rfl:  .  polyethylene glycol (MIRALAX / GLYCOLAX) packet, Take 17 g by mouth 3 (three) times a week. , Disp: , Rfl:  .  pravastatin (PRAVACHOL) 20 MG tablet, TAKE 1 TABLET(20 MG) BY MOUTH DAILY, Disp: 90 tablet, Rfl: 0 .  vitamin B-12 (CYANOCOBALAMIN) 1000 MCG tablet, Take 1,000 mcg by mouth daily., Disp: , Rfl:    Observations/Objective: Blood pressure (!) 165/84, pulse 88, temperature 97.8 F (36.6 C), temperature source Temporal, height 5' 5.25" (1.657 m), weight 199 lb 4 oz (90.4 kg), SpO2 97 %.  Physical Exam Constitutional:      General: She is not in acute distress.    Appearance: Normal appearance. She is well-developed. She is not ill-appearing or toxic-appearing.  HENT:     Head: Normocephalic.     Right Ear: Hearing, tympanic membrane, ear canal and external ear normal.     Left Ear: Hearing, tympanic membrane, ear canal and external ear normal.     Nose: Nose normal.  Eyes:     General: Lids are normal. Lids are everted, no foreign bodies appreciated.     Conjunctiva/sclera: Conjunctivae  normal.     Pupils: Pupils are equal, round, and reactive to light.  Neck:     Thyroid: No thyroid mass or thyromegaly.     Vascular: No carotid bruit.     Trachea: Trachea normal.  Cardiovascular:     Rate and Rhythm: Normal rate and regular rhythm.     Heart sounds: Normal heart sounds, S1 normal and S2 normal. No murmur heard.  No gallop.   Pulmonary:     Effort: Pulmonary effort is normal. No respiratory distress.     Breath sounds: Normal breath sounds. No wheezing, rhonchi or rales.  Abdominal:     General: Bowel sounds are normal. There is no distension or abdominal bruit.     Palpations: Abdomen is soft. There is no fluid wave or mass.     Tenderness: There is no abdominal tenderness. There is no guarding or rebound.  Hernia: No hernia is present.  Musculoskeletal:     Cervical back: Normal range of motion and neck supple.  Lymphadenopathy:     Cervical: No cervical adenopathy.  Skin:    General: Skin is warm and dry.     Findings: No rash.  Neurological:     Mental Status: She is alert.     Cranial Nerves: No cranial nerve deficit.     Sensory: No sensory deficit.  Psychiatric:        Mood and Affect: Mood is not anxious or depressed.        Speech: Speech normal.        Behavior: Behavior normal. Behavior is cooperative.        Judgment: Judgment normal.      Assessment and Plan   Vaccines: uptodate. S/P  COVID vaccine screening mammogram...per onc,  hx of breast cancer neg 10/2019 PAP/DVE: no ovaries due to hx of breast cancer, has uterus.On q5year Pap schedule, last done 2017.. Pap no longer indicated. No DVE indicated, asymptomatic. Colonoscopy 11/2019 Dr. Earlean Shawl, repeat in 10 years. She has had 3 hemorrhoid banding. DEXA;  nml 2019.. plan repeat in 5 years. Refused HIV testing. Hep C: neg  Hyperlipidemia LDL goal <130 Due for re-eval. If not at goal she is open to increasing dose.  needs refill of pravastatin.  Elevated blood pressure reading  without diagnosis of hypertension Remains elevated in office... borderline at [facility]. Willl have her follow at home and if > 14/90 will start HCTZ.  Eval for secondary cause as well today with lab work.   Eliezer Lofts, MD

## 2020-06-01 ENCOUNTER — Telehealth: Payer: Self-pay

## 2020-06-01 NOTE — Telephone Encounter (Signed)
Patient contacted the office with her BP readings.  6/19 - 146 /45 pulse 88 6/21 - 144/83 pulse 83 6/22 - 141/74 pulse 84 6/23 - 136/72 pulse 76 6/24 - 136/74  6/25 - 132/74 pulse 78 6/26 - 128/79 pulse 69 6/27 - 140/76 pulse 73 6/28 - 144/75 pulse 74   Will route to Dr. Diona Browner.

## 2020-06-01 NOTE — Telephone Encounter (Signed)
Call   Most BPs are at goal... will hold on med for now. Continue to follow and call if consistently >150/90

## 2020-06-01 NOTE — Telephone Encounter (Signed)
Left message for Bridget Cummings that Dr. Diona Browner reviewed her BP reading.  Most are at goal.  Will hold on any medication for now.  I ask that she continue to keep a check on her blood pressure and call us if she is consistently running >150/90.

## 2020-06-22 ENCOUNTER — Inpatient Hospital Stay: Payer: PPO

## 2020-06-22 ENCOUNTER — Encounter: Payer: Self-pay | Admitting: Oncology

## 2020-06-22 ENCOUNTER — Inpatient Hospital Stay: Payer: PPO | Attending: Oncology | Admitting: Oncology

## 2020-06-22 ENCOUNTER — Other Ambulatory Visit: Payer: Self-pay

## 2020-06-22 VITALS — BP 164/84 | HR 83 | Temp 99.7°F | Wt 201.8 lb

## 2020-06-22 DIAGNOSIS — Z79811 Long term (current) use of aromatase inhibitors: Secondary | ICD-10-CM | POA: Diagnosis not present

## 2020-06-22 DIAGNOSIS — Z79899 Other long term (current) drug therapy: Secondary | ICD-10-CM | POA: Insufficient documentation

## 2020-06-22 DIAGNOSIS — Z8249 Family history of ischemic heart disease and other diseases of the circulatory system: Secondary | ICD-10-CM | POA: Insufficient documentation

## 2020-06-22 DIAGNOSIS — Z170 Estrogen receptor positive status [ER+]: Secondary | ICD-10-CM | POA: Diagnosis not present

## 2020-06-22 DIAGNOSIS — Z923 Personal history of irradiation: Secondary | ICD-10-CM | POA: Insufficient documentation

## 2020-06-22 DIAGNOSIS — Z833 Family history of diabetes mellitus: Secondary | ICD-10-CM | POA: Insufficient documentation

## 2020-06-22 DIAGNOSIS — Z7982 Long term (current) use of aspirin: Secondary | ICD-10-CM | POA: Diagnosis not present

## 2020-06-22 DIAGNOSIS — E785 Hyperlipidemia, unspecified: Secondary | ICD-10-CM | POA: Insufficient documentation

## 2020-06-22 DIAGNOSIS — Z5181 Encounter for therapeutic drug level monitoring: Secondary | ICD-10-CM

## 2020-06-22 DIAGNOSIS — C50211 Malignant neoplasm of upper-inner quadrant of right female breast: Secondary | ICD-10-CM | POA: Insufficient documentation

## 2020-06-22 DIAGNOSIS — Z8349 Family history of other endocrine, nutritional and metabolic diseases: Secondary | ICD-10-CM | POA: Insufficient documentation

## 2020-06-22 DIAGNOSIS — Z08 Encounter for follow-up examination after completed treatment for malignant neoplasm: Secondary | ICD-10-CM

## 2020-06-22 DIAGNOSIS — Z853 Personal history of malignant neoplasm of breast: Secondary | ICD-10-CM | POA: Diagnosis not present

## 2020-06-22 DIAGNOSIS — Z803 Family history of malignant neoplasm of breast: Secondary | ICD-10-CM | POA: Insufficient documentation

## 2020-06-22 LAB — LIPID PANEL
Cholesterol: 246 mg/dL — ABNORMAL HIGH (ref 0–200)
HDL: 55 mg/dL (ref >40)
LDL Cholesterol: 154 mg/dL — ABNORMAL HIGH (ref 0–99)
Total CHOL/HDL Ratio: 4.5 RATIO
Triglycerides: 183 mg/dL — ABNORMAL HIGH (ref <150)
VLDL: 37 mg/dL (ref 0–40)

## 2020-06-22 LAB — COMPREHENSIVE METABOLIC PANEL
ALT: 36 U/L (ref 0–44)
AST: 29 U/L (ref 15–41)
Albumin: 4.1 g/dL (ref 3.5–5.0)
Alkaline Phosphatase: 100 U/L (ref 38–126)
Anion gap: 9 (ref 5–15)
BUN: 21 mg/dL (ref 8–23)
CO2: 26 mmol/L (ref 22–32)
Calcium: 9.5 mg/dL (ref 8.9–10.3)
Chloride: 104 mmol/L (ref 98–111)
Creatinine, Ser: 1.14 mg/dL — ABNORMAL HIGH (ref 0.44–1.00)
GFR calc Af Amer: 57 mL/min — ABNORMAL LOW (ref >60)
GFR calc non Af Amer: 49 mL/min — ABNORMAL LOW (ref >60)
Glucose, Bld: 106 mg/dL — ABNORMAL HIGH (ref 70–99)
Potassium: 4.2 mmol/L (ref 3.5–5.1)
Sodium: 139 mmol/L (ref 135–145)
Total Bilirubin: 0.9 mg/dL (ref 0.3–1.2)
Total Protein: 7.4 g/dL (ref 6.5–8.1)

## 2020-06-22 NOTE — Progress Notes (Signed)
Hematology/Oncology Consult note [facility]  Telephone:([phone] [phone]  Patient Care Team: Jinny Sanders, MD as PCP - General Magrinat, Virgie Dad, MD as Consulting Physician (Oncology) Fanny Skates, MD as Consulting Physician (General Surgery) Noreene Filbert, MD as Referring Physician (Radiation Oncology) Lloyd Huger, MD as Consulting Physician (Oncology)   Name of the patient: Bridget Cummings  [id number]  1951-11-04   Date of visit: 06/22/20  Diagnosis- invasive mammary carcinoma of the right breast pathological prognostic stage IApT1 ppN0 cM0, grade 1 ER PR positive HER-2/neu negative status post lumpectomy and adjuvant radiation treatment  Chief complaint/ Reason for visit-routine follow-up of breast cancer on Arimidex  Heme/Onc history: patient is a 69 year old female who was diagnosed with left breast cancer in November 2008. Pathology at that time showed a 1.7 cm DCIS that was ER positive. She underwent external beam radiation and took tamoxifen for 4 years.  Patient found to have stage I invasive mammary carcinoma ER/PR positive HER-2/neu -8 mm grade 1 in the right breast s/p lumpectomy and adjuvant radiation treatment.  She did not require adjuvant chemotherapy.  She started on Arimidex in February 2020.  She is s/p bilateral oophorectomy in 2009 and has an intact uterus.  Interval history-patient reports that her blood pressure readings have been high since she started Arimidex.  Has occasional hot flashes at night which are self-limited.  Occasional joint pain.  Denies other complaints at this time  ECOG PS- 1 Pain scale- 0   Review of systems- Review of Systems  Constitutional: Positive for malaise/fatigue. Negative for chills, fever and weight loss.  HENT: Negative for congestion, ear discharge and nosebleeds.   Eyes: Negative for blurred vision.  Respiratory: Negative for cough, hemoptysis, sputum production,  shortness of breath and wheezing.   Cardiovascular: Negative for chest pain, palpitations, orthopnea and claudication.  Gastrointestinal: Negative for abdominal pain, blood in stool, constipation, diarrhea, heartburn, melena, nausea and vomiting.  Genitourinary: Negative for dysuria, flank pain, frequency, hematuria and urgency.  Musculoskeletal: Negative for back pain, joint pain and myalgias.  Skin: Negative for rash.  Neurological: Negative for dizziness, tingling, focal weakness, seizures, weakness and headaches.  Endo/Heme/Allergies: Does not bruise/bleed easily.  Psychiatric/Behavioral: Negative for depression and suicidal ideas. The patient does not have insomnia.       No Known Allergies   Past Medical History:  Diagnosis Date  . Allergy   . Breast cancer [facility]) 2008   Left Breast Cancer  . Breast cancer ([place]) 2019   Right Breast Cancer  . Family history of breast cancer   . Hyperlipidemia   . Personal history of radiation therapy 2008   Left Breast Cancer  . Personal history of radiation therapy 2019   Right Breast Cancer     Past Surgical History:  Procedure Laterality Date  . BREAST BIOPSY    . BREAST LUMPECTOMY Left 2008  . BREAST LUMPECTOMY Right 11/04/2018  . BREAST LUMPECTOMY WITH RADIOACTIVE SEED AND SENTINEL LYMPH NODE BIOPSY Right 11/04/2018   Procedure: RIGHT BREAST LUMPECTOMY WITH RADIOACTIVE SEED AND RIGHT AXILLARY SENTINEL LYMPH NODE BIOPSY;  Surgeon: Fanny Skates, MD;  Location: [facility];  Service: General;  Laterality: Right;  . FOOT SURGERY     x5 total  . OVARY REMOVED  2009  . TONSILLECTOMY      Social History   Socioeconomic History  . Marital status: Married    Spouse name: Not on file  . Number of children: 2  .  Years of education: Not on file  . Highest education level: Not on file  Occupational History  . 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 Smoking status: Never Smoker  . Smokeless tobacco:  Never Used  Vaping Use  . Vaping Use: Never used  Substance and Sexual Activity  . Alcohol use: No  . Drug use: No  . Sexual activity: Yes  Other Topics Concern  . Not on file  Social History Narrative   Regular exercise-yes, walks 45 minutes every day   Diet: fruit and veggies and chicken avoiding salt   Social Determinants of Health   Financial Resource Strain:   . Difficulty of Paying Living Expenses:   Food Insecurity:   . Worried About Charity fundraiser in the Last Year:   . Arboriculturist in the Last Year:   Transportation Needs:   . Film/video editor (Medical):   Marland Kitchen Lack of Transportation (Non-Medical):   Physical Activity:   . Days of Exercise per Week:   . Minutes of Exercise per Session:   Stress:   . Feeling of Stress :   Social Connections:   . Frequency of Communication with Friends and Family:   . Frequency of Social Gatherings with Friends and Family:   . Attends Religious Services:   . Active Member of Clubs or Organizations:   . Attends Archivist Meetings:   Marland Kitchen Marital Status:   Intimate Partner Violence:   . Fear of Current or Ex-Partner:   . Emotionally Abused:   Marland Kitchen Physically Abused:   . Sexually Abused:     Family History  Problem Relation Age of Onset  . Breast cancer Mother 62  . Diabetes Father   . Breast cancer Sister 37  . Breast cancer Maternal Aunt 72  . Heart attack Maternal Grandmother 75  . Breast cancer Cousin        are dx unk     Current Outpatient Medications:  .  anastrozole (ARIMIDEX) 1 MG tablet, TAKE 1 TABLET(1 MG) BY MOUTH DAILY, Disp: 90 tablet, Rfl: 3 .  aspirin 81 MG tablet, Take 81 mg by mouth at bedtime. , Disp: , Rfl:  .  Calcium Carb-Cholecalciferol (CALCIUM + VITAMIN D3 PO), Take 2 tablets by mouth daily., Disp: , Rfl:  .  Omega-3 Fatty Acids (FISH OIL) 1000 MG CAPS, Take 1 capsule by mouth 1 day or 1 dose., Disp: , Rfl:  .  polyethylene glycol (MIRALAX / GLYCOLAX) packet, Take 17 g by mouth 3  (three) times a week. , Disp: , Rfl:  .  pravastatin (PRAVACHOL) 20 MG tablet, TAKE 1 TABLET(20 MG) BY MOUTH DAILY, Disp: 90 tablet, Rfl: 0 .  vitamin B-12 (CYANOCOBALAMIN) 1000 MCG tablet, Take 1,000 mcg by mouth daily., Disp: , Rfl:   Physical exam:  Vitals:   06/22/20 1316  BP: (!) 164/84  Pulse: 83  Temp: 99.7 F (37.6 C)  TempSrc: Tympanic  SpO2: 98%  Weight: 201 lb 12.8 oz (91.5 kg)   Physical Exam Constitutional:      General: She is not in acute distress. Pulmonary:     Effort: Pulmonary effort is normal.  Skin:    General: Skin is warm and dry.  Neurological:     Mental Status: She is alert and oriented to person, place, and time.    Breast exam was performed in seated and lying down position. Patient is status post right lumpectomy with  a well-healed surgical scar. No evidence of any palpable masses. No evidence of axillary adenopathy. No evidence of any palpable masses or lumps in the left breast. No evidence of leftt axillary adenopathy.  Bilateral inverted nipples   CMP Latest Ref Rng & Units 05/29/2019  Glucose 70 - 99 mg/dL 105(H)  BUN 8 - 23 mg/dL 19  Creatinine 0.44 - 1.00 mg/dL 1.13(H)  Sodium 135 - 145 mmol/L 141  Potassium 3.5 - 5.1 mmol/L 4.2  Chloride 98 - 111 mmol/L 107  CO2 22 - 32 mmol/L 26  Calcium 8.9 - 10.3 mg/dL 8.8(L)  Total Protein 6.5 - 8.1 g/dL 7.0  Total Bilirubin 0.3 - 1.2 mg/dL 0.8  Alkaline Phos 38 - 126 U/L 83  AST 15 - 41 U/L 21  ALT 0 - 44 U/L 22   CBC Latest Ref Rng & Units 05/21/2020  WBC 4.0 - 10.5 K/uL 6.1  Hemoglobin 12.0 - 15.0 g/dL 15.4(H)  Hematocrit 36 - 46 % 45.5  Platelets 150 - 400 K/uL 255.0     Assessment and plan- Patient is a 69 y.o. female withinvasive mammary carcinoma of the right breast pathological prognostic stage IApT1 pN0 cM0, grade 1 ER PR positive HER-2/neu negative status post lumpectomy and adjuvant radiation treatment.    She is currently on Arimidex and is a routine follow-up visit  Clinically  patient is doing well with no concerning signs and symptoms of recurrence based on today's exam.  Arimidex can be associated with increased cardiovascular side effects such as hypertension.  Unfortunately there is no other meaningful alternative to Arimidex and all other areas are also associated with similar side effects and it is important for the patient to stay on endocrine therapy given her recurrent breast cancer.  I have asked her to keep a log of her blood pressure at home and check her blood pressure every day preferably in the morning.  If her blood pressure readings are consistently more than 121 systolic she should get in touch with Dr. Diona Browner to see if she needs to go on any antihypertensive medications.  We will be adding the CMP and lipid profile as per Dr. Rometta Emery recommendations.  I will see her back in 6 months.  No labs.   Visit Diagnosis 1. Encounter for follow-up surveillance of breast cancer   2. Visit for monitoring Arimidex therapy   3. Malignant neoplasm of upper-inner quadrant of right breast in female, estrogen receptor positive ([place])   4. Hyperlipidemia LDL goal <130      Dr. Randa Evens, MD, MPH [facility] [phone] 06/22/2020 2:24 PM

## 2020-06-29 ENCOUNTER — Encounter: Payer: Self-pay | Admitting: Family Medicine

## 2020-06-29 ENCOUNTER — Telehealth: Payer: Self-pay | Admitting: *Deleted

## 2020-06-29 ENCOUNTER — Other Ambulatory Visit: Payer: Self-pay | Admitting: Family Medicine

## 2020-06-29 MED ORDER — HYDROCHLOROTHIAZIDE 25 MG PO TABS: 25.0000 mg | ORAL_TABLET | Freq: Every day | ORAL | 11 refills | Status: DC

## 2020-06-29 NOTE — Progress Notes (Signed)
Call  Let pt know rx for HCTZ sent in to [facility]  Follow BP on this and make follow up appt in 2 weeks for re-eval new diagnosis of hypertension. Labs from Dr. Janese Banks reviewed.

## 2020-06-29 NOTE — Telephone Encounter (Signed)
Patient called stating that she was advised to call Dr. Diona Browner if her blood pressure got over 150. Patient stated that she got her a blood pressure kit and has been monitoring her BP. Patient stated  last week her blood pressure was 140/82 and 130/77. Patient stated that her blood pressure yesterday was 156/88 and today 157/79 pulse 69. Patient stated that she was advised by Dr. Diona Browner that she may want to start her on a mild dose of diuretic. Patient stated that she is okay with that. Patient stated that she has not had a headache or any other symptoms. Patient stated that when she had her labs done here a couple of items were not ordered. Patient stated that they were ordered last week by another doctor and she wanted Dr. Diona Browner to know so that she would review them. Pharmacy [facility]

## 2020-06-30 NOTE — Progress Notes (Signed)
Left message for Bridget Cummings to return my call.

## 2020-06-30 NOTE — Telephone Encounter (Signed)
See Orders only encounter from 06/29/2020.

## 2020-06-30 NOTE — Telephone Encounter (Signed)
Pt returned your call Best number [phone]

## 2020-06-30 NOTE — Progress Notes (Signed)
Ms. Schwier notified as instructed by telephone. Patient states understanding.  She will call back to schedule her 2 week follow up.

## 2020-07-08 IMAGING — MG DIGITAL SCREENING BILATERAL MAMMOGRAM WITH CAD
4 series · 4 of 4 positions shown · non-contrast
Comparison: Previous exam(s).

CLINICAL DATA: Screening. Prior history of left breast cancer post
lumpectomy 8333.

EXAM:
DIGITAL SCREENING BILATERAL MAMMOGRAM WITH CAD

[L MLO]
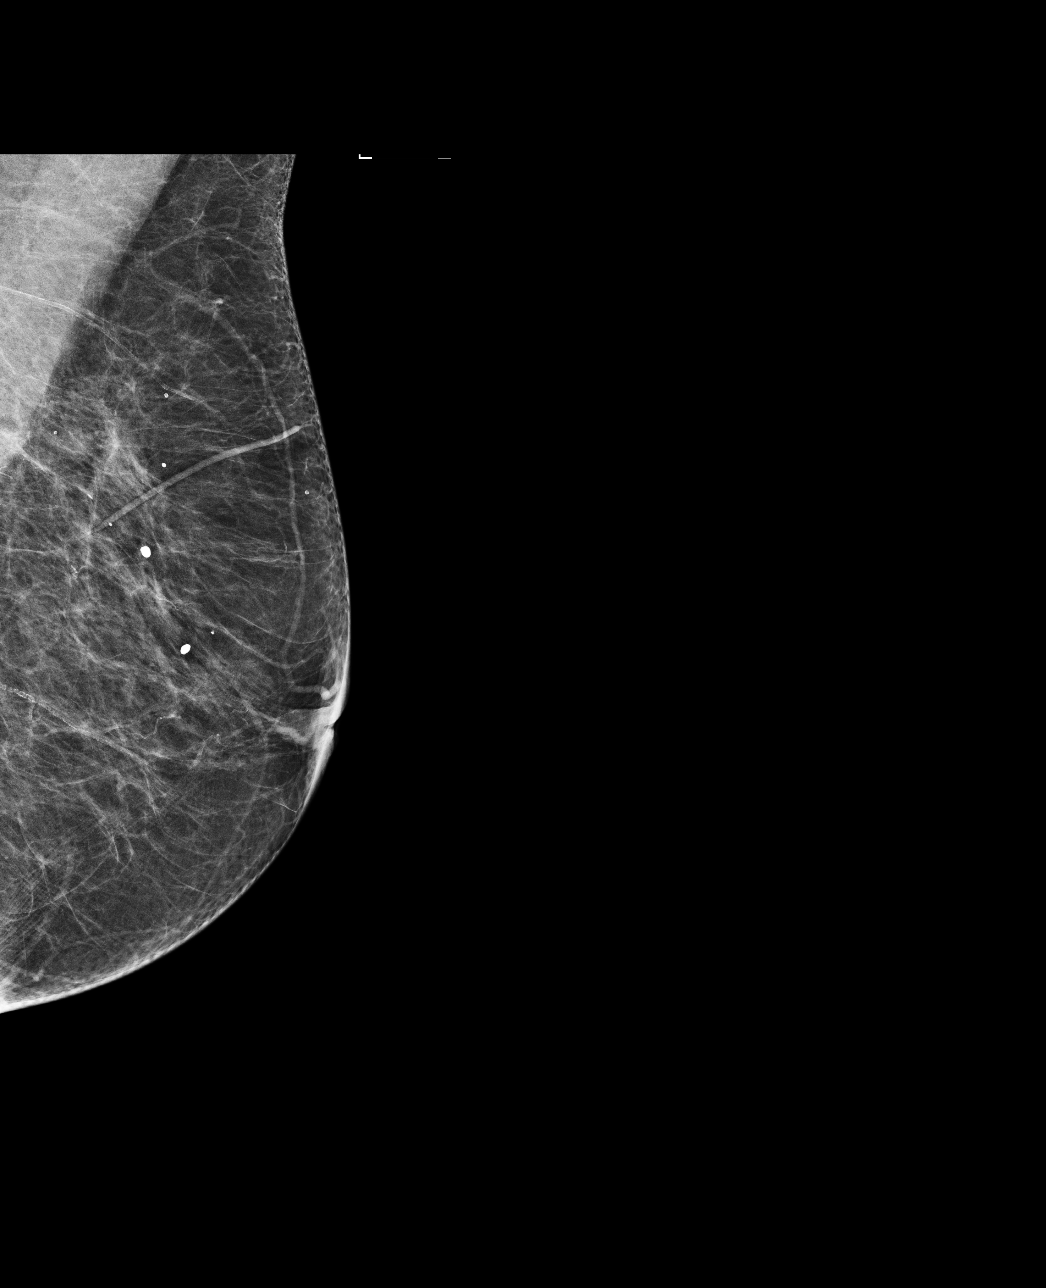

[L CC]
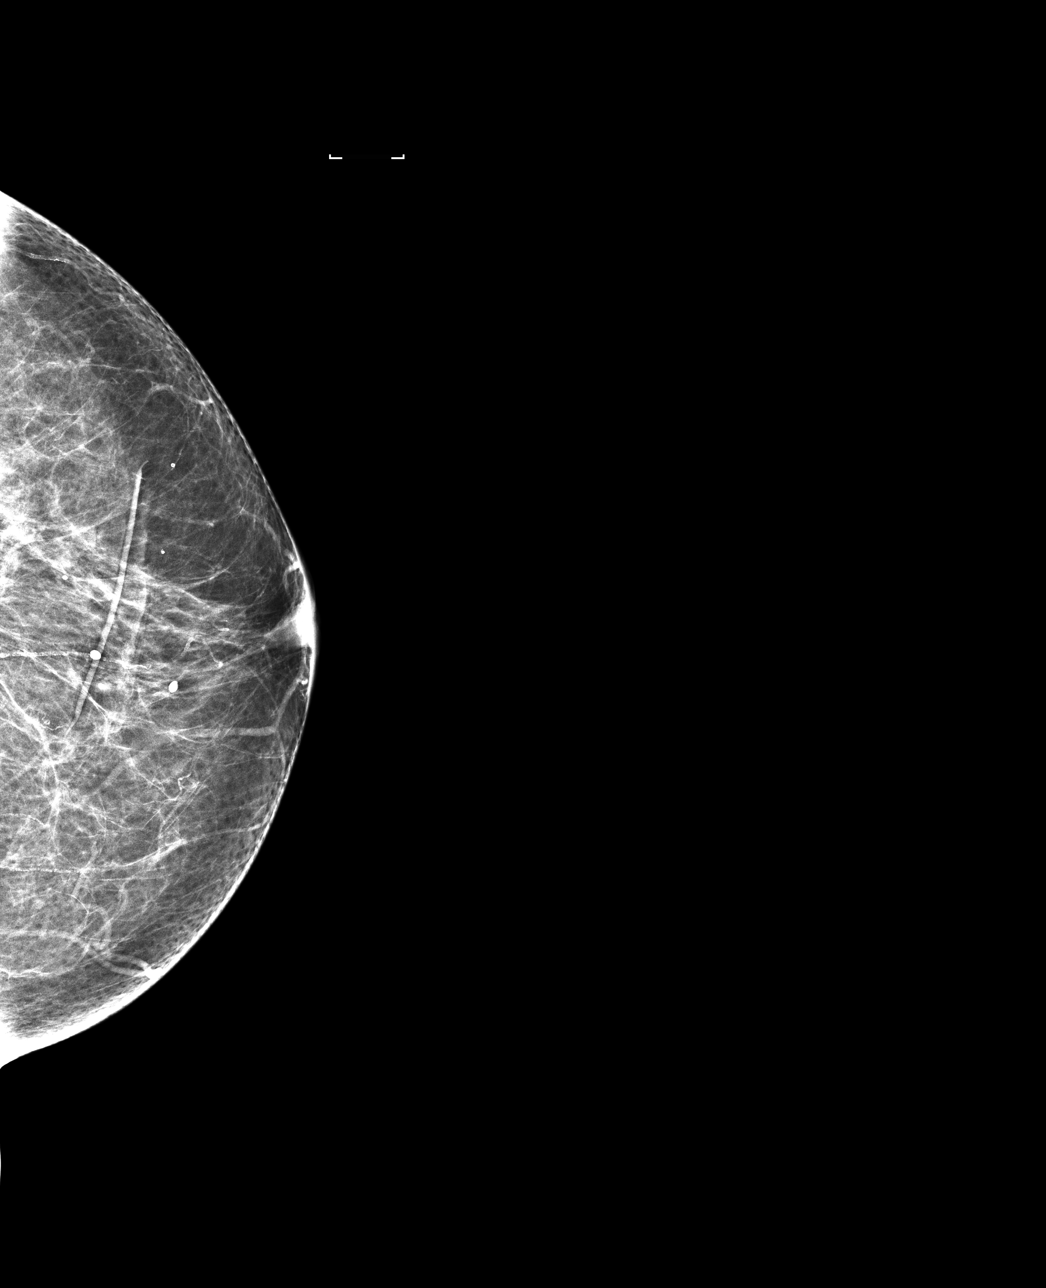

[R CC]
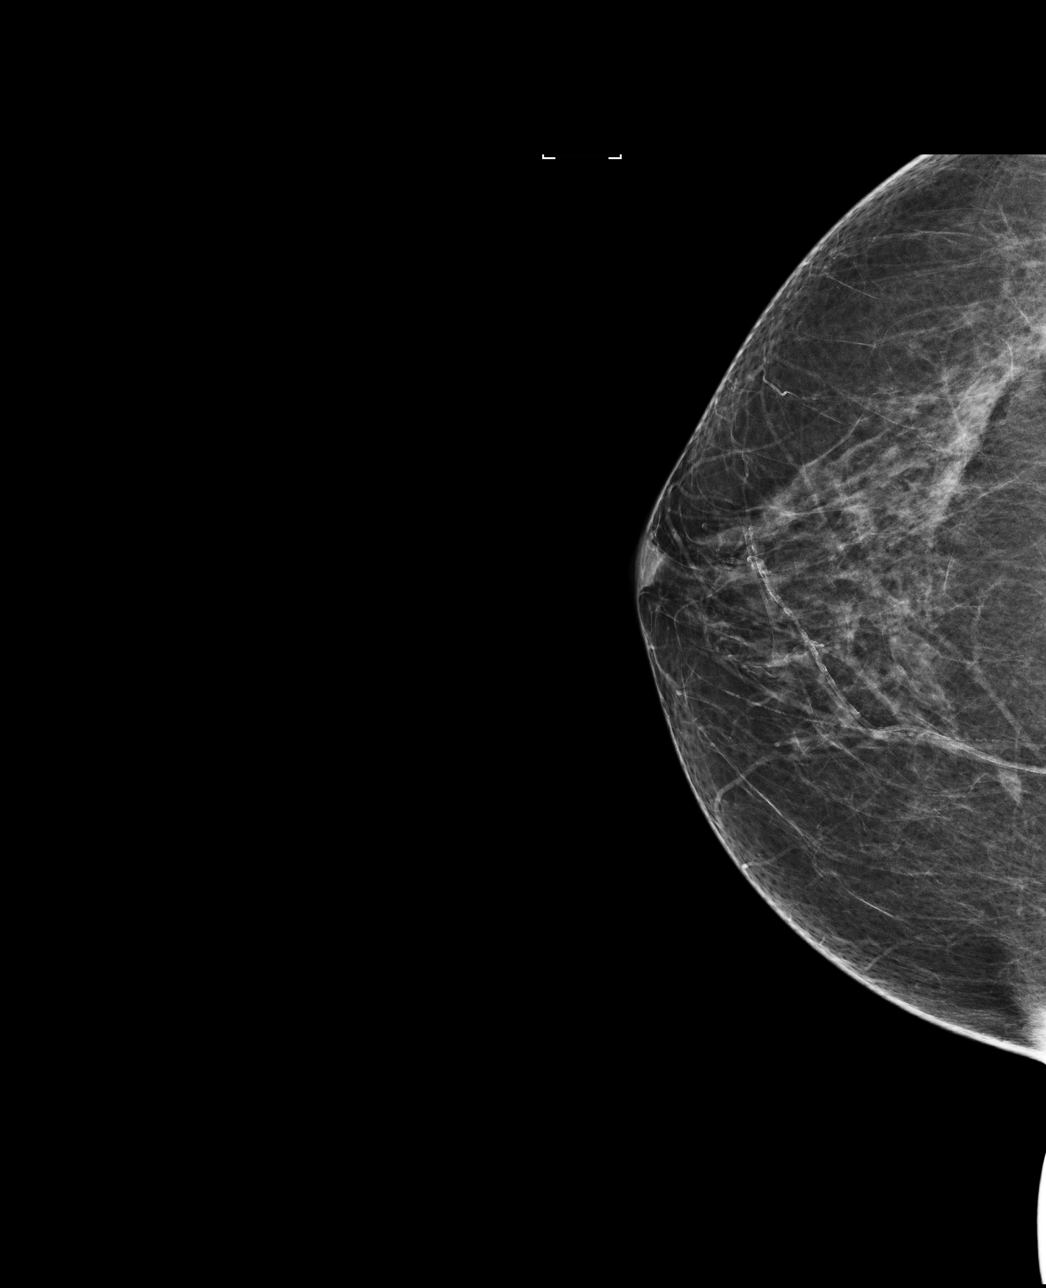

[R MLO]
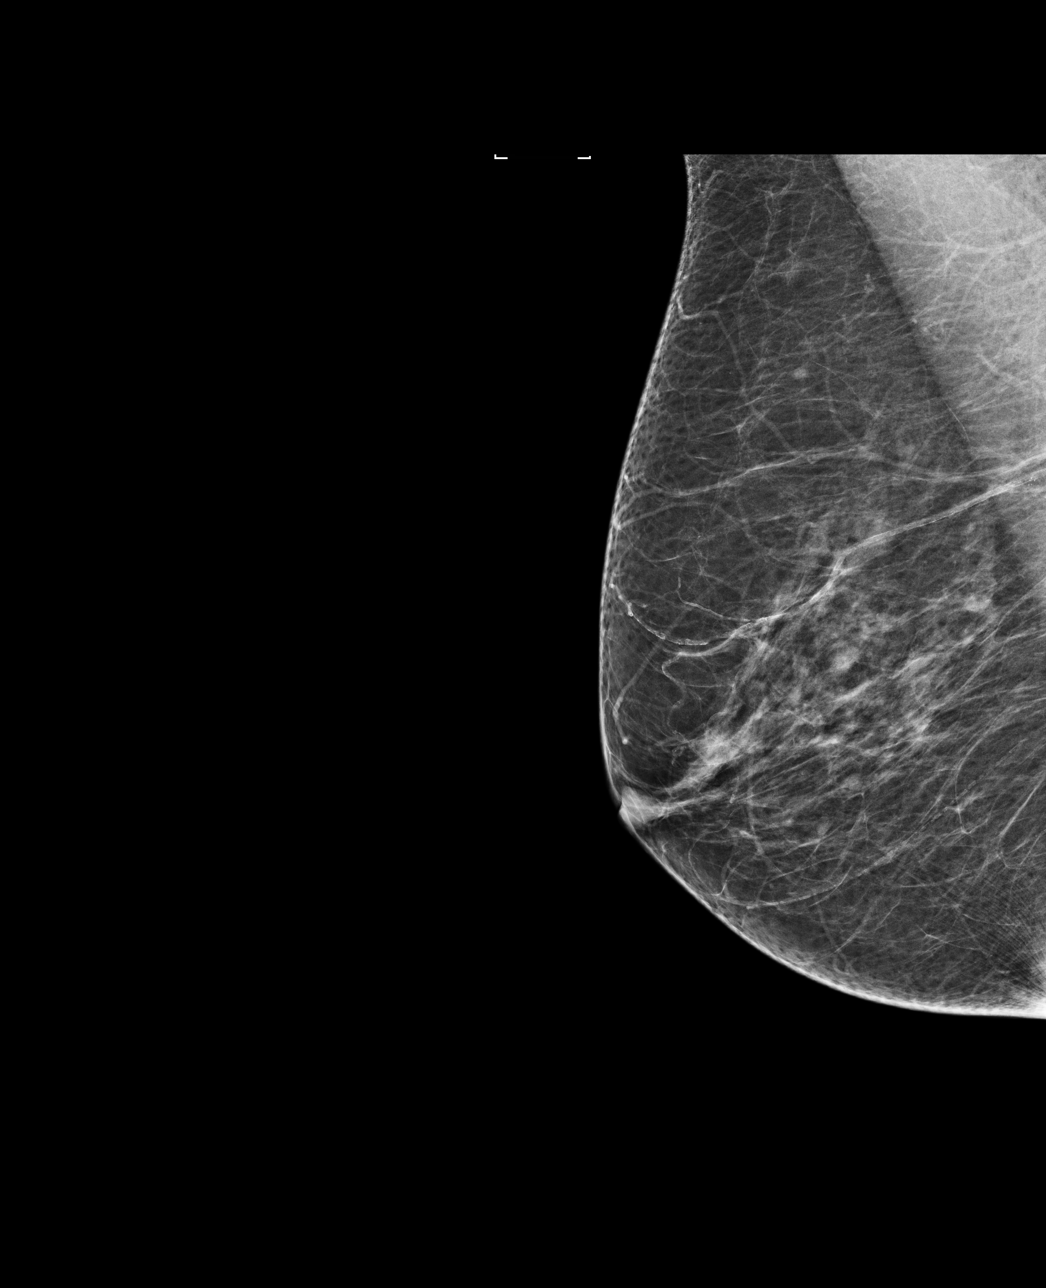

[4 of 4 positions shown; findings below may reference images not displayed]

ACR Breast Density Category b: There are scattered areas of
fibroglandular density.
FINDINGS: In the right breast, a possible asymmetry in the medial posterior
right breast on the CC view warrants further evaluation. In the left
breast, no findings suspicious for malignancy. Images were processed
with CAD.
IMPRESSION: Further evaluation is suggested for possible asymmetry in the right
breast.

RECOMMENDATION:
Diagnostic mammogram and possibly ultrasound of the right breast.
(Code:[id number])

The patient will be contacted regarding the findings, and additional
imaging will be scheduled.

BI-RADS CATEGORY  0: Incomplete. Need additional imaging evaluation
and/or prior mammograms for comparison.

## 2020-07-13 ENCOUNTER — Ambulatory Visit: Payer: PPO | Admitting: Family Medicine

## 2020-07-16 ENCOUNTER — Ambulatory Visit (INDEPENDENT_AMBULATORY_CARE_PROVIDER_SITE_OTHER): Payer: PPO | Admitting: Family Medicine

## 2020-07-16 ENCOUNTER — Other Ambulatory Visit: Payer: Self-pay

## 2020-07-16 VITALS — BP 130/82 | HR 102 | Temp 98.5°F | Ht 65.0 in | Wt 202.5 lb

## 2020-07-16 DIAGNOSIS — I10 Essential (primary) hypertension: Secondary | ICD-10-CM

## 2020-07-16 NOTE — Patient Instructions (Signed)
Continue working on healthy lifestyle.  Check BP once weekly.Marland Kitchen goal < 140/90.  Continue HCTZ.

## 2020-07-16 NOTE — Assessment & Plan Note (Signed)
Persistently elevated at home.. new dx HTN 06/29/2020 Started on HCTZ.  Well controlled. Continue current medication.

## 2020-07-16 NOTE — Progress Notes (Signed)
Chief Complaint  Patient presents with  . Follow-up    BP     History of Present Illness: HPI   69 year old female presetns for follow up HTN.  Hypertension:  She feels she has a component of white coat HTN but measurements may be higher since being on Arimidex. Now on HCTZ two weeks. BP Readings from Last 3 Encounters:  07/16/20 130/82  06/22/20 (!) 164/84  05/21/20 (!) 165/84  Using medication without problems or lightheadedness:  none Chest pain with exertion:none Edema:none Short of breath:none Average home BPs: 129-140/70-80s Other issues:    This visit occurred during the SARS-CoV-2 public health emergency.  Safety protocols were in place, including screening questions prior to the visit, additional usage of staff PPE, and extensive cleaning of exam room while observing appropriate contact time as indicated for disinfecting solutions.   COVID 19 screen:  No recent travel or known exposure to COVID19 The patient denies respiratory symptoms of COVID 19 at this time. The importance of social distancing was discussed today.     ROS    Past Medical History:  Diagnosis Date  . Allergy   . Breast cancer [facility]) 2008   Left Breast Cancer  . Breast cancer ([place]) 2019   Right Breast Cancer  . Family history of breast cancer   . Hyperlipidemia   . Personal history of radiation therapy 2008   Left Breast Cancer  . Personal history of radiation therapy 2019   Right Breast Cancer    reports that she has never smoked. She has never used smokeless tobacco. She reports that she does not drink alcohol and does not use drugs.   Current Outpatient Medications:  .  anastrozole (ARIMIDEX) 1 MG tablet, TAKE 1 TABLET(1 MG) BY MOUTH DAILY, Disp: 90 tablet, Rfl: 3 .  aspirin 81 MG tablet, Take 81 mg by mouth at bedtime. , Disp: , Rfl:  .  Calcium Carb-Cholecalciferol (CALCIUM + VITAMIN D3 PO), Take 2 tablets by mouth daily., Disp: , Rfl:  .  hydrochlorothiazide (HYDRODIURIL) 25 MG  tablet, Take 1 tablet (25 mg total) by mouth daily., Disp: 30 tablet, Rfl: 11 .  Omega-3 Fatty Acids (FISH OIL) 1000 MG CAPS, Take 1 capsule by mouth 1 day or 1 dose., Disp: , Rfl:  .  polyethylene glycol (MIRALAX / GLYCOLAX) packet, Take 17 g by mouth 3 (three) times a week. , Disp: , Rfl:  .  pravastatin (PRAVACHOL) 20 MG tablet, TAKE 1 TABLET(20 MG) BY MOUTH DAILY, Disp: 90 tablet, Rfl: 0 .  vitamin B-12 (CYANOCOBALAMIN) 1000 MCG tablet, Take 1,000 mcg by mouth daily., Disp: , Rfl:    Observations/Objective: Blood pressure 130/82, pulse (!) 102, temperature 98.5 F (36.9 C), temperature source Temporal, height 5\' 5"  (1.651 m), weight 202 lb 8 oz (91.9 kg), SpO2 99 %.  Physical Exam Constitutional:      General: She is not in acute distress.    Appearance: Normal appearance. She is well-developed. She is not ill-appearing or toxic-appearing.  HENT:     Head: Normocephalic.     Right Ear: Hearing, tympanic membrane, ear canal and external ear normal. Tympanic membrane is not erythematous, retracted or bulging.     Left Ear: Hearing, tympanic membrane, ear canal and external ear normal. Tympanic membrane is not erythematous, retracted or bulging.     Nose: No mucosal edema or rhinorrhea.     Right Sinus: No maxillary sinus tenderness or frontal sinus tenderness.     Left Sinus:  No maxillary sinus tenderness or frontal sinus tenderness.     Mouth/Throat:     Pharynx: Uvula midline.  Eyes:     General: Lids are normal. Lids are everted, no foreign bodies appreciated.     Conjunctiva/sclera: Conjunctivae normal.     Pupils: Pupils are equal, round, and reactive to light.  Neck:     Thyroid: No thyroid mass or thyromegaly.     Vascular: No carotid bruit.     Trachea: Trachea normal.  Cardiovascular:     Rate and Rhythm: Normal rate and regular rhythm.     Pulses: Normal pulses.     Heart sounds: Normal heart sounds, S1 normal and S2 normal. No murmur heard.  No friction rub. No gallop.    Pulmonary:     Effort: Pulmonary effort is normal. No tachypnea or respiratory distress.     Breath sounds: Normal breath sounds. No decreased breath sounds, wheezing, rhonchi or rales.  Abdominal:     General: Bowel sounds are normal.     Palpations: Abdomen is soft.     Tenderness: There is no abdominal tenderness.  Musculoskeletal:     Cervical back: Normal range of motion and neck supple.  Skin:    General: Skin is warm and dry.     Findings: No rash.  Neurological:     Mental Status: She is alert.  Psychiatric:        Mood and Affect: Mood is not anxious or depressed.        Speech: Speech normal.        Behavior: Behavior normal. Behavior is cooperative.        Thought Content: Thought content normal.        Judgment: Judgment normal.      Assessment and Plan   Essential hypertension Persistently elevated at home.. new dx HTN 06/29/2020 Started on HCTZ.  Well controlled. Continue current medication.      Eliezer Lofts, MD

## 2020-07-31 ENCOUNTER — Other Ambulatory Visit: Payer: Self-pay | Admitting: Family Medicine

## 2020-09-23 ENCOUNTER — Other Ambulatory Visit: Payer: Self-pay | Admitting: Oncology

## 2020-10-11 ENCOUNTER — Ambulatory Visit
Admission: RE | Admit: 2020-10-11 | Discharge: 2020-10-11 | Disposition: A | Discharge status: Home / Self Care | Payer: PPO | Source: Ambulatory Visit | Attending: Oncology | Admitting: Oncology

## 2020-10-11 ENCOUNTER — Other Ambulatory Visit: Payer: Self-pay

## 2020-10-11 DIAGNOSIS — Z5181 Encounter for therapeutic drug level monitoring: Secondary | ICD-10-CM

## 2020-10-11 DIAGNOSIS — Z170 Estrogen receptor positive status [ER+]: Secondary | ICD-10-CM

## 2020-10-11 DIAGNOSIS — R928 Other abnormal and inconclusive findings on diagnostic imaging of breast: Secondary | ICD-10-CM | POA: Diagnosis not present

## 2020-10-11 DIAGNOSIS — Z08 Encounter for follow-up examination after completed treatment for malignant neoplasm: Secondary | ICD-10-CM

## 2020-12-27 ENCOUNTER — Encounter: Payer: Self-pay | Admitting: Oncology

## 2020-12-27 ENCOUNTER — Inpatient Hospital Stay: Payer: PPO | Attending: Oncology | Admitting: Oncology

## 2020-12-27 VITALS — BP 147/71 | HR 79 | Temp 99.5°F | Resp 18

## 2020-12-27 DIAGNOSIS — Z833 Family history of diabetes mellitus: Secondary | ICD-10-CM | POA: Insufficient documentation

## 2020-12-27 DIAGNOSIS — Z923 Personal history of irradiation: Secondary | ICD-10-CM | POA: Insufficient documentation

## 2020-12-27 DIAGNOSIS — Z08 Encounter for follow-up examination after completed treatment for malignant neoplasm: Secondary | ICD-10-CM

## 2020-12-27 DIAGNOSIS — Z79811 Long term (current) use of aromatase inhibitors: Secondary | ICD-10-CM | POA: Insufficient documentation

## 2020-12-27 DIAGNOSIS — C50211 Malignant neoplasm of upper-inner quadrant of right female breast: Secondary | ICD-10-CM | POA: Diagnosis not present

## 2020-12-27 DIAGNOSIS — Z8349 Family history of other endocrine, nutritional and metabolic diseases: Secondary | ICD-10-CM | POA: Insufficient documentation

## 2020-12-27 DIAGNOSIS — Z7982 Long term (current) use of aspirin: Secondary | ICD-10-CM | POA: Insufficient documentation

## 2020-12-27 DIAGNOSIS — Z853 Personal history of malignant neoplasm of breast: Secondary | ICD-10-CM

## 2020-12-27 DIAGNOSIS — Z5181 Encounter for therapeutic drug level monitoring: Secondary | ICD-10-CM | POA: Diagnosis not present

## 2020-12-27 DIAGNOSIS — Z8249 Family history of ischemic heart disease and other diseases of the circulatory system: Secondary | ICD-10-CM | POA: Diagnosis not present

## 2020-12-27 DIAGNOSIS — Z803 Family history of malignant neoplasm of breast: Secondary | ICD-10-CM | POA: Insufficient documentation

## 2020-12-27 DIAGNOSIS — Z170 Estrogen receptor positive status [ER+]: Secondary | ICD-10-CM | POA: Diagnosis not present

## 2020-12-27 NOTE — Progress Notes (Signed)
Hematology/Oncology Consult note [facility]  Telephone:([phone] [phone]  Patient Care Team: Jinny Sanders, MD as PCP - General Magrinat, Virgie Dad, MD as Consulting Physician (Oncology) Fanny Skates, MD as Consulting Physician (General Surgery) Noreene Filbert, MD as Referring Physician (Radiation Oncology) Lloyd Huger, MD as Consulting Physician (Oncology)   Name of the patient: Bridget Cummings  [id number]  08/30/1951   Date of visit: 12/27/20  Diagnosis- invasive mammary carcinoma of the right breast pathological prognostic stage IApT1 ppN0 cM0, grade 1 ER PR positive HER-2/neu negative status post lumpectomy and adjuvant radiation treatment  Chief complaint/ Reason for visit-routine follow-up of breast cancer on Arimidex  Heme/Onc history: patient is a 70 year old female who was diagnosed with left breast cancer in November 2008. Pathology at that time showed a 1.7 cm DCIS that was ER positive. She underwent external beam radiation and took tamoxifen for 4 years.  Patient found to have stage I invasive mammary carcinoma ER/PR positive HER-2/neu -8 mm grade 1 in the right breast s/p lumpectomy and adjuvant radiation treatment in 2019.  She did not require adjuvant chemotherapy.  She started on Arimidex in February 2020.  She is s/p bilateral oophorectomy in 2009 and has an intact uterus.   Interval history-patient is tolerating Arimidex well without any significant side effects.  She is also taking her calcium and vitamin D as planned.  Denies any breast concerns at this time.  Appetite and weight has remained stable  ECOG PS- 0 Pain scale- 0   Review of systems- Review of Systems  Constitutional: Negative for chills, fever, malaise/fatigue and weight loss.  HENT: Negative for congestion, ear discharge and nosebleeds.   Eyes: Negative for blurred vision.  Respiratory: Negative for cough, hemoptysis, sputum production,  shortness of breath and wheezing.   Cardiovascular: Negative for chest pain, palpitations, orthopnea and claudication.  Gastrointestinal: Negative for abdominal pain, blood in stool, constipation, diarrhea, heartburn, melena, nausea and vomiting.  Genitourinary: Negative for dysuria, flank pain, frequency, hematuria and urgency.  Musculoskeletal: Negative for back pain, joint pain and myalgias.  Skin: Negative for rash.  Neurological: Negative for dizziness, tingling, focal weakness, seizures, weakness and headaches.  Endo/Heme/Allergies: Does not bruise/bleed easily.  Psychiatric/Behavioral: Negative for depression and suicidal ideas. The patient does not have insomnia.       Allergies  Allergen Reactions  . Peppermint Flavor     sneezing     Past Medical History:  Diagnosis Date  . Allergy   . Breast cancer [facility]) 2008   Left Breast Cancer  . Breast cancer ([place]) 2019   Right Breast Cancer  . Family history of breast cancer   . Hyperlipidemia   . Personal history of radiation therapy 2008   Left Breast Cancer  . Personal history of radiation therapy 2019   Right Breast Cancer     Past Surgical History:  Procedure Laterality Date  . BREAST BIOPSY    . BREAST LUMPECTOMY Left 2008  . BREAST LUMPECTOMY Right 11/04/2018  . BREAST LUMPECTOMY WITH RADIOACTIVE SEED AND SENTINEL LYMPH NODE BIOPSY Right 11/04/2018   Procedure: RIGHT BREAST LUMPECTOMY WITH RADIOACTIVE SEED AND RIGHT AXILLARY SENTINEL LYMPH NODE BIOPSY;  Surgeon: Fanny Skates, MD;  Location: [place];  Service: General;  Laterality: Right;  . FOOT SURGERY     x5 total  . OVARY REMOVED  2009  . TONSILLECTOMY      Social History   Socioeconomic History  . Marital status: Married  Spouse name: Not on file  . Number of children: 2  . Years of education: Not on file  . Highest education level: Not on file  Occupational History  . 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    Employer: [employer]  Tobacco Use  .  Smoking status: Never Smoker  . Smokeless tobacco: Never Used  Vaping Use  . Vaping Use: Never used  Substance and Sexual Activity  . Alcohol use: No  . Drug use: No  . Sexual activity: Yes  Other Topics Concern  . Not on file  Social History Narrative   Regular exercise-yes, walks 45 minutes every day   Diet: fruit and veggies and chicken avoiding salt   Social Determinants of Health   Financial Resource Strain: Not on file  Food Insecurity: Not on file  Transportation Needs: Not on file  Physical Activity: Not on file  Stress: Not on file  Social Connections: Not on file  Intimate Partner Violence: Not on file    Family History  Problem Relation Age of Onset  . Breast cancer Mother 6  . Diabetes Father   . Breast cancer Sister 29  . Breast cancer Maternal Aunt 72  . Heart attack Maternal Grandmother 75  . Breast cancer Cousin        are dx unk     Current Outpatient Medications:  .  anastrozole (ARIMIDEX) 1 MG tablet, TAKE 1 TABLET(1 MG) BY MOUTH DAILY, Disp: 90 tablet, Rfl: 3 .  aspirin 81 MG tablet, Take 81 mg by mouth at bedtime., Disp: , Rfl:  .  Calcium Carb-Cholecalciferol (CALCIUM + VITAMIN D3 PO), Take 2 tablets by mouth daily., Disp: , Rfl:  .  hydrochlorothiazide (HYDRODIURIL) 25 MG tablet, Take 1 tablet (25 mg total) by mouth daily., Disp: 30 tablet, Rfl: 11 .  Omega-3 Fatty Acids (FISH OIL) 1000 MG CAPS, Take 1 capsule by mouth 1 day or 1 dose., Disp: , Rfl:  .  polyethylene glycol (MIRALAX / GLYCOLAX) packet, Take 17 g by mouth 3 (three) times a week. , Disp: , Rfl:  .  pravastatin (PRAVACHOL) 20 MG tablet, TAKE 1 TABLET(20 MG) BY MOUTH DAILY, Disp: 90 tablet, Rfl: 3 .  vitamin B-12 (CYANOCOBALAMIN) 1000 MCG tablet, Take 1,000 mcg by mouth daily., Disp: , Rfl:   Physical exam:  Vitals:   12/27/20 1022  BP: (!) 147/71  Pulse: 79  Resp: 18  Temp: 99.5 F (37.5 C)  TempSrc: Tympanic  SpO2: 97%   Physical Exam Constitutional:      General: She  is not in acute distress. Eyes:     Extraocular Movements: EOM normal.  Cardiovascular:     Rate and Rhythm: Normal rate and regular rhythm.     Heart sounds: Normal heart sounds.  Pulmonary:     Effort: Pulmonary effort is normal.     Breath sounds: Normal breath sounds.  Abdominal:     General: Bowel sounds are normal.     Palpations: Abdomen is soft.  Skin:    General: Skin is warm and dry.  Neurological:     Mental Status: She is alert and oriented to person, place, and time.   Breast exam: Patient is s/p bilateral lumpectomy with a well-healed surgical scar.  No palpable masses in either breast no palpable bilateral axillary adenopathy  CMP Latest Ref Rng & Units 06/22/2020  Glucose 70 - 99 mg/dL 106(H)  BUN 8 - 23 mg/dL 21  Creatinine 0.44 - 1.00 mg/dL 1.14(H)  Sodium 135 -  145 mmol/L 139  Potassium 3.5 - 5.1 mmol/L 4.2  Chloride 98 - 111 mmol/L 104  CO2 22 - 32 mmol/L 26  Calcium 8.9 - 10.3 mg/dL 9.5  Total Protein 6.5 - 8.1 g/dL 7.4  Total Bilirubin 0.3 - 1.2 mg/dL 0.9  Alkaline Phos 38 - 126 U/L 100  AST 15 - 41 U/L 29  ALT 0 - 44 U/L 36   CBC Latest Ref Rng & Units 05/21/2020  WBC 4.0 - 10.5 K/uL 6.1  Hemoglobin 12.0 - 15.0 g/dL 15.4(H)  Hematocrit 36.0 - 46.0 % 45.5  Platelets 150.0 - 400.0 K/uL 255.0      Assessment and plan- Patient is a 70 y.o. female withinvasive mammary carcinoma of the right breast pathological prognostic stage IApT1 pN0 cM0, grade 1 ER PR positive HER-2/neu negative status post lumpectomy and adjuvant radiation treatment.She is currently on Arimidex and this is a routine follow-up visit for breast cancer  Overall patient is doing well with no concerning signs and symptoms of recurrence based on today's exam.  Patient started Arimidex in early 2020 and will continue taking it at least until 2025.  She is tolerating it well overall without any significant side effects.I will see her back in 6 months with no labs.  We will plan to get a  bone density scan this year along with her mammogram   Visit Diagnosis 1. Visit for monitoring Arimidex therapy   2. Encounter for follow-up surveillance of breast cancer      Dr. Randa Evens, MD, MPH [facility] [phone] 12/27/2020 1:41 PM

## 2021-01-04 NOTE — Addendum Note (Signed)
Addended by: Kern Alberta on: 01/04/2021 01:50 PM   Modules accepted: Orders

## 2021-01-07 ENCOUNTER — Telehealth: Payer: Self-pay | Admitting: *Deleted

## 2021-01-07 NOTE — Telephone Encounter (Signed)
Called patient and left a message to see whether or not she wants to go back to [place] and have her bone density test done there or if she would like to change and go have her bone density test here at the [facility].  The reason of asking is because you cannot compare 1 bone density machine with another.  They basically cannot locate one and compare it from another one because they all have their own parameters.  It is fine to do it either way just wanted make sure were doing the right thing for the patient of which she wants.  Asked her to give [place] a call back [phone]

## 2021-01-12 ENCOUNTER — Encounter: Payer: Self-pay | Admitting: *Deleted

## 2021-01-12 ENCOUNTER — Telehealth: Payer: Self-pay | Admitting: *Deleted

## 2021-01-12 NOTE — Telephone Encounter (Signed)
Pt got my message about bone densityand when she called me back she said that she would we glad to get mammogram and bone density at the same time. She was not sure the date of the mammogram. I told her that I would check into it and let her know

## 2021-03-03 ENCOUNTER — Other Ambulatory Visit: Payer: Self-pay

## 2021-03-03 ENCOUNTER — Ambulatory Visit
Admission: RE | Admit: 2021-03-03 | Discharge: 2021-03-03 | Disposition: A | Discharge status: Home / Self Care | Payer: PPO | Source: Ambulatory Visit | Attending: Radiation Oncology | Admitting: Radiation Oncology

## 2021-03-03 ENCOUNTER — Encounter: Payer: Self-pay | Admitting: Radiation Oncology

## 2021-03-03 VITALS — BP 139/73 | HR 84 | Temp 97.1°F | Wt 200.4 lb

## 2021-03-03 DIAGNOSIS — Z79811 Long term (current) use of aromatase inhibitors: Secondary | ICD-10-CM | POA: Diagnosis not present

## 2021-03-03 DIAGNOSIS — C50211 Malignant neoplasm of upper-inner quadrant of right female breast: Secondary | ICD-10-CM | POA: Diagnosis not present

## 2021-03-03 DIAGNOSIS — Z08 Encounter for follow-up examination after completed treatment for malignant neoplasm: Secondary | ICD-10-CM | POA: Diagnosis not present

## 2021-03-03 DIAGNOSIS — Z923 Personal history of irradiation: Secondary | ICD-10-CM | POA: Insufficient documentation

## 2021-03-03 DIAGNOSIS — Z170 Estrogen receptor positive status [ER+]: Secondary | ICD-10-CM | POA: Diagnosis not present

## 2021-03-03 NOTE — Progress Notes (Signed)
Radiation Oncology Follow up Note  Name: Bridget Cummings   Date:   03/03/2021 MRN:  [id number] DOB: 02/23/1951    This 70 y.o. female presents to the clinic today for 1 month follow-up status post 2-year follow-up status post whole breast radiation to her right breast for stage I ER/PR positive invasive mammary carcinoma.  REFERRING PROVIDER: Jinny Sanders, MD  HPI: Patient is a 70 year old female now at 2 years having completed whole breast radiation to her right breast for stage I ER/PR positive invasive mammary carcinoma.  Seen today in routine follow-up she is doing well.  She is seen today in routine follow-up she specifically denies breast tenderness cough or bone pain..  She is currently on Arimidex tolerating it well without side effect.  She had mammograms last November which I have reviewed were BI-RADS 2 benign.  COMPLICATIONS OF TREATMENT: none  FOLLOW UP COMPLIANCE: keeps appointments   PHYSICAL EXAM:  BP 139/73   Pulse 84   Temp (!) 97.1 F (36.2 C) (Tympanic)   Wt 200 lb 6.4 oz (90.9 kg)   BMI 33.35 kg/m  Lungs are clear to A&P cardiac examination essentially unremarkable with regular rate and rhythm. No dominant mass or nodularity is noted in either breast in 2 positions examined. Incision is well-healed. No axillary or supraclavicular adenopathy is appreciated. Cosmetic result is excellent.  Well-developed well-nourished patient in NAD. HEENT reveals PERLA, EOMI, discs not visualized.  Oral cavity is clear. No oral mucosal lesions are identified. Neck is clear without evidence of cervical or supraclavicular adenopathy. Lungs are clear to A&P. Cardiac examination is essentially unremarkable with regular rate and rhythm without murmur rub or thrill. Abdomen is benign with no organomegaly or masses noted. Motor sensory and DTR levels are equal and symmetric in the upper and lower extremities. Cranial nerves II through XII are grossly intact. Proprioception is intact. No  peripheral adenopathy or edema is identified. No motor or sensory levels are noted. Crude visual fields are within normal range.  RADIOLOGY RESULTS: Mammograms reviewed compatible with above-stated findings  PLAN: Present time patient is doing well with no evidence of disease now out 2 years.  I am pleased with her overall progress.  I have asked to see her back in 1 year for follow-up.  Patient knows to call with any concerns.  She continues on Arimidex without side effect.  I would like to take this opportunity to thank you for allowing me to participate in the care of your patient.Noreene Filbert, MD

## 2021-05-17 ENCOUNTER — Ambulatory Visit: Payer: PPO

## 2021-05-17 ENCOUNTER — Telehealth: Payer: Self-pay

## 2021-05-17 ENCOUNTER — Ambulatory Visit (INDEPENDENT_AMBULATORY_CARE_PROVIDER_SITE_OTHER): Payer: PPO

## 2021-05-17 ENCOUNTER — Other Ambulatory Visit: Payer: Self-pay

## 2021-05-17 DIAGNOSIS — Z0000 Encounter for general adult medical examination without abnormal findings: Secondary | ICD-10-CM

## 2021-05-17 NOTE — Progress Notes (Signed)
Subjective:   Bridget Cummings is a 70 y.o. female who presents for Medicare Annual (Subsequent) preventive examination.  Review of Systems: N/A     I connected with the patient today by telephone and verified that I am speaking with the correct person using two identifiers. Location patient: home Location nurse: work Persons participating in the telephone visit: patient, nurse.   I discussed the limitations, risks, security and privacy concerns of performing an evaluation and management service by telephone and the availability of in person appointments. I also discussed with the patient that there may be a patient responsible charge related to this service. The patient expressed understanding and verbally consented to this telephonic visit.         Cardiac Risk Factors include: advanced age (>6men, >74 women);hypertension     Objective:    Today's Vitals   There is no height or weight on file to calculate BMI.  Advanced Directives 05/17/2021 03/03/2020 12/25/2019 08/25/2019 05/29/2019 02/25/2019 02/12/2019  Does Patient Have a Medical Advance Directive? No No No No No No No  Would patient like information on creating a medical advance directive? No - Patient declined No - Patient declined No - Patient declined No - Patient declined No - Patient declined No - Patient declined No - Patient declined    Current Medications (verified) Outpatient Encounter Medications as of 05/17/2021  Medication Sig   anastrozole (ARIMIDEX) 1 MG tablet TAKE 1 TABLET(1 MG) BY MOUTH DAILY   aspirin 81 MG tablet Take 81 mg by mouth at bedtime.   Calcium Carb-Cholecalciferol (CALCIUM + VITAMIN D3 PO) Take 2 tablets by mouth daily.   hydrochlorothiazide (HYDRODIURIL) 25 MG tablet Take 1 tablet (25 mg total) by mouth daily.   Omega-3 Fatty Acids (FISH OIL) 1000 MG CAPS Take 1 capsule by mouth 1 day or 1 dose.   polyethylene glycol (MIRALAX / GLYCOLAX) packet Take 17 g by mouth 3 (three) times a week.     pravastatin (PRAVACHOL) 20 MG tablet TAKE 1 TABLET(20 MG) BY MOUTH DAILY   vitamin B-12 (CYANOCOBALAMIN) 1000 MCG tablet Take 1,000 mcg by mouth daily.   No facility-administered encounter medications on file as of 05/17/2021.    Allergies (verified) Peppermint flavor   History: Past Medical History:  Diagnosis Date   Allergy    Breast cancer ([place]) 2008   Left Breast Cancer   Breast cancer ([place]) 2019   Right Breast Cancer   Family history of breast cancer    Hyperlipidemia    Personal history of radiation therapy 2008   Left Breast Cancer   Personal history of radiation therapy 2019   Right Breast Cancer   Past Surgical History:  Procedure Laterality Date   BREAST BIOPSY     BREAST LUMPECTOMY Left 2008   BREAST LUMPECTOMY Right 11/04/2018   BREAST LUMPECTOMY WITH RADIOACTIVE SEED AND SENTINEL LYMPH NODE BIOPSY Right 11/04/2018   Procedure: RIGHT BREAST LUMPECTOMY WITH RADIOACTIVE SEED AND RIGHT AXILLARY SENTINEL LYMPH NODE BIOPSY;  Surgeon: Fanny Skates, MD;  Location: [facility];  Service: General;  Laterality: Right;   FOOT SURGERY     x5 total   OVARY REMOVED  2009   TONSILLECTOMY     Family History  Problem Relation Age of Onset   Breast cancer Mother 86   Diabetes Father    Breast cancer Sister 40   Breast cancer Maternal Aunt 72   Heart attack Maternal Grandmother 75   Breast cancer Cousin  are dx unk   Social History   Socioeconomic History   Marital status: Married    Spouse name: Not on file   Number of children: 2   Years of education: Not on file   Highest education level: Not on file  Occupational History   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    Employer: [employer]  Tobacco Use   Smoking status: Never   Smokeless tobacco: Never  Vaping Use   Vaping Use: Never used  Substance and Sexual Activity   Alcohol use: No   Drug use: No   Sexual activity: Yes  Other Topics Concern   Not on file  Social History Narrative   Regular exercise-yes,  walks 45 minutes every day   Diet: fruit and veggies and chicken avoiding salt   Social Determinants of Health   Financial Resource Strain: Low Risk    Difficulty of Paying Living Expenses: Not hard at all  Food Insecurity: No Food Insecurity   Worried About Charity fundraiser in the Last Year: Never true   [place] in the Last Year: Never true  Transportation Needs: No Transportation Needs   Lack of Transportation (Medical): No   Lack of Transportation (Non-Medical): No  Physical Activity: Insufficiently Active   Days of Exercise per Week: 7 days   Minutes of Exercise per Session: 20 min  Stress: No Stress Concern Present   Feeling of Stress : Not at all  Social Connections: Not on file    Tobacco Counseling Counseling given: Not Answered   Clinical Intake:  Pre-visit preparation completed: Yes  Pain : No/denies pain     Nutritional Risks: None Diabetes: No  How often do you need to have someone help you when you read instructions, pamphlets, or other written materials from your doctor or pharmacy?: 1 - Never  Diabetic: No Nutrition Risk Assessment:  Has the patient had any N/V/D within the last 2 months?  No  Does the patient have any non-healing wounds?  No  Has the patient had any unintentional weight loss or weight gain?  No   Diabetes:  Is the patient diabetic?  No  If diabetic, was a CBG obtained today?   N/A Did the patient bring in their glucometer from home?   N/A How often do you monitor your CBG's? N/A.   Financial Strains and Diabetes Management:  Are you having any financial strains with the device, your supplies or your medication?  N/A .  Does the patient want to be seen by Chronic Care Management for management of their diabetes?   N/A Would the patient like to be referred to a Nutritionist or for Diabetic Management?   N/A   Interpreter Needed?: No  Information entered by :: CJohnson, LPN   Activities of Daily Living In your  present state of health, do you have any difficulty performing the following activities: 05/17/2021  Hearing? N  Vision? N  Difficulty concentrating or making decisions? N  Walking or climbing stairs? N  Dressing or bathing? N  Doing errands, shopping? N  Preparing Food and eating ? N  Using the Toilet? N  In the past six months, have you accidently leaked urine? N  Do you have problems with loss of bowel control? N  Managing your Medications? N  Managing your Finances? N  Housekeeping or managing your Housekeeping? N  Some recent data might be hidden    Patient Care Team: Jinny Sanders, MD as PCP - General  Magrinat, Virgie Dad, MD as Consulting Physician (Oncology) Fanny Skates, MD as Consulting Physician (General Surgery) Noreene Filbert, MD as Referring Physician (Radiation Oncology) Lloyd Huger, MD as Consulting Physician (Oncology)  Indicate any recent Medical Services you may have received from other than [facility] providers in the past year (date may be approximate).     Assessment:   This is a routine wellness examination for Srija.  Hearing/Vision screen Vision Screening - Comments:: Patient gets annual eye exams  Dietary issues and exercise activities discussed: Current Exercise Habits: Home exercise routine, Type of exercise: walking, Time (Minutes): 20, Frequency (Times/Week): 7, Weekly Exercise (Minutes/Week): 140, Intensity: Moderate, Exercise limited by: None identified   Goals Addressed             This Visit's Progress    Patient Stated       05/17/2021, I will continue to walk daily for about 20 minutes.         Depression Screen PHQ 2/9 Scores 05/17/2021 05/21/2020 05/16/2019 02/22/2018  PHQ - 2 Score 0 0 0 0  PHQ- 9 Score 0 - - -    Fall Risk Fall Risk  05/17/2021 05/21/2020 05/16/2019 02/22/2018  Falls in the past year? 0 0 0 No  Number falls in past yr: 0 - - -  Injury with Fall? 0 - - -  Risk for fall due to : Medication side effect -  - -  Follow up Falls evaluation completed;Falls prevention discussed - - -    FALL RISK PREVENTION PERTAINING TO THE HOME:  Any stairs in or around the home?  Yes If so, are there any without handrails? No  Home free of loose throw rugs in walkways, pet beds, electrical cords, etc? Yes  Adequate lighting in your home to reduce risk of falls? Yes   ASSISTIVE DEVICES UTILIZED TO PREVENT FALLS:  Life alert? No  Use of a cane, walker or w/c? No  Grab bars in the bathroom? No  Shower chair or bench in shower? No  Elevated toilet seat or a handicapped toilet? No   TIMED UP AND GO:  Was the test performed?  N/A telephone visit .   Cognitive Function: MMSE - Mini Mental State Exam 05/17/2021  Orientation to time 5  Orientation to Place 5  Registration 3  Attention/ Calculation 5  Recall 3  Language- repeat 1  Mini Cog  Mini-Cog screen was completed. Maximum score is 22. A value of 0 denotes this part of the MMSE was not completed or the patient failed this part of the Mini-Cog screening.       Immunizations Immunization History  Administered Date(s) Administered   Fluad Quad(high Dose 65+) 08/23/2019   Influenza Split 09/21/2011, 09/26/2012   Influenza Whole 12/04/2005, 12/20/2007, 08/24/2008, 09/21/2009, 09/13/2010   Influenza, High Dose Seasonal PF 08/28/2017   Influenza, Seasonal, Injecte, Preservative Fre 08/31/2014, 09/04/2015, 09/05/2016   Influenza,inj,Quad PF,6+ Mos 09/30/2013   Influenza,trivalent, recombinat, inj, PF 07/30/2018   Moderna Sars-Covid-2 Vaccination 01/07/2020, 02/04/2020   Pneumococcal Conjugate-13 06/21/2017   Pneumococcal Polysaccharide-23 06/26/2018   Td 12/05/1999, 09/13/2010   Zoster, Live 09/26/2012    TDAP status: Due, Education has been provided regarding the importance of this vaccine. Advised may receive this vaccine at local pharmacy or Health Dept. Aware to provide a copy of the vaccination record if obtained from local pharmacy or  Health Dept. Verbalized acceptance and understanding.  Flu Vaccine status: due Fall 2022  Pneumococcal vaccine status: Up to date  Covid-19  vaccine status: Completed 3 vaccines, booster due, will discuss with provider   Qualifies for Shingles Vaccine? Yes   Zostavax completed Yes   Shingrix Completed?: No.    Education has been provided regarding the importance of this vaccine. Patient has been advised to call insurance company to determine out of pocket expense if they have not yet received this vaccine. Advised may also receive vaccine at local pharmacy or Health Dept. Verbalized acceptance and understanding.  Screening Tests Health Maintenance  Topic Date Due   Zoster Vaccines- Shingrix (1 of 2) Never done   COVID-19 Vaccine (3 - Moderna risk series) 03/03/2020   TETANUS/TDAP  09/13/2020   INFLUENZA VACCINE  07/04/2021   MAMMOGRAM  10/11/2022   COLONOSCOPY (Pts 45-17yrs Insurance coverage will need to be confirmed)  12/02/2029   DEXA SCAN  Completed   Hepatitis C Screening  Completed   PNA vac Low Risk Adult  Completed   HPV VACCINES  Aged Out    Health Maintenance  Health Maintenance Due  Topic Date Due   Zoster Vaccines- Shingrix (1 of 2) Never done   COVID-19 Vaccine (3 - Moderna risk series) 03/03/2020   TETANUS/TDAP  09/13/2020    Colorectal cancer screening: Type of screening: Colonoscopy. Completed 12/03/2019. Repeat every 10 years  Mammogram status: Completed 12/12/2019. Repeat every year  Bone Density status: due, will discuss with provider  Lung Cancer Screening: (Low Dose CT Chest recommended if Age 13-80 years, 30 pack-year currently smoking OR have quit w/in 15years.) does not qualify.   Additional Screening:  Hepatitis C Screening: does qualify; Completed 10/14/2012  Vision Screening: Recommended annual ophthalmology exams for early detection of glaucoma and other disorders of the eye. Is the patient up to date with their annual eye exam?  Yes  Who is  the provider or what is the name of the office in which the patient attends annual eye exams? Dr. Marvel Plan If pt is not established with a provider, would they like to be referred to a provider to establish care? No .   Dental Screening: Recommended annual dental exams for proper oral hygiene  Community Resource Referral / Chronic Care Management: CRR required this visit?  No   CCM required this visit?  No      Plan:     I have personally reviewed and noted the following in the patient's chart:   Medical and social history Use of alcohol, tobacco or illicit drugs  Current medications and supplements including opioid prescriptions.  Functional ability and status Nutritional status Physical activity Advanced directives List of other physicians Hospitalizations, surgeries, and ER visits in previous 12 months Vitals Screenings to include cognitive, depression, and falls Referrals and appointments  In addition, I have reviewed and discussed with patient certain preventive protocols, quality metrics, and best practice recommendations. A written personalized care plan for preventive services as well as general preventive health recommendations were provided to patient.   Due to this being a telephonic visit, the after visit summary with patients personalized plan was offered to patient via office or my-chart.  Patient preferred to pick up at office at next visit or via [place], [place]   3/00/9233

## 2021-05-17 NOTE — Patient Instructions (Signed)
Bridget Cummings , Thank you for taking time to come for your Medicare Wellness Visit. I appreciate your ongoing commitment to your health goals. Please review the following plan we discussed and let me know if I can assist you in the future.   Screening recommendations/referrals: Colonoscopy: Up to date, completed 12/03/2019, due 11/2029 Mammogram: Up to date, completed 10/11/2020, due 10/2021 Bone Density: due, will discuss with provider Recommended yearly ophthalmology/optometry visit for glaucoma screening and checkup Recommended yearly dental visit for hygiene and checkup  Vaccinations: Influenza vaccine: due Fall 2022 Pneumococcal vaccine: Completed series Tdap vaccine: insurance Shingles vaccine: due, check with your insurance regarding coverage if interested    Covid-19:Completed 3 vaccines, booster due, will discuss with provider   Advanced directives: Advance directive discussed with you today. Even though you declined this today please call our office should you change your mind and we can give you the proper paperwork for you to fill out.  Conditions/risks identified: hypertension  Next appointment: Follow up in one year for your annual wellness visit    Preventive Care 69 Years and Older, Female Preventive care refers to lifestyle choices and visits with your health care provider that can promote health and wellness. What does preventive care include? A yearly physical exam. This is also called an annual well check. Dental exams once or twice a year. Routine eye exams. Ask your health care provider how often you should have your eyes checked. Personal lifestyle choices, including: Daily care of your teeth and gums. Regular physical activity. Eating a healthy diet. Avoiding tobacco and drug use. Limiting alcohol use. Practicing safe sex. Taking low-dose aspirin every day. Taking vitamin and mineral supplements as recommended by your health care provider. What happens  during an annual well check? The services and screenings done by your health care provider during your annual well check will depend on your age, overall health, lifestyle risk factors, and family history of disease. Counseling  Your health care provider may ask you questions about your: Alcohol use. Tobacco use. Drug use. Emotional well-being. Home and relationship well-being. Sexual activity. Eating habits. History of falls. Memory and ability to understand (cognition). Work and work Statistician. Reproductive health. Screening  You may have the following tests or measurements: Height, weight, and BMI. Blood pressure. Lipid and cholesterol levels. These may be checked every 5 years, or more frequently if you are over 69 years old. Skin check. Lung cancer screening. You may have this screening every year starting at age 69 if you have a 30-pack-year history of smoking and currently smoke or have quit within the past 15 years. Fecal occult blood test (FOBT) of the stool. You may have this test every year starting at age 69 Flexible sigmoidoscopy or colonoscopy. You may have a sigmoidoscopy every 5 years or a colonoscopy every 10 years starting at age 69 Hepatitis C blood test. Hepatitis B blood test. Sexually transmitted disease (STD) testing. Diabetes screening. This is done by checking your blood sugar (glucose) after you have not eaten for a while (fasting). You may have this done every 1-3 years. Bone density scan. This is done to screen for osteoporosis. You may have this done starting at age 69 Mammogram. This may be done every 1-2 years. Talk to your health care provider about how often you should have regular mammograms. Talk with your health care provider about your test results, treatment options, and if necessary, the need for more tests. Vaccines  Your health care provider may recommend certain vaccines,  such as: Influenza vaccine. This is recommended every  year. Tetanus, diphtheria, and acellular pertussis (Tdap, Td) vaccine. You may need a Td booster every 10 years. Zoster vaccine. You may need this after age 69 Pneumococcal 13-valent conjugate (PCV13) vaccine. One dose is recommended after age 69 Pneumococcal polysaccharide (PPSV23) vaccine. One dose is recommended after age 70. Talk to your health care provider about which screenings and vaccines you need and how often you need them. This information is not intended to replace advice given to you by your health care provider. Make sure you discuss any questions you have with your health care provider. Document Released: 12/17/2015 Document Revised: 08/09/2016 Document Reviewed: 09/21/2015 Elsevier Interactive Patient Education  2017 [employer] Prevention in the Home Falls can cause injuries. They can happen to people of all ages. There are many things you can do to make your home safe and to help prevent falls. What can I do on the outside of my home? Regularly fix the edges of walkways and driveways and fix any cracks. Remove anything that might make you trip as you walk through a door, such as a raised step or threshold. Trim any bushes or trees on the path to your home. Use bright outdoor lighting. Clear any walking paths of anything that might make someone trip, such as rocks or tools. Regularly check to see if handrails are loose or broken. Make sure that both sides of any steps have handrails. Any raised decks and porches should have guardrails on the edges. Have any leaves, snow, or ice cleared regularly. Use sand or salt on walking paths during winter. Clean up any spills in your garage right away. This includes oil or grease spills. What can I do in the bathroom? Use night lights. Install grab bars by the toilet and in the tub and shower. Do not use towel bars as grab bars. Use non-skid mats or decals in the tub or shower. If you need to sit down in the shower, use a  plastic, non-slip stool. Keep the floor dry. Clean up any water that spills on the floor as soon as it happens. Remove soap buildup in the tub or shower regularly. Attach bath mats securely with double-sided non-slip rug tape. Do not have throw rugs and other things on the floor that can make you trip. What can I do in the bedroom? Use night lights. Make sure that you have a light by your bed that is easy to reach. Do not use any sheets or blankets that are too big for your bed. They should not hang down onto the floor. Have a firm chair that has side arms. You can use this for support while you get dressed. Do not have throw rugs and other things on the floor that can make you trip. What can I do in the kitchen? Clean up any spills right away. Avoid walking on wet floors. Keep items that you use a lot in easy-to-reach places. If you need to reach something above you, use a strong step stool that has a grab bar. Keep electrical cords out of the way. Do not use floor polish or wax that makes floors slippery. If you must use wax, use non-skid floor wax. Do not have throw rugs and other things on the floor that can make you trip. What can I do with my stairs? Do not leave any items on the stairs. Make sure that there are handrails on both sides of the stairs and  use them. Fix handrails that are broken or loose. Make sure that handrails are as long as the stairways. Check any carpeting to make sure that it is firmly attached to the stairs. Fix any carpet that is loose or worn. Avoid having throw rugs at the top or bottom of the stairs. If you do have throw rugs, attach them to the floor with carpet tape. Make sure that you have a light switch at the top of the stairs and the bottom of the stairs. If you do not have them, ask someone to add them for you. What else can I do to help prevent falls? Wear shoes that: Do not have high heels. Have rubber bottoms. Are comfortable and fit you  well. Are closed at the toe. Do not wear sandals. If you use a stepladder: Make sure that it is fully opened. Do not climb a closed stepladder. Make sure that both sides of the stepladder are locked into place. Ask someone to hold it for you, if possible. Clearly mark and make sure that you can see: Any grab bars or handrails. First and last steps. Where the edge of each step is. Use tools that help you move around (mobility aids) if they are needed. These include: Canes. Walkers. Scooters. Crutches. Turn on the lights when you go into a dark area. Replace any light bulbs as soon as they burn out. Set up your furniture so you have a clear path. Avoid moving your furniture around. If any of your floors are uneven, fix them. If there are any pets around you, be aware of where they are. Review your medicines with your doctor. Some medicines can make you feel dizzy. This can increase your chance of falling. Ask your doctor what other things that you can do to help prevent falls. This information is not intended to replace advice given to you by your health care provider. Make sure you discuss any questions you have with your health care provider. Document Released: 09/16/2009 Document Revised: 04/27/2016 Document Reviewed: 12/25/2014 Elsevier Interactive Patient Education  2017 [employer].

## 2021-05-17 NOTE — Telephone Encounter (Signed)
Called patient 4 times trying to complete her AWV. Patient never answered. Left message on voicemail.

## 2021-05-17 NOTE — Progress Notes (Addendum)
PCP notes:  Health Maintenance: Covid booster- second one due Shingrix- due Dexa- due    Abnormal Screenings: none   Patient concerns: none   Nurse concerns: none   Next PCP appt.: 05/24/2021 @ 9:40 am

## 2021-05-24 ENCOUNTER — Encounter: Payer: PPO | Admitting: Family Medicine

## 2021-06-07 ENCOUNTER — Encounter: Payer: Self-pay | Admitting: Family Medicine

## 2021-06-07 ENCOUNTER — Ambulatory Visit (INDEPENDENT_AMBULATORY_CARE_PROVIDER_SITE_OTHER): Payer: PPO | Admitting: Family Medicine

## 2021-06-07 ENCOUNTER — Other Ambulatory Visit: Payer: Self-pay

## 2021-06-07 VITALS — BP 131/83 | HR 93 | Temp 98.3°F | Ht 65.0 in | Wt 195.8 lb

## 2021-06-07 DIAGNOSIS — I10 Essential (primary) hypertension: Secondary | ICD-10-CM

## 2021-06-07 DIAGNOSIS — Z853 Personal history of malignant neoplasm of breast: Secondary | ICD-10-CM | POA: Diagnosis not present

## 2021-06-07 DIAGNOSIS — Z0000 Encounter for general adult medical examination without abnormal findings: Secondary | ICD-10-CM | POA: Diagnosis not present

## 2021-06-07 DIAGNOSIS — E785 Hyperlipidemia, unspecified: Secondary | ICD-10-CM | POA: Diagnosis not present

## 2021-06-07 NOTE — Patient Instructions (Addendum)
Keep working on healthy eating and regular exercise! 

## 2021-06-07 NOTE — Assessment & Plan Note (Signed)
Due for re-eval on pravastatin 20 mg daily

## 2021-06-07 NOTE — Assessment & Plan Note (Signed)
S?P breast cancer recurrence.. now on 5 years of tamoxifen.

## 2021-06-07 NOTE — Progress Notes (Signed)
Patient ID: Bridget Cummings, female    DOB: Sep 01, 1951, 70 y.o.   MRN: [id number]  This visit was conducted in person.  BP 140/90   Pulse 93   Temp 98.3 F (36.8 C) (Temporal)   Ht '5\' 5"'  (1.651 m)   Wt 195 lb 12 oz (88.8 kg)   SpO2 96%   BMI 32.57 kg/m    CC: Chief Complaint  Patient presents with   Annual Exam    Part 2    Subjective:   HPI: Bridget Cummings is a 70 y.o. female presenting on 06/07/2021 for Annual Exam (Part 2) The patient presents for annual medicare wellness, complete physical and review of chronic health problems. He/She also has the following acute concerns today:  The patient saw a LPN or RN for medicare wellness visit.  Prevention and wellness was reviewed in detail. Note reviewed and important notes copied below.  Hx of breast cancer on arimidex She has had recurrent breast cancer in right breast: Lumpectomy on 11/04/2018 showed invasive and in situ ductal carcinoma 0.8 cm.  Negative margins.  3 sentinel lymph nodes were negative for malignancy.  Overall grade 1.  ER 100% positive PR 100% positive HER-2/neu negative and Ki-67 2%.  Patient completed adjuvant radiation treatment and was started on Arimidex in February 2020  Hot flashes from arimidex.  Hypertension:  Borderline  on HCTZ daily.. has white coat HTN element  BP Readings from Last 3 Encounters:  06/07/21 140/90  03/03/21 139/73  12/27/20 (!) 147/71  Using medication without problems or lightheadedness:  none Chest pain with exertion: none Edema:none Short of breath: none Average home BPs: Other issues:  Elevated Cholesterol:  Due for re-eval on pravastatin 20 mg daily. Lab Results  Component Value Date   CHOL 246 (H) 06/22/2020   HDL 55 06/22/2020   LDLCALC 154 (H) 06/22/2020   LDLDIRECT 140.2 09/30/2013   TRIG 183 (H) 06/22/2020   CHOLHDL 4.5 06/22/2020  Using medications without problems: Muscle aches:  Diet compliance: moderate Exercise: daily, swimming  aerobics Other complaints:      Relevant past medical, surgical, family and social history reviewed and updated as indicated. Interim medical history since our last visit reviewed. Allergies and medications reviewed and updated. Outpatient Medications Prior to Visit  Medication Sig Dispense Refill   anastrozole (ARIMIDEX) 1 MG tablet TAKE 1 TABLET(1 MG) BY MOUTH DAILY 90 tablet 3   aspirin 81 MG tablet Take 81 mg by mouth at bedtime.     Calcium Carb-Cholecalciferol (CALCIUM + VITAMIN D3 PO) Take 2 tablets by mouth daily.     hydrochlorothiazide (HYDRODIURIL) 25 MG tablet Take 1 tablet (25 mg total) by mouth daily. 30 tablet 11   Omega-3 Fatty Acids (FISH OIL) 1000 MG CAPS Take 1 capsule by mouth 1 day or 1 dose.     polyethylene glycol (MIRALAX / GLYCOLAX) packet Take 17 g by mouth 3 (three) times a week.      pravastatin (PRAVACHOL) 20 MG tablet TAKE 1 TABLET(20 MG) BY MOUTH DAILY 90 tablet 3   vitamin B-12 (CYANOCOBALAMIN) 1000 MCG tablet Take 1,000 mcg by mouth daily.     No facility-administered medications prior to visit.     Per HPI unless specifically indicated in ROS section below Review of Systems  Constitutional:  Negative for fatigue and fever.  HENT:  Negative for ear pain.   Eyes:  Negative for pain.  Respiratory:  Negative for chest tightness and shortness of breath.  Cardiovascular:  Negative for chest pain, palpitations and leg swelling.  Gastrointestinal:  Negative for abdominal pain.  Genitourinary:  Negative for dysuria.  Objective:  BP 140/90   Pulse 93   Temp 98.3 F (36.8 C) (Temporal)   Ht '5\' 5"'  (1.651 m)   Wt 195 lb 12 oz (88.8 kg)   SpO2 96%   BMI 32.57 kg/m   Wt Readings from Last 3 Encounters:  06/07/21 195 lb 12 oz (88.8 kg)  03/03/21 200 lb 6.4 oz (90.9 kg)  07/16/20 202 lb 8 oz (91.9 kg)      Physical Exam Constitutional:      General: She is not in acute distress.    Appearance: Normal appearance. She is well-developed. She is not  ill-appearing or toxic-appearing.  HENT:     Head: Normocephalic.     Right Ear: Hearing, tympanic membrane, ear canal and external ear normal.     Left Ear: Hearing, tympanic membrane, ear canal and external ear normal.     Nose: Nose normal.  Eyes:     General: Lids are normal. Lids are everted, no foreign bodies appreciated.     Conjunctiva/sclera: Conjunctivae normal.     Pupils: Pupils are equal, round, and reactive to light.  Neck:     Thyroid: No thyroid mass or thyromegaly.     Vascular: No carotid bruit.     Trachea: Trachea normal.  Cardiovascular:     Rate and Rhythm: Normal rate and regular rhythm.     Heart sounds: Normal heart sounds, S1 normal and S2 normal. No murmur heard.   No gallop.  Pulmonary:     Effort: Pulmonary effort is normal. No respiratory distress.     Breath sounds: Normal breath sounds. No wheezing, rhonchi or rales.  Abdominal:     General: Bowel sounds are normal. There is no distension or abdominal bruit.     Palpations: Abdomen is soft. There is no fluid wave or mass.     Tenderness: There is no abdominal tenderness. There is no guarding or rebound.     Hernia: No hernia is present.  Musculoskeletal:     Cervical back: Normal range of motion and neck supple.  Lymphadenopathy:     Cervical: No cervical adenopathy.  Skin:    General: Skin is warm and dry.     Findings: No rash.  Neurological:     Mental Status: She is alert.     Cranial Nerves: No cranial nerve deficit.     Sensory: No sensory deficit.  Psychiatric:        Mood and Affect: Mood is not anxious or depressed.        Speech: Speech normal.        Behavior: Behavior normal. Behavior is cooperative.        Judgment: Judgment normal.      Results for orders placed or performed in visit on 06/22/20  Comprehensive metabolic panel  Result Value Ref Range   Sodium 139 135 - 145 mmol/L   Potassium 4.2 3.5 - 5.1 mmol/L   Chloride 104 98 - 111 mmol/L   CO2 26 22 - 32 mmol/L    Glucose, Bld 106 (H) 70 - 99 mg/dL   BUN 21 8 - 23 mg/dL   Creatinine, Ser 1.14 (H) 0.44 - 1.00 mg/dL   Calcium 9.5 8.9 - 10.3 mg/dL   Total Protein 7.4 6.5 - 8.1 g/dL   Albumin 4.1 3.5 - 5.0 g/dL   AST 29  15 - 41 U/L   ALT 36 0 - 44 U/L   Alkaline Phosphatase 100 38 - 126 U/L   Total Bilirubin 0.9 0.3 - 1.2 mg/dL   GFR calc non Af Amer 49 (L) >60 mL/min   GFR calc Af Amer 57 (L) >60 mL/min   Anion gap 9 5 - 15  Lipid panel  Result Value Ref Range   Cholesterol 246 (H) 0 - 200 mg/dL   Triglycerides 183 (H) <150 mg/dL   HDL 55 >40 mg/dL   Total CHOL/HDL Ratio 4.5 RATIO   VLDL 37 0 - 40 mg/dL   LDL Cholesterol 154 (H) 0 - 99 mg/dL    This visit occurred during the SARS-CoV-2 public health emergency.  Safety protocols were in place, including screening questions prior to the visit, additional usage of staff PPE, and extensive cleaning of exam room while observing appropriate contact time as indicated for disinfecting solutions.   COVID 19 screen:  No recent travel or known exposure to COVID19 The patient denies respiratory symptoms of COVID 19 at this time. The importance of social distancing was discussed today.   Assessment and Plan The patient's preventative maintenance and recommended screening tests for an annual wellness exam were reviewed in full today. Brought up to date unless services declined.  Counselled on the importance of diet, exercise, and its role in overall health and mortality. The patient's FH and SH was reviewed, including their home life, tobacco status, and drug and alcohol status.    Vaccines: uptodate. S/P  COVID vaccine, consider Shingrix screening mammogram...per onc,  hx of breast cancer neg 10/2020 PAP/DVE: no ovaries due to hx of breast cancer, has uterus. On q5 year Pap schedule, last done 2017.. Pap no longer indicated. No DVE indicated, asymptomatic. Colonoscopy 11/2019 Dr. Earlean Shawl, repeat in 10 years.  She has had 3 hemorrhoid banding. DEXA;  nml  2019.. plan repeat in 5 years.  Refused HIV testing.  Hep C: neg  Nonsmoker, ETOH wine rarely  Problem List Items Addressed This Visit     Essential hypertension    Borderline in office but has history of white coat HTN. At home good control.  On HCTZ 25 mg daily      Relevant Medications   pravastatin (PRAVACHOL) 40 MG tablet   Other Relevant Orders   Lipid panel (Completed)   Comprehensive metabolic panel (Completed)   CBC with Differential/Platelet (Completed)   History of breast cancer    S?P breast cancer recurrence.. now on 5 years of tamoxifen.      Hyperlipidemia LDL goal <130    Due for re-eval on pravastatin 20 mg daily      Relevant Medications   pravastatin (PRAVACHOL) 40 MG tablet   Other Relevant Orders   Lipid panel (Completed)   Comprehensive metabolic panel (Completed)   CBC with Differential/Platelet (Completed)   Other Visit Diagnoses     Routine general medical examination at a health care facility    -  Primary        Eliezer Lofts, MD

## 2021-06-07 NOTE — Assessment & Plan Note (Signed)
Borderline in office but has history of white coat HTN. At home good control.  On HCTZ 25 mg daily

## 2021-06-08 LAB — LIPID PANEL
Cholesterol: 217 mg/dL — ABNORMAL HIGH (ref 0–200)
HDL: 48.9 mg/dL (ref >39.00)
LDL Cholesterol: 135 mg/dL — ABNORMAL HIGH (ref 0–99)
NonHDL: 168.09
Total CHOL/HDL Ratio: 4
Triglycerides: 164 mg/dL — ABNORMAL HIGH (ref 0.0–149.0)
VLDL: 32.8 mg/dL (ref 0.0–40.0)

## 2021-06-08 LAB — CBC WITH DIFFERENTIAL/PLATELET
Basophils Absolute: 0.1 10*3/uL (ref 0.0–0.1)
Basophils Relative: 1.1 % (ref 0.0–3.0)
Eosinophils Absolute: 0.4 10*3/uL (ref 0.0–0.7)
Eosinophils Relative: 4.3 % (ref 0.0–5.0)
HCT: 44.1 % (ref 36.0–46.0)
Hemoglobin: 15.4 g/dL — ABNORMAL HIGH (ref 12.0–15.0)
Lymphocytes Relative: 25 % (ref 12.0–46.0)
Lymphs Abs: 2.2 10*3/uL (ref 0.7–4.0)
MCHC: 35 g/dL (ref 30.0–36.0)
MCV: 89.7 fl (ref 78.0–100.0)
Monocytes Absolute: 0.7 10*3/uL (ref 0.1–1.0)
Monocytes Relative: 8.1 % (ref 3.0–12.0)
Neutro Abs: 5.4 10*3/uL (ref 1.4–7.7)
Neutrophils Relative %: 61.5 % (ref 43.0–77.0)
Platelets: 262 10*3/uL (ref 150.0–400.0)
RBC: 4.92 Mil/uL (ref 3.87–5.11)
RDW: 13.1 % (ref 11.5–15.5)
WBC: 8.8 10*3/uL (ref 4.0–10.5)

## 2021-06-08 LAB — COMPREHENSIVE METABOLIC PANEL
ALT: 46 U/L — ABNORMAL HIGH (ref 0–35)
AST: 32 U/L (ref 0–37)
Albumin: 4.4 g/dL (ref 3.5–5.2)
Alkaline Phosphatase: 80 U/L (ref 39–117)
BUN: 20 mg/dL (ref 6–23)
CO2: 29 mEq/L (ref 19–32)
Calcium: 9.6 mg/dL (ref 8.4–10.5)
Chloride: 98 mEq/L (ref 96–112)
Creatinine, Ser: 1.05 mg/dL (ref 0.40–1.20)
GFR: 54.42 mL/min — ABNORMAL LOW (ref >60.00)
Glucose, Bld: 110 mg/dL — ABNORMAL HIGH (ref 70–99)
Potassium: 3.4 mEq/L — ABNORMAL LOW (ref 3.5–5.1)
Sodium: 139 mEq/L (ref 135–145)
Total Bilirubin: 1 mg/dL (ref 0.2–1.2)
Total Protein: 7.4 g/dL (ref 6.0–8.3)

## 2021-06-09 MED ORDER — PRAVASTATIN SODIUM 40 MG PO TABS
40.0000 mg | ORAL_TABLET | Freq: Every day | ORAL | 11 refills | Status: DC
Start: 2021-06-09 — End: 2022-05-29

## 2021-06-09 NOTE — Progress Notes (Signed)
Rx sent in.. make sure she has fasting labs for cholesterol and  CMET in 3 months scheduled.

## 2021-06-16 ENCOUNTER — Other Ambulatory Visit: Payer: Self-pay | Admitting: Family Medicine

## 2021-06-27 ENCOUNTER — Inpatient Hospital Stay: Payer: PPO | Admitting: Oncology

## 2021-07-18 ENCOUNTER — Inpatient Hospital Stay: Payer: PPO | Attending: Oncology | Admitting: Oncology

## 2021-07-18 ENCOUNTER — Other Ambulatory Visit: Payer: Self-pay

## 2021-07-18 ENCOUNTER — Encounter: Payer: Self-pay | Admitting: Oncology

## 2021-07-18 VITALS — BP 156/73 | HR 92 | Temp 99.3°F | Resp 20 | Wt 200.0 lb

## 2021-07-18 DIAGNOSIS — Z170 Estrogen receptor positive status [ER+]: Secondary | ICD-10-CM | POA: Insufficient documentation

## 2021-07-18 DIAGNOSIS — Z79811 Long term (current) use of aromatase inhibitors: Secondary | ICD-10-CM | POA: Insufficient documentation

## 2021-07-18 DIAGNOSIS — Z5181 Encounter for therapeutic drug level monitoring: Secondary | ICD-10-CM | POA: Diagnosis not present

## 2021-07-18 DIAGNOSIS — Z90721 Acquired absence of ovaries, unilateral: Secondary | ICD-10-CM | POA: Diagnosis not present

## 2021-07-18 DIAGNOSIS — Z79899 Other long term (current) drug therapy: Secondary | ICD-10-CM | POA: Insufficient documentation

## 2021-07-18 DIAGNOSIS — Z853 Personal history of malignant neoplasm of breast: Secondary | ICD-10-CM | POA: Diagnosis not present

## 2021-07-18 DIAGNOSIS — Z08 Encounter for follow-up examination after completed treatment for malignant neoplasm: Secondary | ICD-10-CM | POA: Diagnosis not present

## 2021-07-18 DIAGNOSIS — C50911 Malignant neoplasm of unspecified site of right female breast: Secondary | ICD-10-CM | POA: Insufficient documentation

## 2021-07-18 DIAGNOSIS — Z7982 Long term (current) use of aspirin: Secondary | ICD-10-CM | POA: Insufficient documentation

## 2021-07-18 NOTE — Progress Notes (Signed)
Hematology/Oncology Consult note [facility]  [phone] [phone]  Patient Care Team: Jinny Sanders, MD as PCP - General Magrinat, Virgie Dad, MD as Consulting Physician (Oncology) Fanny Skates, MD as Consulting Physician (General Surgery) Noreene Filbert, MD as Referring Physician (Radiation Oncology) Lloyd Huger, MD as Consulting Physician (Oncology)   Name of the patient: Bridget Cummings  [id number]  [birth year]   Date of visit: 07/18/21  Diagnosis-  invasive mammary carcinoma of the right breast pathological prognostic stage IA pT1 ppN0 cM0, grade 1 ER PR positive HER-2/neu negative status post lumpectomy and adjuvant radiation treatment  Chief complaint/ Reason for visit-routine follow-up of breast cancer on Arimidex  Heme/Onc history: patient is a 70 year old female who was diagnosed with left breast cancer in November 2008.  Pathology at that time showed a 1.7 cm DCIS that was ER positive.  She underwent external beam radiation and took tamoxifen for 4 years.  Patient found to have stage I invasive mammary carcinoma ER/PR positive HER-2/neu -8 mm grade 1 in the right breast s/p lumpectomy and adjuvant radiation treatment in 2019.  She did not require adjuvant chemotherapy.  She completed adjuvant radiation therapy and started on Arimidex in February 2020.  She is s/p bilateral oophorectomy in 2009 and has an intact uterus.    Interval history-patient is tolerating Arimidex well without any significant side effects.  ECOG PS- 1 Pain scale- 0   Review of systems- Review of Systems  Constitutional:  Negative for chills, fever, malaise/fatigue and weight loss.  HENT:  Negative for congestion, ear discharge and nosebleeds.   Eyes:  Negative for blurred vision.  Respiratory:  Negative for cough, hemoptysis, sputum production, shortness of breath and wheezing.   Cardiovascular:  Negative for chest pain, palpitations,  orthopnea and claudication.  Gastrointestinal:  Negative for abdominal pain, blood in stool, constipation, diarrhea, heartburn, melena, nausea and vomiting.  Genitourinary:  Negative for dysuria, flank pain, frequency, hematuria and urgency.  Musculoskeletal:  Negative for back pain, joint pain and myalgias.  Skin:  Negative for rash.  Neurological:  Negative for dizziness, tingling, focal weakness, seizures, weakness and headaches.  Endo/Heme/Allergies:  Does not bruise/bleed easily.  Psychiatric/Behavioral:  Negative for depression and suicidal ideas. The patient does not have insomnia.       Allergies  Allergen Reactions   Peppermint Flavor     sneezing     Past Medical History:  Diagnosis Date   Allergy    Breast cancer ([place]) 2008   Left Breast Cancer   Breast cancer ([place]) 2019   Right Breast Cancer   Family history of breast cancer    Hyperlipidemia    Personal history of radiation therapy 2008   Left Breast Cancer   Personal history of radiation therapy 2019   Right Breast Cancer     Past Surgical History:  Procedure Laterality Date   BREAST BIOPSY     BREAST LUMPECTOMY Left 2008   BREAST LUMPECTOMY Right 11/04/2018   BREAST LUMPECTOMY WITH RADIOACTIVE SEED AND SENTINEL LYMPH NODE BIOPSY Right 11/04/2018   Procedure: RIGHT BREAST LUMPECTOMY WITH RADIOACTIVE SEED AND RIGHT AXILLARY SENTINEL LYMPH NODE BIOPSY;  Surgeon: Fanny Skates, MD;  Location: [facility];  Service: General;  Laterality: Right;   FOOT SURGERY     x5 total   OVARY REMOVED  2009   TONSILLECTOMY      Social History   Socioeconomic History   Marital status: Married    Spouse name: Not  on file   Number of children: 2   Years of education: Not on file   Highest education level: Not on file  Occupational History   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    Employer: [employer]  Tobacco Use   Smoking status: Never   Smokeless tobacco: Never  Vaping Use   Vaping Use: Never used  Substance and  Sexual Activity   Alcohol use: No   Drug use: No   Sexual activity: Yes  Other Topics Concern   Not on file  Social History Narrative   Regular exercise-yes, walks 45 minutes every day   Diet: fruit and veggies and chicken avoiding salt   Social Determinants of Health   Financial Resource Strain: Low Risk    Difficulty of Paying Living Expenses: Not hard at all  Food Insecurity: No Food Insecurity   Worried About Charity fundraiser in the Last Year: Never true   Ran Out of Food in the Last Year: Never true  Transportation Needs: No Transportation Needs   Lack of Transportation (Medical): No   Lack of Transportation (Non-Medical): No  Physical Activity: Insufficiently Active   Days of Exercise per Week: 7 days   Minutes of Exercise per Session: 20 min  Stress: No Stress Concern Present   Feeling of Stress : Not at all  Social Connections: Not on file  Intimate Partner Violence: Not At Risk   Fear of Current or Ex-Partner: No   Emotionally Abused: No   Physically Abused: No   Sexually Abused: No    Family History  Problem Relation Age of Onset   Breast cancer Mother 67   Diabetes Father    Breast cancer Sister 31   Breast cancer Maternal Aunt 72   Heart attack Maternal Grandmother 75   Breast cancer Cousin        are dx unk     Current Outpatient Medications:    anastrozole (ARIMIDEX) 1 MG tablet, TAKE 1 TABLET(1 MG) BY MOUTH DAILY, Disp: 90 tablet, Rfl: 3   aspirin 81 MG tablet, Take 81 mg by mouth at bedtime., Disp: , Rfl:    Calcium Carb-Cholecalciferol (CALCIUM + VITAMIN D3 PO), Take 2 tablets by mouth daily., Disp: , Rfl:    hydrochlorothiazide (HYDRODIURIL) 25 MG tablet, TAKE 1 TABLET(25 MG) BY MOUTH DAILY, Disp: 90 tablet, Rfl: 3   Omega-3 Fatty Acids (FISH OIL) 1000 MG CAPS, Take 1 capsule by mouth 1 day or 1 dose., Disp: , Rfl:    polyethylene glycol (MIRALAX / GLYCOLAX) packet, Take 17 g by mouth 3 (three) times a week. , Disp: , Rfl:    pravastatin  (PRAVACHOL) 40 MG tablet, Take 1 tablet (40 mg total) by mouth daily., Disp: 30 tablet, Rfl: 11   vitamin B-12 (CYANOCOBALAMIN) 1000 MCG tablet, Take 1,000 mcg by mouth daily., Disp: , Rfl:   Physical exam:  Vitals:   07/18/21 1007  BP: (!) 156/73  Pulse: 92  Resp: 20  Temp: 99.3 F (37.4 C)  TempSrc: Tympanic  SpO2: 98%  Weight: 200 lb (90.7 kg)   Physical Exam Constitutional:      General: She is not in acute distress. Cardiovascular:     Rate and Rhythm: Normal rate and regular rhythm.     Heart sounds: Normal heart sounds.  Pulmonary:     Effort: Pulmonary effort is normal.     Breath sounds: Normal breath sounds.  Abdominal:     General: Bowel sounds are normal.  Palpations: Abdomen is soft.  Skin:    General: Skin is warm and dry.  Neurological:     Mental Status: She is alert and oriented to person, place, and time.   Breast exam was performed in seated and lying down position. Patient is status post right lumpectomy with a well-healed surgical scar. No evidence of any palpable masses. No evidence of axillary adenopathy. No evidence of any palpable masses or lumps in the left breast. No evidence of leftt axillary adenopathy   CMP Latest Ref Rng & Units 06/07/2021  Glucose 70 - 99 mg/dL 110(H)  BUN 6 - 23 mg/dL 20  Creatinine 0.40 - 1.20 mg/dL 1.05  Sodium 135 - 145 mEq/L 139  Potassium 3.5 - 5.1 mEq/L 3.4(L)  Chloride 96 - 112 mEq/L 98  CO2 19 - 32 mEq/L 29  Calcium 8.4 - 10.5 mg/dL 9.6  Total Protein 6.0 - 8.3 g/dL 7.4  Total Bilirubin 0.2 - 1.2 mg/dL 1.0  Alkaline Phos 39 - 117 U/L 80  AST 0 - 37 U/L 32  ALT 0 - 35 U/L 46(H)   CBC Latest Ref Rng & Units 06/07/2021  WBC 4.0 - 10.5 K/uL 8.8  Hemoglobin 12.0 - 15.0 g/dL 15.4(H)  Hematocrit 36.0 - 46.0 % 44.1  Platelets 150.0 - 400.0 K/uL 262.0     Assessment and plan- Patient is a 70 y.o. female with  invasive mammary carcinoma of the right breast pathological prognostic stage IA pT1 pN0 cM0, grade 1 ER  PR positive HER-2/neu negative status post lumpectomy and adjuvant radiation treatment.  She is currently on Arimidex and this is a routine follow-up visit for breast cancer  Clinically patient is doing well with no concerning signs and symptoms of recurrence based on today's exam.  She is tolerating Arimidex well without any significant side effects.She is due for mammogram in November 2022 which we will schedule.  She is also overdue for a bone density scan and we will get that scheduled in November as well.  I will see her back in 6 months for in person or virtual visit no labs   Visit Diagnosis 1. Encounter for follow-up surveillance of breast cancer   2. Visit for monitoring Arimidex therapy      Dr. Randa Evens, MD, MPH [facility] at [facility] [phone] 07/18/2021 4:29 PM

## 2021-08-05 ENCOUNTER — Other Ambulatory Visit: Payer: Self-pay | Admitting: Family Medicine

## 2021-09-27 ENCOUNTER — Other Ambulatory Visit: Payer: Self-pay | Admitting: Oncology

## 2021-10-17 ENCOUNTER — Other Ambulatory Visit: Payer: Self-pay

## 2021-10-17 ENCOUNTER — Ambulatory Visit
Admission: RE | Admit: 2021-10-17 | Discharge: 2021-10-17 | Disposition: A | Discharge status: Home / Self Care | Payer: PPO | Source: Ambulatory Visit | Attending: Oncology | Admitting: Oncology

## 2021-10-17 DIAGNOSIS — Z5181 Encounter for therapeutic drug level monitoring: Secondary | ICD-10-CM | POA: Insufficient documentation

## 2021-10-17 DIAGNOSIS — Z79811 Long term (current) use of aromatase inhibitors: Secondary | ICD-10-CM | POA: Diagnosis not present

## 2021-10-17 DIAGNOSIS — Z08 Encounter for follow-up examination after completed treatment for malignant neoplasm: Secondary | ICD-10-CM | POA: Diagnosis not present

## 2021-10-17 DIAGNOSIS — Z853 Personal history of malignant neoplasm of breast: Secondary | ICD-10-CM

## 2021-10-17 DIAGNOSIS — Z1231 Encounter for screening mammogram for malignant neoplasm of breast: Secondary | ICD-10-CM | POA: Diagnosis not present

## 2021-10-17 DIAGNOSIS — Z780 Asymptomatic menopausal state: Secondary | ICD-10-CM | POA: Diagnosis not present

## 2022-01-23 ENCOUNTER — Inpatient Hospital Stay: Payer: PPO | Attending: Oncology | Admitting: Oncology

## 2022-01-23 ENCOUNTER — Other Ambulatory Visit: Payer: Self-pay

## 2022-01-23 ENCOUNTER — Encounter: Payer: Self-pay | Admitting: Oncology

## 2022-01-23 VITALS — BP 147/72 | HR 78 | Temp 98.7°F | Resp 19 | Wt 195.8 lb

## 2022-01-23 DIAGNOSIS — Z170 Estrogen receptor positive status [ER+]: Secondary | ICD-10-CM | POA: Diagnosis not present

## 2022-01-23 DIAGNOSIS — Z7982 Long term (current) use of aspirin: Secondary | ICD-10-CM | POA: Insufficient documentation

## 2022-01-23 DIAGNOSIS — Z79899 Other long term (current) drug therapy: Secondary | ICD-10-CM | POA: Diagnosis not present

## 2022-01-23 DIAGNOSIS — Z5181 Encounter for therapeutic drug level monitoring: Secondary | ICD-10-CM

## 2022-01-23 DIAGNOSIS — Z08 Encounter for follow-up examination after completed treatment for malignant neoplasm: Secondary | ICD-10-CM

## 2022-01-23 DIAGNOSIS — Z853 Personal history of malignant neoplasm of breast: Secondary | ICD-10-CM | POA: Diagnosis not present

## 2022-01-23 DIAGNOSIS — C50911 Malignant neoplasm of unspecified site of right female breast: Secondary | ICD-10-CM | POA: Insufficient documentation

## 2022-01-23 DIAGNOSIS — M8580 Other specified disorders of bone density and structure, unspecified site: Secondary | ICD-10-CM | POA: Diagnosis not present

## 2022-01-23 DIAGNOSIS — Z923 Personal history of irradiation: Secondary | ICD-10-CM | POA: Insufficient documentation

## 2022-01-23 DIAGNOSIS — Z79811 Long term (current) use of aromatase inhibitors: Secondary | ICD-10-CM

## 2022-01-23 NOTE — Progress Notes (Signed)
Hematology/Oncology Consult note [facility]  Telephone:([phone] [phone]  Patient Care Team: Jinny Sanders, MD as PCP - General Magrinat, Virgie Dad, MD (Inactive) as Consulting Physician (Oncology) Fanny Skates, MD as Consulting Physician (General Surgery) Noreene Filbert, MD as Referring Physician (Radiation Oncology) Lloyd Huger, MD as Consulting Physician (Oncology)   Name of the patient: Bridget Cummings  [id number]  24-Mar-1951   Date of visit: 01/23/22  Diagnosis- invasive mammary carcinoma of the right breast pathological prognostic stage IA pT1 ppN0 cM0, grade 1 ER PR positive HER-2/neu negative status post lumpectomy and adjuvant radiation treatment    Chief complaint/ Reason for visit-routine follow-up of breast cancer on Arimidex  Heme/Onc history: patient is a 71 year old female who was diagnosed with left breast cancer in November 2008.  Pathology at that time showed a 1.7 cm DCIS that was ER positive.  She underwent external beam radiation and took tamoxifen for 4 years.  Patient found to have stage I invasive mammary carcinoma ER/PR positive HER-2/neu -8 mm grade 1 in the right breast s/p lumpectomy and adjuvant radiation treatment in 2019.  She did not require adjuvant chemotherapy.  She completed adjuvant radiation therapy and started on Arimidex in February 2020.  She is s/p bilateral oophorectomy in 2009 and has an intact uterus.    Interval history-patient is tolerating Arimidex well along with calcium and vitamin D.  She denies any breast concerns at this time.  Appetite and weight have remained stable.  Denies any new aches and pains anywhere  ECOG PS- 0 Pain scale- 0   Review of systems- Review of Systems  Constitutional:  Negative for chills, fever, malaise/fatigue and weight loss.  HENT:  Negative for congestion, ear discharge and nosebleeds.   Eyes:  Negative for blurred vision.  Respiratory:  Negative  for cough, hemoptysis, sputum production, shortness of breath and wheezing.   Cardiovascular:  Negative for chest pain, palpitations, orthopnea and claudication.  Gastrointestinal:  Negative for abdominal pain, blood in stool, constipation, diarrhea, heartburn, melena, nausea and vomiting.  Genitourinary:  Negative for dysuria, flank pain, frequency, hematuria and urgency.  Musculoskeletal:  Negative for back pain, joint pain and myalgias.  Skin:  Negative for rash.  Neurological:  Negative for dizziness, tingling, focal weakness, seizures, weakness and headaches.  Endo/Heme/Allergies:  Does not bruise/bleed easily.  Psychiatric/Behavioral:  Negative for depression and suicidal ideas. The patient does not have insomnia.     Allergies  Allergen Reactions   Peppermint Flavor     sneezing     Past Medical History:  Diagnosis Date   Allergy    Breast cancer ([place]) 2008   Left Breast Cancer   Breast cancer ([place]) 2019   Right Breast Cancer   Family history of breast cancer    Hyperlipidemia    Personal history of radiation therapy 2008   Left Breast Cancer   Personal history of radiation therapy 2019   Right Breast Cancer     Past Surgical History:  Procedure Laterality Date   BREAST BIOPSY     BREAST LUMPECTOMY Left 2008   BREAST LUMPECTOMY Right 11/04/2018   BREAST LUMPECTOMY WITH RADIOACTIVE SEED AND SENTINEL LYMPH NODE BIOPSY Right 11/04/2018   Procedure: RIGHT BREAST LUMPECTOMY WITH RADIOACTIVE SEED AND RIGHT AXILLARY SENTINEL LYMPH NODE BIOPSY;  Surgeon: Fanny Skates, MD;  Location: [place];  Service: General;  Laterality: Right;   FOOT SURGERY     x5 total   OOPHORECTOMY  OVARY REMOVED  2009   TONSILLECTOMY      Social History   Socioeconomic History   Marital status: Married    Spouse name: Not on file   Number of children: 2   Years of education: Not on file   Highest education level: Not on file  Occupational History   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    Employer:  [employer]  Tobacco Use   Smoking status: Never   Smokeless tobacco: Never  Vaping Use   Vaping Use: Never used  Substance and Sexual Activity   Alcohol use: No   Drug use: No   Sexual activity: Yes  Other Topics Concern   Not on file  Social History Narrative   Regular exercise-yes, walks 45 minutes every day   Diet: fruit and veggies and chicken avoiding salt   Social Determinants of Health   Financial Resource Strain: Low Risk    Difficulty of Paying Living Expenses: Not hard at all  Food Insecurity: No Food Insecurity   Worried About Charity fundraiser in the Last Year: Never true   [place] in the Last Year: Never true  Transportation Needs: No Transportation Needs   Lack of Transportation (Medical): No   Lack of Transportation (Non-Medical): No  Physical Activity: Insufficiently Active   Days of Exercise per Week: 7 days   Minutes of Exercise per Session: 20 min  Stress: No Stress Concern Present   Feeling of Stress : Not at all  Social Connections: Not on file  Intimate Partner Violence: Not At Risk   Fear of Current or Ex-Partner: No   Emotionally Abused: No   Physically Abused: No   Sexually Abused: No    Family History  Problem Relation Age of Onset   Breast cancer Mother 54   Diabetes Father    Breast cancer Sister 18   Breast cancer Maternal Aunt 72   Heart attack Maternal Grandmother 75   Breast cancer Cousin        are dx unk     Current Outpatient Medications:    anastrozole (ARIMIDEX) 1 MG tablet, TAKE 1 TABLET(1 MG) BY MOUTH DAILY, Disp: 90 tablet, Rfl: 3   aspirin 81 MG tablet, Take 81 mg by mouth at bedtime., Disp: , Rfl:    Calcium Carb-Cholecalciferol (CALCIUM + VITAMIN D3 PO), Take 2 tablets by mouth daily., Disp: , Rfl:    hydrochlorothiazide (HYDRODIURIL) 25 MG tablet, TAKE 1 TABLET(25 MG) BY MOUTH DAILY, Disp: 90 tablet, Rfl: 3   Omega-3 Fatty Acids (FISH OIL) 1000 MG CAPS, Take 1 capsule by mouth 1 day or 1 dose.,  Disp: , Rfl:    polyethylene glycol (MIRALAX / GLYCOLAX) packet, Take 17 g by mouth 3 (three) times a week. , Disp: , Rfl:    pravastatin (PRAVACHOL) 40 MG tablet, Take 1 tablet (40 mg total) by mouth daily., Disp: 30 tablet, Rfl: 11   vitamin B-12 (CYANOCOBALAMIN) 1000 MCG tablet, Take 1,000 mcg by mouth daily., Disp: , Rfl:   Physical exam:  Vitals:   01/23/22 1153  BP: (!) 147/72  Pulse: 78  Resp: 19  Temp: 98.7 F (37.1 C)  SpO2: 98%  Weight: 195 lb 12.8 oz (88.8 kg)   Physical Exam Constitutional:      General: She is not in acute distress. Cardiovascular:     Rate and Rhythm: Normal rate and regular rhythm.     Heart sounds: Normal heart sounds.  Pulmonary:  Effort: Pulmonary effort is normal.     Breath sounds: Normal breath sounds.  Abdominal:     General: Bowel sounds are normal.     Palpations: Abdomen is soft.  Skin:    General: Skin is warm and dry.  Neurological:     Mental Status: She is alert and oriented to person, place, and time.   Breast exam was performed in seated and lying down position. Patient is status post right lumpectomy with a well-healed surgical scar. No evidence of any palpable masses. No evidence of axillary adenopathy. No evidence of any palpable masses or lumps in the left breast. No evidence of leftt axillary adenopathy   CMP Latest Ref Rng & Units 06/07/2021  Glucose 70 - 99 mg/dL 110(H)  BUN 6 - 23 mg/dL 20  Creatinine 0.40 - 1.20 mg/dL 1.05  Sodium 135 - 145 mEq/L 139  Potassium 3.5 - 5.1 mEq/L 3.4(L)  Chloride 96 - 112 mEq/L 98  CO2 19 - 32 mEq/L 29  Calcium 8.4 - 10.5 mg/dL 9.6  Total Protein 6.0 - 8.3 g/dL 7.4  Total Bilirubin 0.2 - 1.2 mg/dL 1.0  Alkaline Phos 39 - 117 U/L 80  AST 0 - 37 U/L 32  ALT 0 - 35 U/L 46(H)   CBC Latest Ref Rng & Units 06/07/2021  WBC 4.0 - 10.5 K/uL 8.8  Hemoglobin 12.0 - 15.0 g/dL 15.4(H)  Hematocrit 36.0 - 46.0 % 44.1  Platelets 150.0 - 400.0 K/uL 262.0     Assessment and plan- Patient is  a 71 y.o. female with  invasive mammary carcinoma of the right breast pathological prognostic stage IA pT1 pN0 cM0, grade 1 ER PR positive HER-2/neu negative status post lumpectomy and adjuvant radiation treatment.  She is here for routine follow-up of breast cancer on Arimidex  Patient will now be starting year 4 of Arimidex which she is tolerating it well along with calcium and vitamin D.  Her recent bone density scan from November 2022 did show osteopenia with a T score of -2.3 at the left forearm.I plan to repeat her bone density scan again next year as there is a further decline in her bone health I will consider starting her on tamoxifen for the remainder of 1 year.  Her 10-year probability of a major osteoporotic fracture remains less than 20% and hip fracture less than 3%.  She therefore does not require any adjuvant bisphosphonates at this time.  Her recent screening mammogram from November 2022 was also unremarkable.  I will see her back in 6 months with 25-hydroxy vitamin D levels   Visit Diagnosis 1. Encounter for follow-up surveillance of breast cancer   2. Visit for monitoring Arimidex therapy      Dr. Randa Evens, MD, MPH [facility] [phone] 01/23/2022 1:49 PM

## 2022-03-03 ENCOUNTER — Ambulatory Visit
Admission: RE | Admit: 2022-03-03 | Discharge: 2022-03-03 | Disposition: A | Discharge status: Home / Self Care | Payer: PPO | Source: Ambulatory Visit | Attending: Radiation Oncology | Admitting: Radiation Oncology

## 2022-03-03 ENCOUNTER — Encounter: Payer: Self-pay | Admitting: Radiation Oncology

## 2022-03-03 VITALS — BP 140/77 | HR 80 | Temp 98.5°F | Resp 18 | Ht 66.0 in | Wt 194.0 lb

## 2022-03-03 DIAGNOSIS — Z08 Encounter for follow-up examination after completed treatment for malignant neoplasm: Secondary | ICD-10-CM | POA: Diagnosis not present

## 2022-03-03 DIAGNOSIS — Z79811 Long term (current) use of aromatase inhibitors: Secondary | ICD-10-CM | POA: Diagnosis not present

## 2022-03-03 DIAGNOSIS — Z170 Estrogen receptor positive status [ER+]: Secondary | ICD-10-CM | POA: Insufficient documentation

## 2022-03-03 DIAGNOSIS — C50211 Malignant neoplasm of upper-inner quadrant of right female breast: Secondary | ICD-10-CM | POA: Diagnosis not present

## 2022-03-03 DIAGNOSIS — Z923 Personal history of irradiation: Secondary | ICD-10-CM | POA: Diagnosis not present

## 2022-03-03 NOTE — Progress Notes (Signed)
Radiation Oncology ?Follow up Note ? ?Name: Bridget Cummings   ?Date:   03/03/2022 ?MRN:  [id number] ?DOB: Feb 22, 1951  ? ? ?This 71 y.o. female presents to the clinic today for 3-year follow-up status post whole breast radiation to her right breast for stage I ER/PR positive invasive mammary carcinoma. ? ?REFERRING PROVIDER: Jinny Sanders, MD ? ?HPI: Patient is a 71 year old female now at 3 years having completed whole breast radiation to her right breast for stage I ER/PR positive invasive mammary carcinoma.  Seen today in routine follow-up she is doing well.  She specifically denies breast tenderness cough or bone pain.  She is currently on Arimidex tolerating that well without side effect.  She had mammograms back in.  November which were BI-RADS 1 negative. ? ?COMPLICATIONS OF TREATMENT: none ? ?FOLLOW UP COMPLIANCE: keeps appointments  ? ?PHYSICAL EXAM:  ?BP 140/77   Pulse 80   Temp 98.5 ?F (36.9 ?C)   Resp 18   Ht '5\' 6"'$  (1.676 m)   Wt 194 lb (88 kg)   BMI 31.31 kg/m?  ?Lungs are clear to A&P cardiac examination essentially unremarkable with regular rate and rhythm. No dominant mass or nodularity is noted in either breast in 2 positions examined. Incision is well-healed. No axillary or supraclavicular adenopathy is appreciated. Cosmetic result is excellent.  Well-developed well-nourished patient in NAD. HEENT reveals PERLA, EOMI, discs not visualized.  Oral cavity is clear. No oral mucosal lesions are identified. Neck is clear without evidence of cervical or supraclavicular adenopathy. Lungs are clear to A&P. Cardiac examination is essentially unremarkable with regular rate and rhythm without murmur rub or thrill. Abdomen is benign with no organomegaly or masses noted. Motor sensory and DTR levels are equal and symmetric in the upper and lower extremities. Cranial nerves II through XII are grossly intact. Proprioception is intact. No peripheral adenopathy or edema is identified. No motor or sensory  levels are noted. Crude visual fields are within normal range. ? ?RADIOLOGY RESULTS: Mammograms reviewed compatible with above-stated findings  ? ?PLAN: Present time patient is doing well now 3 years out with no evidence of disease.  I am pleased with her overall progress.  I have asked to see her back in 1 year for follow-up and then will discontinue care.  Patient continues on Arimidex.  She has been followed by Dr. Janese Banks for early possible osteoporosis. ? ?I would like to take this opportunity to thank you for allowing me to participate in the care of your patient.. ?  ? Noreene Filbert, MD ? ?

## 2022-03-21 ENCOUNTER — Other Ambulatory Visit: Payer: Self-pay | Admitting: Family Medicine

## 2022-04-13 DIAGNOSIS — L821 Other seborrheic keratosis: Secondary | ICD-10-CM | POA: Diagnosis not present

## 2022-04-13 DIAGNOSIS — X32XXXA Exposure to sunlight, initial encounter: Secondary | ICD-10-CM | POA: Diagnosis not present

## 2022-04-13 DIAGNOSIS — D2262 Melanocytic nevi of left upper limb, including shoulder: Secondary | ICD-10-CM | POA: Diagnosis not present

## 2022-04-13 DIAGNOSIS — D2271 Melanocytic nevi of right lower limb, including hip: Secondary | ICD-10-CM | POA: Diagnosis not present

## 2022-04-13 DIAGNOSIS — L570 Actinic keratosis: Secondary | ICD-10-CM | POA: Diagnosis not present

## 2022-04-13 DIAGNOSIS — D225 Melanocytic nevi of trunk: Secondary | ICD-10-CM | POA: Diagnosis not present

## 2022-04-13 DIAGNOSIS — D2261 Melanocytic nevi of right upper limb, including shoulder: Secondary | ICD-10-CM | POA: Diagnosis not present

## 2022-04-13 DIAGNOSIS — D2272 Melanocytic nevi of left lower limb, including hip: Secondary | ICD-10-CM | POA: Diagnosis not present

## 2022-05-17 ENCOUNTER — Telehealth: Payer: Self-pay | Admitting: Family Medicine

## 2022-05-17 NOTE — Telephone Encounter (Signed)
Left message for patient to call back and schedule Medicare Annual Wellness Visit (AWV) either virtually or phone   Last AWV 05/17/21   I left my direct # [phone]

## 2022-05-17 NOTE — Telephone Encounter (Signed)
Patient returning call re: [place] appointment, transferred the patient to the extension below.

## 2022-05-17 NOTE — Telephone Encounter (Signed)
Scheduled awv 05/22/22

## 2022-05-22 ENCOUNTER — Ambulatory Visit (INDEPENDENT_AMBULATORY_CARE_PROVIDER_SITE_OTHER): Payer: PPO

## 2022-05-22 VITALS — Ht 65.0 in | Wt 195.0 lb

## 2022-05-22 DIAGNOSIS — Z0000 Encounter for general adult medical examination without abnormal findings: Secondary | ICD-10-CM | POA: Diagnosis not present

## 2022-05-22 NOTE — Progress Notes (Signed)
Subjective:   Bridget Cummings is a 71 y.o. female who presents for Medicare Annual (Subsequent) preventive examination.  Review of Systems    Virtual Visit via Telephone Note  I connected with  Bridget Cummings on 05/22/22 at 12:30 PM EDT by telephone and verified that I am speaking with the correct person using two identifiers.  Location: Patient: Home Provider: Office Persons participating in the virtual visit: patient/Nurse Health Advisor   I discussed the limitations, risks, security and privacy concerns of performing an evaluation and management service by telephone and the availability of in person appointments. The patient expressed understanding and agreed to proceed.  Interactive audio and video telecommunications were attempted between this nurse and patient, however failed, due to patient having technical difficulties OR patient did not have access to video capability.  We continued and completed visit with audio only.  Some vital signs may be absent or patient reported.   Criselda Peaches, LPN  Cardiac Risk Factors include: advanced age (>66mn, >>54women);hypertension     Objective:    Today's Vitals   05/22/22 1235  Weight: 195 lb (88.5 kg)  Height: '5\' 5"'$  (1.651 m)   Body mass index is 32.45 kg/m.     05/22/2022   12:41 PM 01/23/2022   11:56 AM 05/17/2021   12:41 PM 03/03/2020    9:24 AM 12/25/2019   10:10 AM 08/25/2019    9:21 AM 05/29/2019   10:21 AM  Advanced Directives  Does Patient Have a Medical Advance Directive? No No No No No No No  Would patient like information on creating a medical advance directive? No - Patient declined No - Patient declined No - Patient declined No - Patient declined No - Patient declined No - Patient declined No - Patient declined    Current Medications (verified) Outpatient Encounter Medications as of 05/22/2022  Medication Sig   anastrozole (ARIMIDEX) 1 MG tablet TAKE 1 TABLET(1 MG) BY MOUTH DAILY   aspirin 81 MG  tablet Take 81 mg by mouth at bedtime.   Calcium Carb-Cholecalciferol (CALCIUM + VITAMIN D3 PO) Take 2 tablets by mouth daily.   hydrochlorothiazide (HYDRODIURIL) 25 MG tablet TAKE 1 TABLET(25 MG) BY MOUTH DAILY   Omega-3 Fatty Acids (FISH OIL) 1000 MG CAPS Take 1 capsule by mouth 1 day or 1 dose.   polyethylene glycol (MIRALAX / GLYCOLAX) packet Take 17 g by mouth 3 (three) times a week.    pravastatin (PRAVACHOL) 40 MG tablet Take 1 tablet (40 mg total) by mouth daily.   vitamin B-12 (CYANOCOBALAMIN) 1000 MCG tablet Take 1,000 mcg by mouth daily.   No facility-administered encounter medications on file as of 05/22/2022.    Allergies (verified) Peppermint flavor   History: Past Medical History:  Diagnosis Date   Allergy    [place] [place]   [place]   [facility] [place] [place]   [place]   Family history of breast cancer    Hyperlipidemia    Personal history of radiation therapy 2008   Left Breast Cancer   Personal history of radiation therapy 2019   Right Breast Cancer   Past Surgical History:  Procedure Laterality Date   BREAST BIOPSY     BREAST LUMPECTOMY Left 2008   BREAST LUMPECTOMY Right 11/04/2018   BREAST LUMPECTOMY WITH RADIOACTIVE SEED AND SENTINEL LYMPH NODE BIOPSY Right 11/04/2018   Procedure: RIGHT BREAST LUMPECTOMY WITH RADIOACTIVE SEED AND RIGHT AXILLARY SENTINEL LYMPH NODE BIOPSY;  Surgeon: IDalbert Batman  Renelda Loma, MD;  Location: [facility];  Service: General;  Laterality: Right;   FOOT SURGERY     x5 total   OOPHORECTOMY     OVARY REMOVED  2009   TONSILLECTOMY     Family History  Problem Relation Age of Onset   Breast cancer Mother 41   Diabetes Father    Breast cancer Sister 68   Breast cancer Maternal Aunt 72   Heart attack Maternal Grandmother 47   Breast cancer Cousin        are dx unk   Social History   Socioeconomic History   Marital status: Married    Spouse name: Not on file   Number of children: 2   Years of  education: Not on file   Highest education level: Not on file  Occupational History   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    Employer: [employer]  Tobacco Use   Smoking status: Never   Smokeless tobacco: Never  Vaping Use   Vaping Use: Never used  Substance and Sexual Activity   Alcohol use: No   Drug use: No   Sexual activity: Yes  Other Topics Concern   Not on file  Social History Narrative   Regular exercise-yes, walks 45 minutes every day   Diet: fruit and veggies and chicken avoiding salt   Social Determinants of Health   Financial Resource Strain: Low Risk  (05/22/2022)   Overall Financial Resource Strain (CARDIA)    Difficulty of Paying Living Expenses: Not hard at all  Food Insecurity: No Food Insecurity (05/22/2022)   Hunger Vital Sign    Worried About Running Out of Food in the Last Year: Never true    Ran Out of Food in the Last Year: Never true  Transportation Needs: No Transportation Needs (05/22/2022)   PRAPARE - Hydrologist (Medical): No    Lack of Transportation (Non-Medical): No  Physical Activity: Sufficiently Active (05/22/2022)   Exercise Vital Sign    Days of Exercise per Week: 4 days    Minutes of Exercise per Session: 120 min  Stress: No Stress Concern Present (05/22/2022)   [place]    Feeling of Stress : Not at all  Social Connections: [place] (05/22/2022)   Social Connection and Isolation Panel [NHANES]    Frequency of Communication with Friends and Family: More than three times a week    Frequency of Social Gatherings with Friends and Family: More than three times a week    Attends Religious Services: More than 4 times per year    Active Member of [employer] or Organizations: Yes    Attends Music therapist: More than 4 times per year    Marital Status: Married    Tobacco Counseling Counseling given: Not Answered   Clinical  Intake:  Pre-visit preparation completed: No Diabetic?  No  Interpreter Needed?: No Activities of Daily Living    05/22/2022   12:40 PM  In your present state of health, do you have any difficulty performing the following activities:  Hearing? 0  Vision? 0  Difficulty concentrating or making decisions? 0  Walking or climbing stairs? 0  Dressing or bathing? 0  Doing errands, shopping? 0  Preparing Food and eating ? N  Using the Toilet? N  In the past six months, have you accidently leaked urine? N  Do you have problems with loss of bowel control? N  Managing your Medications? N  Managing your Finances? N  Housekeeping or managing your Housekeeping? N    Patient Care Team: Jinny Sanders, MD as PCP - General Magrinat, Virgie Dad, MD (Inactive) as Consulting Physician (Oncology) Fanny Skates, MD as Consulting Physician (General Surgery) Noreene Filbert, MD as Referring Physician (Radiation Oncology) Lloyd Huger, MD as Consulting Physician (Oncology)  Indicate any recent Medical Services you may have received from other than [facility] providers in the past year (date may be approximate).     Assessment:   This is a routine wellness examination for Bridget Cummings.  Hearing/Vision screen Hearing Screening - Comments:: No hearing  difficulty Vision Screening - Comments:: Wears reading glasses. Followed by Dr Marvel Plan  Dietary issues and exercise activities discussed: Exercise limited by: None identified   Goals Addressed               This Visit's Progress     Patient Stated (pt-stated)         I will continue to walk daily for about 20 minutes..        Depression Screen    05/22/2022   12:38 PM 05/17/2021   12:43 PM 05/21/2020   10:05 AM 05/16/2019   10:43 AM 02/22/2018   12:25 PM  PHQ 2/9 Scores  PHQ - 2 Score 0 0 0 0 0  PHQ- 9 Score  0       Fall Risk    05/22/2022   12:40 PM 05/17/2021   12:42 PM 05/21/2020   10:06 AM 05/16/2019   10:43 AM 02/22/2018    12:25 PM  Fall Risk   Falls in the past year? 0 0 0 0 No  Number falls in past yr: 0 0     Injury with Fall? 0 0     Risk for fall due to : No Fall Risks Medication side effect     Follow up  Falls evaluation completed;Falls prevention discussed       FALL RISK PREVENTION PERTAINING TO THE HOME:  Any stairs in or around the home? Yes  If so, are there any without handrails? No  Home free of loose throw rugs in walkways, pet beds, electrical cords, etc? Yes  Adequate lighting in your home to reduce risk of falls? Yes  ASSISTIVE DEVICES UTILIZED TO PREVENT FALLS:  Life alert? No  Use of a cane, walker or w/c? No  Grab bars in the bathroom? No  Shower chair or bench in shower? No  Elevated toilet seat or a handicapped toilet? No   TIMED UP AND GO:  Was the test performed? No . Audio Visit Cognitive Function:    05/17/2021   12:44 PM  MMSE - Mini Mental State Exam  Orientation to time 5  Orientation to Place 5  Registration 3  Attention/ Calculation 5  Recall 3  Language- repeat 1        05/22/2022   12:42 PM  6CIT Screen  What Year? 0 points  What month? 0 points  What time? 0 points  Count back from 20 0 points  Months in reverse 0 points  Repeat phrase 0 points  Total Score 0 points    Immunizations Immunization History  Administered Date(s) Administered   Fluad Quad(high Dose 65+) 08/23/2019   Influenza Split 09/21/2011, 09/26/2012   Influenza Whole 12/04/2005, 12/20/2007, 08/24/2008, 09/21/2009, 09/13/2010   Influenza, High Dose Seasonal PF 08/28/2017   Influenza, Seasonal, Injecte, Preservative Fre 08/31/2014, 09/04/2015, 09/05/2016   Influenza,inj,Quad PF,6+ Mos 09/30/2013   Influenza,trivalent,  recombinat, inj, PF 07/30/2018   Moderna Sars-Covid-2 Vaccination 01/07/2020, 02/04/2020   Pneumococcal Conjugate-13 06/21/2017   Pneumococcal Polysaccharide-23 06/26/2018   Td 12/05/1999, 09/13/2010   Zoster, Live 09/26/2012    TDAP status: Due,  Education has been provided regarding the importance of this vaccine. Advised may receive this vaccine at local pharmacy or Health Dept. Aware to provide a copy of the vaccination record if obtained from local pharmacy or Health Dept. Verbalized acceptance and understanding.  Flu Vaccine status: Up to date  Pneumococcal vaccine status: Up to date  Covid-19 vaccine status: Completed vaccines  Qualifies for Shingles Vaccine? Yes   Zostavax completed Yes   Shingrix Completed?: Yes  Screening Tests Health Maintenance  Topic Date Due   Zoster Vaccines- Shingrix (1 of 2) Never done   COVID-19 Vaccine (3 - Moderna risk series) 03/03/2020   TETANUS/TDAP  05/23/2023 (Originally 09/13/2020)   INFLUENZA VACCINE  07/04/2022   MAMMOGRAM  10/18/2023   DEXA SCAN  10/17/2026   COLONOSCOPY (Pts 45-4yr Insurance coverage will need to be confirmed)  12/02/2029   Pneumonia Vaccine 70 Years old  Completed   Hepatitis C Screening  Completed   HPV VACCINES  Aged Out    Health Maintenance  Health Maintenance Due  Topic Date Due   Zoster Vaccines- Shingrix (1 of 2) Never done   COVID-19 Vaccine (3 - Moderna risk series) 03/03/2020    Colorectal cancer screening: Type of screening: Colonoscopy. Completed 12/03/19. Repeat every 10 years  Mammogram status: Completed 10/17/21. Repeat every year  Bone Density status: Completed 10/17/21. Results reflect: Bone density results: OSTEOPENIA. Repeat every 5 years.  Lung Cancer Screening: (Low Dose CT Chest recommended if Age 423-80years, 30 pack-year currently smoking OR have quit w/in 15years.) does not qualify.     Additional Screening:  Hepatitis C Screening: does qualify; Completed 11/113  Vision Screening: Recommended annual ophthalmology exams for early detection of glaucoma and other disorders of the eye. Is the patient up to date with their annual eye exam?  Yes  Who is the provider or what is the name of the office in which the patient  attends annual eye exams? Dr RMarvel PlanIf pt is not established with a provider, would they like to be referred to a provider to establish care? No .   Dental Screening: Recommended annual dental exams for proper oral hygiene  Community Resource Referral / Chronic Care Management:  CRR required this visit?  No   CCM required this visit?  No      Plan:     I have personally reviewed and noted the following in the patient's chart:   Medical and social history Use of alcohol, tobacco or illicit drugs  Current medications and supplements including opioid prescriptions.  Functional ability and status Nutritional status Physical activity Advanced directives List of other physicians Hospitalizations, surgeries, and ER visits in previous 12 months Vitals Screenings to include cognitive, depression, and falls Referrals and appointments  In addition, I have reviewed and discussed with patient certain preventive protocols, quality metrics, and best practice recommendations. A written personalized care plan for preventive services as well as general preventive health recommendations were provided to patient.     BCriselda Peaches LPN   [id number]  Nurse Notes: None

## 2022-05-22 NOTE — Patient Instructions (Signed)
Bridget Cummings , Thank you for taking time to come for your Medicare Wellness Visit. I appreciate your ongoing commitment to your health goals. Please review the following plan we discussed and let me know if I can assist you in the future.   These are the goals we discussed:  Goals       Patient Stated (pt-stated)       I will continue to walk daily for about 20 minutes..         This is a list of the screening recommended for you and due dates:  Health Maintenance  Topic Date Due   Zoster (Shingles) Vaccine (1 of 2) Never done   COVID-19 Vaccine (3 - Moderna risk series) 03/03/2020   Tetanus Vaccine  09/13/2020   Flu Shot  07/04/2022   Mammogram  10/18/2023   DEXA scan (bone density measurement)  10/17/2026   Colon Cancer Screening  12/02/2029   Pneumonia Vaccine  Completed   Hepatitis C Screening: USPSTF Recommendation to screen - Ages 18-79 yo.  Completed   HPV Vaccine  Aged Out   Advanced directives: No  Conditions/risks identified: None  Next appointment: Follow up in one year for your annual wellness visit     Preventive Care 65 Years and Older, Female Preventive care refers to lifestyle choices and visits with your health care provider that can promote health and wellness. What does preventive care include? A yearly physical exam. This is also called an annual well check. Dental exams once or twice a year. Routine eye exams. Ask your health care provider how often you should have your eyes checked. Personal lifestyle choices, including: Daily care of your teeth and gums. Regular physical activity. Eating a healthy diet. Avoiding tobacco and drug use. Limiting alcohol use. Practicing safe sex. Taking low-dose aspirin every day. Taking vitamin and mineral supplements as recommended by your health care provider. What happens during an annual well check? The services and screenings done by your health care provider during your annual well check will depend on  your age, overall health, lifestyle risk factors, and family history of disease. Counseling  Your health care provider may ask you questions about your: Alcohol use. Tobacco use. Drug use. Emotional well-being. Home and relationship well-being. Sexual activity. Eating habits. History of falls. Memory and ability to understand (cognition). Work and work Statistician. Reproductive health. Screening  You may have the following tests or measurements: Height, weight, and BMI. Blood pressure. Lipid and cholesterol levels. These may be checked every 5 years, or more frequently if you are over 18 years old. Skin check. Lung cancer screening. You may have this screening every year starting at age 67 if you have a 30-pack-year history of smoking and currently smoke or have quit within the past 15 years. Fecal occult blood test (FOBT) of the stool. You may have this test every year starting at age 54. Flexible sigmoidoscopy or colonoscopy. You may have a sigmoidoscopy every 5 years or a colonoscopy every 10 years starting at age 87. Hepatitis C blood test. Hepatitis B blood test. Sexually transmitted disease (STD) testing. Diabetes screening. This is done by checking your blood sugar (glucose) after you have not eaten for a while (fasting). You may have this done every 1-3 years. Bone density scan. This is done to screen for osteoporosis. You may have this done starting at age 33. Mammogram. This may be done every 1-2 years. Talk to your health care provider about how often you should have regular  mammograms. Talk with your health care provider about your test results, treatment options, and if necessary, the need for more tests. Vaccines  Your health care provider may recommend certain vaccines, such as: Influenza vaccine. This is recommended every year. Tetanus, diphtheria, and acellular pertussis (Tdap, Td) vaccine. You may need a Td booster every 10 years. Zoster vaccine. You may need this  after age 50. Pneumococcal 13-valent conjugate (PCV13) vaccine. One dose is recommended after age 41. Pneumococcal polysaccharide (PPSV23) vaccine. One dose is recommended after age 36. Talk to your health care provider about which screenings and vaccines you need and how often you need them. This information is not intended to replace advice given to you by your health care provider. Make sure you discuss any questions you have with your health care provider. Document Released: 12/17/2015 Document Revised: 08/09/2016 Document Reviewed: 09/21/2015 Elsevier Interactive Patient Education  2017 [place] Prevention in the Home Falls can cause injuries. They can happen to people of all ages. There are many things you can do to make your home safe and to help prevent falls. What can I do on the outside of my home? Regularly fix the edges of walkways and driveways and fix any cracks. Remove anything that might make you trip as you walk through a door, such as a raised step or threshold. Trim any bushes or trees on the path to your home. Use bright outdoor lighting. Clear any walking paths of anything that might make someone trip, such as rocks or tools. Regularly check to see if handrails are loose or broken. Make sure that both sides of any steps have handrails. Any raised decks and porches should have guardrails on the edges. Have any leaves, snow, or ice cleared regularly. Use sand or salt on walking paths during winter. Clean up any spills in your garage right away. This includes oil or grease spills. What can I do in the bathroom? Use night lights. Install grab bars by the toilet and in the tub and shower. Do not use towel bars as grab bars. Use non-skid mats or decals in the tub or shower. If you need to sit down in the shower, use a plastic, non-slip stool. Keep the floor dry. Clean up any water that spills on the floor as soon as it happens. Remove soap buildup in the tub or  shower regularly. Attach bath mats securely with double-sided non-slip rug tape. Do not have throw rugs and other things on the floor that can make you trip. What can I do in the bedroom? Use night lights. Make sure that you have a light by your bed that is easy to reach. Do not use any sheets or blankets that are too big for your bed. They should not hang down onto the floor. Have a firm chair that has side arms. You can use this for support while you get dressed. Do not have throw rugs and other things on the floor that can make you trip. What can I do in the kitchen? Clean up any spills right away. Avoid walking on wet floors. Keep items that you use a lot in easy-to-reach places. If you need to reach something above you, use a strong step stool that has a grab bar. Keep electrical cords out of the way. Do not use floor polish or wax that makes floors slippery. If you must use wax, use non-skid floor wax. Do not have throw rugs and other things on the floor that can  make you trip. What can I do with my stairs? Do not leave any items on the stairs. Make sure that there are handrails on both sides of the stairs and use them. Fix handrails that are broken or loose. Make sure that handrails are as long as the stairways. Check any carpeting to make sure that it is firmly attached to the stairs. Fix any carpet that is loose or worn. Avoid having throw rugs at the top or bottom of the stairs. If you do have throw rugs, attach them to the floor with carpet tape. Make sure that you have a light switch at the top of the stairs and the bottom of the stairs. If you do not have them, ask someone to add them for you. What else can I do to help prevent falls? Wear shoes that: Do not have high heels. Have rubber bottoms. Are comfortable and fit you well. Are closed at the toe. Do not wear sandals. If you use a stepladder: Make sure that it is fully opened. Do not climb a closed stepladder. Make  sure that both sides of the stepladder are locked into place. Ask someone to hold it for you, if possible. Clearly mark and make sure that you can see: Any grab bars or handrails. First and last steps. Where the edge of each step is. Use tools that help you move around (mobility aids) if they are needed. These include: Canes. Walkers. Scooters. Crutches. Turn on the lights when you go into a dark area. Replace any light bulbs as soon as they burn out. Set up your furniture so you have a clear path. Avoid moving your furniture around. If any of your floors are uneven, fix them. If there are any pets around you, be aware of where they are. Review your medicines with your doctor. Some medicines can make you feel dizzy. This can increase your chance of falling. Ask your doctor what other things that you can do to help prevent falls. This information is not intended to replace advice given to you by your health care provider. Make sure you discuss any questions you have with your health care provider. Document Released: 09/16/2009 Document Revised: 04/27/2016 Document Reviewed: 12/25/2014 Elsevier Interactive Patient Education  2017 [employer].

## 2022-05-29 ENCOUNTER — Other Ambulatory Visit: Payer: Self-pay | Admitting: Family Medicine

## 2022-06-03 ENCOUNTER — Other Ambulatory Visit: Payer: Self-pay | Admitting: Family Medicine

## 2022-06-09 ENCOUNTER — Ambulatory Visit (INDEPENDENT_AMBULATORY_CARE_PROVIDER_SITE_OTHER): Payer: PPO | Admitting: Family Medicine

## 2022-06-09 ENCOUNTER — Encounter: Payer: Self-pay | Admitting: Family Medicine

## 2022-06-09 VITALS — BP 130/80 | HR 88 | Temp 98.3°F | Ht 65.59 in | Wt 188.5 lb

## 2022-06-09 DIAGNOSIS — Z170 Estrogen receptor positive status [ER+]: Secondary | ICD-10-CM | POA: Diagnosis not present

## 2022-06-09 DIAGNOSIS — Z853 Personal history of malignant neoplasm of breast: Secondary | ICD-10-CM

## 2022-06-09 DIAGNOSIS — M8580 Other specified disorders of bone density and structure, unspecified site: Secondary | ICD-10-CM

## 2022-06-09 DIAGNOSIS — Z0000 Encounter for general adult medical examination without abnormal findings: Secondary | ICD-10-CM

## 2022-06-09 DIAGNOSIS — I10 Essential (primary) hypertension: Secondary | ICD-10-CM | POA: Diagnosis not present

## 2022-06-09 DIAGNOSIS — E785 Hyperlipidemia, unspecified: Secondary | ICD-10-CM | POA: Diagnosis not present

## 2022-06-09 DIAGNOSIS — C50211 Malignant neoplasm of upper-inner quadrant of right female breast: Secondary | ICD-10-CM | POA: Diagnosis not present

## 2022-06-09 LAB — LIPID PANEL
Cholesterol: 197 mg/dL (ref 0–200)
HDL: 47.9 mg/dL (ref >39.00)
LDL Cholesterol: 123 mg/dL — ABNORMAL HIGH (ref 0–99)
NonHDL: 149.53
Total CHOL/HDL Ratio: 4
Triglycerides: 131 mg/dL (ref 0.0–149.0)
VLDL: 26.2 mg/dL (ref 0.0–40.0)

## 2022-06-09 LAB — COMPREHENSIVE METABOLIC PANEL
ALT: 40 U/L — ABNORMAL HIGH (ref 0–35)
AST: 37 U/L (ref 0–37)
Albumin: 4.3 g/dL (ref 3.5–5.2)
Alkaline Phosphatase: 86 U/L (ref 39–117)
BUN: 18 mg/dL (ref 6–23)
CO2: 31 mEq/L (ref 19–32)
Calcium: 9.7 mg/dL (ref 8.4–10.5)
Chloride: 99 mEq/L (ref 96–112)
Creatinine, Ser: 0.99 mg/dL (ref 0.40–1.20)
GFR: 57.99 mL/min — ABNORMAL LOW (ref >60.00)
Glucose, Bld: 213 mg/dL — ABNORMAL HIGH (ref 70–99)
Potassium: 3.9 mEq/L (ref 3.5–5.1)
Sodium: 139 mEq/L (ref 135–145)
Total Bilirubin: 0.8 mg/dL (ref 0.2–1.2)
Total Protein: 7 g/dL (ref 6.0–8.3)

## 2022-06-09 MED ORDER — PRAVASTATIN SODIUM 40 MG PO TABS: ORAL_TABLET | ORAL | 3 refills | Status: DC

## 2022-06-09 NOTE — Assessment & Plan Note (Signed)
Breast cancer followed by oncology, Dr. Janese Banks Active treatment with Arimidex at this point.  She has had a significant worsening of her bone density with this medication and Dr. Janese Banks was considering making a change.

## 2022-06-09 NOTE — Assessment & Plan Note (Signed)
Due for reevaluation on pravastatin.

## 2022-06-09 NOTE — Assessment & Plan Note (Addendum)
New diagnosis, likely due to Arimidex. Recommend weight bearing exercise, calcium in diet and vit D supplement 400 IU 1-2 times daily.

## 2022-06-09 NOTE — Patient Instructions (Signed)
Please stop at the lab to have labs drawn.  

## 2022-06-09 NOTE — Progress Notes (Signed)
Patient ID: Bridget Cummings, female    DOB: 1951/07/25, 71 y.o.   MRN: [id number]  This visit was conducted in person.  BP 130/80   Pulse 88   Temp 98.3 F (36.8 C) (Oral)   Ht 5' 5.59" (1.666 m)   Wt 188 lb 8 oz (85.5 kg)   SpO2 97%   BMI 30.81 kg/m    CC: Chief Complaint  Patient presents with   Annual Exam    Part 2    Subjective:   HPI: Bridget Cummings is a 71 y.o. female presenting on 06/09/2022 for Annual Exam (Part 2)  The patient presents for annual medicare wellness, complete physical and review of chronic health problems. He/She also has the following acute concerns today: none  The patient saw a LPN or RN for medicare wellness visit.  Prevention and wellness was reviewed in detail. Note reviewed and important notes copied below.  Hypertension:  Good control on hydrochlorothiazide 25 mg p.o. daily. BP Readings from Last 3 Encounters:  06/09/22 130/80  03/03/22 140/77  01/23/22 (!) 147/72  Using medication without problems or lightheadedness:  none Chest pain with exertion: none Edema: none Short of breath: none Average home BPs: Other issues:  Elevated Cholesterol: Due for reevaluation Lab Results  Component Value Date   CHOL 217 (H) 06/07/2021   HDL 48.90 06/07/2021   LDLCALC 135 (H) 06/07/2021   LDLDIRECT 140.2 09/30/2013   TRIG 164.0 (H) 06/07/2021   CHOLHDL 4 06/07/2021  Using medications without problems: Muscle aches:  Diet compliance: moderate Exercise: good Other complaints:   Hx of breast cancer followed by Dr. Janese Banks. She is on Arimidex,  on year 3/5.     Relevant past medical, surgical, family and social history reviewed and updated as indicated. Interim medical history since our last visit reviewed. Allergies and medications reviewed and updated. Outpatient Medications Prior to Visit  Medication Sig Dispense Refill   anastrozole (ARIMIDEX) 1 MG tablet TAKE 1 TABLET(1 MG) BY MOUTH DAILY 90 tablet 3   aspirin 81 MG tablet  Take 81 mg by mouth at bedtime.     Calcium Carb-Cholecalciferol (CALCIUM + VITAMIN D3 PO) Take 2 tablets by mouth daily.     hydrochlorothiazide (HYDRODIURIL) 25 MG tablet TAKE 1 TABLET(25 MG) BY MOUTH DAILY 90 tablet 0   Omega-3 Fatty Acids (FISH OIL) 1000 MG CAPS Take 1 capsule by mouth 1 day or 1 dose.     polyethylene glycol (MIRALAX / GLYCOLAX) packet Take 17 g by mouth once a week.     pravastatin (PRAVACHOL) 40 MG tablet TAKE 1 TABLET(40 MG) BY MOUTH DAILY 90 tablet 0   vitamin B-12 (CYANOCOBALAMIN) 1000 MCG tablet Take 1,000 mcg by mouth daily.     No facility-administered medications prior to visit.     Per HPI unless specifically indicated in ROS section below Review of Systems  Constitutional:  Negative for fatigue and fever.  HENT:  Negative for congestion.   Eyes:  Negative for pain.  Respiratory:  Negative for cough and shortness of breath.   Cardiovascular:  Negative for chest pain, palpitations and leg swelling.  Gastrointestinal:  Negative for abdominal pain.  Genitourinary:  Negative for dysuria and vaginal bleeding.  Musculoskeletal:  Negative for back pain.  Neurological:  Negative for syncope, light-headedness and headaches.  Psychiatric/Behavioral:  Negative for dysphoric mood.    Objective:  BP 130/80   Pulse 88   Temp 98.3 F (36.8 C) (Oral)   Ht 5'  5.59" (1.666 m)   Wt 188 lb 8 oz (85.5 kg)   SpO2 97%   BMI 30.81 kg/m   Wt Readings from Last 3 Encounters:  06/09/22 188 lb 8 oz (85.5 kg)  05/22/22 195 lb (88.5 kg)  03/03/22 194 lb (88 kg)      Physical Exam Vitals and nursing note reviewed.  Constitutional:      General: She is not in acute distress.    Appearance: Normal appearance. She is well-developed. She is not ill-appearing or toxic-appearing.  HENT:     Head: Normocephalic.     Right Ear: Hearing, tympanic membrane, ear canal and external ear normal.     Left Ear: Hearing, tympanic membrane, ear canal and external ear normal.     Nose:  Nose normal.  Eyes:     General: Lids are normal. Lids are everted, no foreign bodies appreciated.     Conjunctiva/sclera: Conjunctivae normal.     Pupils: Pupils are equal, round, and reactive to light.  Neck:     Thyroid: No thyroid mass or thyromegaly.     Vascular: No carotid bruit.     Trachea: Trachea normal.  Cardiovascular:     Rate and Rhythm: Normal rate and regular rhythm.     Heart sounds: Normal heart sounds, S1 normal and S2 normal. No murmur heard.    No gallop.  Pulmonary:     Effort: Pulmonary effort is normal. No respiratory distress.     Breath sounds: Normal breath sounds. No wheezing, rhonchi or rales.  Abdominal:     General: Bowel sounds are normal. There is no distension or abdominal bruit.     Palpations: Abdomen is soft. There is no fluid wave or mass.     Tenderness: There is no abdominal tenderness. There is no guarding or rebound.     Hernia: No hernia is present.  Musculoskeletal:     Cervical back: Normal range of motion and neck supple.  Lymphadenopathy:     Cervical: No cervical adenopathy.  Skin:    General: Skin is warm and dry.     Findings: No rash.  Neurological:     Mental Status: She is alert.     Cranial Nerves: No cranial nerve deficit.     Sensory: No sensory deficit.  Psychiatric:        Mood and Affect: Mood is not anxious or depressed.        Speech: Speech normal.        Behavior: Behavior normal. Behavior is cooperative.        Judgment: Judgment normal.       Results for orders placed or performed in visit on 06/07/21  Lipid panel  Result Value Ref Range   Cholesterol 217 (H) 0 - 200 mg/dL   Triglycerides 164.0 (H) 0.0 - 149.0 mg/dL   HDL 48.90 >39.00 mg/dL   VLDL 32.8 0.0 - 40.0 mg/dL   LDL Cholesterol 135 (H) 0 - 99 mg/dL   Total CHOL/HDL Ratio 4    NonHDL 168.09   Comprehensive metabolic panel  Result Value Ref Range   Sodium 139 135 - 145 mEq/L   Potassium 3.4 (L) 3.5 - 5.1 mEq/L   Chloride 98 96 - 112 mEq/L    CO2 29 19 - 32 mEq/L   Glucose, Bld 110 (H) 70 - 99 mg/dL   BUN 20 6 - 23 mg/dL   Creatinine, Ser 1.05 0.40 - 1.20 mg/dL   Total Bilirubin 1.0 0.2 -  1.2 mg/dL   Alkaline Phosphatase 80 39 - 117 U/L   AST 32 0 - 37 U/L   ALT 46 (H) 0 - 35 U/L   Total Protein 7.4 6.0 - 8.3 g/dL   Albumin 4.4 3.5 - 5.2 g/dL   GFR 54.42 (L) >60.00 mL/min   Calcium 9.6 8.4 - 10.5 mg/dL  CBC with Differential/Platelet  Result Value Ref Range   WBC 8.8 4.0 - 10.5 K/uL   RBC 4.92 3.87 - 5.11 Mil/uL   Hemoglobin 15.4 (H) 12.0 - 15.0 g/dL   HCT 44.1 36.0 - 46.0 %   MCV 89.7 78.0 - 100.0 fl   MCHC 35.0 30.0 - 36.0 g/dL   RDW 13.1 11.5 - 15.5 %   Platelets 262.0 150.0 - 400.0 K/uL   Neutrophils Relative % 61.5 43.0 - 77.0 %   Lymphocytes Relative 25.0 12.0 - 46.0 %   Monocytes Relative 8.1 3.0 - 12.0 %   Eosinophils Relative 4.3 0.0 - 5.0 %   Basophils Relative 1.1 0.0 - 3.0 %   Neutro Abs 5.4 1.4 - 7.7 K/uL   Lymphs Abs 2.2 0.7 - 4.0 K/uL   Monocytes Absolute 0.7 0.1 - 1.0 K/uL   Eosinophils Absolute 0.4 0.0 - 0.7 K/uL   Basophils Absolute 0.1 0.0 - 0.1 K/uL     COVID 19 screen:  No recent travel or known exposure to COVID19 The patient denies respiratory symptoms of COVID 19 at this time. The importance of social distancing was discussed today.   Assessment and Plan   The patient's preventative maintenance and recommended screening tests for an annual wellness exam were reviewed in full today. Brought up to date unless services declined.  Counselled on the importance of diet, exercise, and its role in overall health and mortality. The patient's FH and SH was reviewed, including their home life, tobacco status, and drug and alcohol status.   Vaccines: uptodate. S/P  COVID vaccine, Shingrix, she just got Td 05/25/2022 screening mammogram...per onc,  hx of breast cancer neg 10/2020 PAP/DVE: no ovaries due to hx of breast cancer, has uterus. On q5 year Pap schedule, last done 2017.. Pap no longer  indicated. No DVE indicated, asymptomatic. Colonoscopy 11/2019 Dr. Earlean Shawl, repeat in 10 years.  She has had 3 hemorrhoid banding. DEXA;  2019 nml, 10/2021 T -2.3.Marland Kitchen she is on Arimidex.  Refused HIV testing.  Hep C: neg  Nonsmoker, ETOH wine rarely  Problem List Items Addressed This Visit     Essential hypertension    Chronic,  well-controlled on hydrochlorothiazide 25 mg daily.      Relevant Medications   pravastatin (PRAVACHOL) 40 MG tablet   History of breast cancer   Hyperlipidemia LDL goal <130    Due for reevaluation on pravastatin.      Relevant Medications   pravastatin (PRAVACHOL) 40 MG tablet   Other Relevant Orders   Lipid panel   Comprehensive metabolic panel   Malignant neoplasm of upper-inner quadrant of right breast in female, estrogen receptor positive ([place])    Breast cancer followed by oncology, Dr. Janese Banks Active treatment with Arimidex at this point.  She has had a significant worsening of her bone density with this medication and Dr. Janese Banks was considering making a change.      Osteopenia    New diagnosis, likely due to Arimidex. Recommend weight bearing exercise, calcium in diet and vit D supplement 400 IU 1-2 times daily.        Other Visit Diagnoses  Routine general medical examination at a health care facility    -  Primary       Eliezer Lofts, MD

## 2022-06-09 NOTE — Assessment & Plan Note (Signed)
Chronic,  well-controlled on hydrochlorothiazide 25 mg daily.

## 2022-06-21 DIAGNOSIS — L570 Actinic keratosis: Secondary | ICD-10-CM | POA: Diagnosis not present

## 2022-06-21 DIAGNOSIS — X32XXXA Exposure to sunlight, initial encounter: Secondary | ICD-10-CM | POA: Diagnosis not present

## 2022-06-25 ENCOUNTER — Other Ambulatory Visit: Payer: Self-pay | Admitting: Family Medicine

## 2022-07-28 ENCOUNTER — Other Ambulatory Visit: Payer: Self-pay

## 2022-07-28 DIAGNOSIS — Z5181 Encounter for therapeutic drug level monitoring: Secondary | ICD-10-CM

## 2022-07-28 DIAGNOSIS — Z08 Encounter for follow-up examination after completed treatment for malignant neoplasm: Secondary | ICD-10-CM

## 2022-07-31 ENCOUNTER — Inpatient Hospital Stay: Payer: PPO | Attending: Oncology

## 2022-07-31 ENCOUNTER — Encounter: Payer: Self-pay | Admitting: Oncology

## 2022-07-31 ENCOUNTER — Inpatient Hospital Stay: Payer: PPO | Admitting: Oncology

## 2022-07-31 VITALS — BP 145/74 | HR 86 | Temp 99.7°F | Resp 18 | Wt 186.3 lb

## 2022-07-31 DIAGNOSIS — M85831 Other specified disorders of bone density and structure, right forearm: Secondary | ICD-10-CM | POA: Diagnosis not present

## 2022-07-31 DIAGNOSIS — Z5181 Encounter for therapeutic drug level monitoring: Secondary | ICD-10-CM | POA: Diagnosis not present

## 2022-07-31 DIAGNOSIS — Z08 Encounter for follow-up examination after completed treatment for malignant neoplasm: Secondary | ICD-10-CM | POA: Diagnosis not present

## 2022-07-31 DIAGNOSIS — Z7982 Long term (current) use of aspirin: Secondary | ICD-10-CM | POA: Insufficient documentation

## 2022-07-31 DIAGNOSIS — Z853 Personal history of malignant neoplasm of breast: Secondary | ICD-10-CM | POA: Diagnosis not present

## 2022-07-31 DIAGNOSIS — Z90722 Acquired absence of ovaries, bilateral: Secondary | ICD-10-CM | POA: Insufficient documentation

## 2022-07-31 DIAGNOSIS — Z79811 Long term (current) use of aromatase inhibitors: Secondary | ICD-10-CM | POA: Insufficient documentation

## 2022-07-31 DIAGNOSIS — Z79899 Other long term (current) drug therapy: Secondary | ICD-10-CM | POA: Insufficient documentation

## 2022-07-31 DIAGNOSIS — Z170 Estrogen receptor positive status [ER+]: Secondary | ICD-10-CM | POA: Insufficient documentation

## 2022-07-31 DIAGNOSIS — C50911 Malignant neoplasm of unspecified site of right female breast: Secondary | ICD-10-CM | POA: Insufficient documentation

## 2022-07-31 DIAGNOSIS — Z923 Personal history of irradiation: Secondary | ICD-10-CM | POA: Diagnosis not present

## 2022-07-31 LAB — CBC WITH DIFFERENTIAL/PLATELET
Abs Immature Granulocytes: 0.05 10*3/uL (ref 0.00–0.07)
Basophils Absolute: 0.1 10*3/uL (ref 0.0–0.1)
Basophils Relative: 1 %
Eosinophils Absolute: 0.2 10*3/uL (ref 0.0–0.5)
Eosinophils Relative: 3 %
HCT: 45 % (ref 36.0–46.0)
Hemoglobin: 15.7 g/dL — ABNORMAL HIGH (ref 12.0–15.0)
Immature Granulocytes: 1 %
Lymphocytes Relative: 30 %
Lymphs Abs: 2.3 10*3/uL (ref 0.7–4.0)
MCH: 31 pg (ref 26.0–34.0)
MCHC: 34.9 g/dL (ref 30.0–36.0)
MCV: 88.8 fL (ref 80.0–100.0)
Monocytes Absolute: 0.6 10*3/uL (ref 0.1–1.0)
Monocytes Relative: 8 %
Neutro Abs: 4.4 10*3/uL (ref 1.7–7.7)
Neutrophils Relative %: 57 %
Platelets: 276 10*3/uL (ref 150–400)
RBC: 5.07 MIL/uL (ref 3.87–5.11)
RDW: 12.3 % (ref 11.5–15.5)
WBC: 7.7 10*3/uL (ref 4.0–10.5)
nRBC: 0 % (ref 0.0–0.2)

## 2022-07-31 LAB — COMPREHENSIVE METABOLIC PANEL
ALT: 51 U/L — ABNORMAL HIGH (ref 0–44)
AST: 55 U/L — ABNORMAL HIGH (ref 15–41)
Albumin: 4 g/dL (ref 3.5–5.0)
Alkaline Phosphatase: 88 U/L (ref 38–126)
Anion gap: 10 (ref 5–15)
BUN: 18 mg/dL (ref 8–23)
CO2: 27 mmol/L (ref 22–32)
Calcium: 9.4 mg/dL (ref 8.9–10.3)
Chloride: 99 mmol/L (ref 98–111)
Creatinine, Ser: 0.93 mg/dL (ref 0.44–1.00)
GFR, Estimated: >60 mL/min (ref >60)
Glucose, Bld: 235 mg/dL — ABNORMAL HIGH (ref 70–99)
Potassium: 3.8 mmol/L (ref 3.5–5.1)
Sodium: 136 mmol/L (ref 135–145)
Total Bilirubin: 1.1 mg/dL (ref 0.3–1.2)
Total Protein: 7.4 g/dL (ref 6.5–8.1)

## 2022-07-31 NOTE — Progress Notes (Signed)
Hematology/Oncology Consult note [facility]  Telephone:([phone] [phone]  Patient Care Team: Jinny Sanders, MD as PCP - General Magrinat, Virgie Dad, MD (Inactive) as Consulting Physician (Oncology) Fanny Skates, MD as Consulting Physician (General Surgery) Noreene Filbert, MD as Referring Physician (Radiation Oncology) Lloyd Huger, MD as Consulting Physician (Oncology)   Name of the patient: Bridget Cummings  [id number]  10/30/1951   Date of visit: 07/31/22  Diagnosis- invasive mammary carcinoma of the right breast pathological prognostic stage IA pT1 ppN0 cM0, grade 1 ER PR positive HER-2/neu negative status post lumpectomy and adjuvant radiation treatment    Chief complaint/ Reason for visit-follow-up of breast cancer on Arimidex  Heme/Onc history: patient is a 71 year old female who was diagnosed with left breast cancer in November 2008.  Pathology at that time showed a 1.7 cm DCIS that was ER positive.  She underwent external beam radiation and took tamoxifen for 4 years.  Patient found to have stage I invasive mammary carcinoma ER/PR positive HER-2/neu -8 mm grade 1 in the right breast s/p lumpectomy and adjuvant radiation treatment in 2019.  She did not require adjuvant chemotherapy.  She completed adjuvant radiation therapy and started on Arimidex in February 2020.  She is s/p bilateral oophorectomy in 2009 and has an intact uterus.  She started taking Arimidex sometime in February 2019.    Interval history-patient is tolerating Arimidex well.  Denies any breast concerns.  She is compliant with calcium and vitamin D.  ECOG PS- 0 Pain scale- 0   Review of systems- Review of Systems  Constitutional:  Negative for chills, fever, malaise/fatigue and weight loss.  HENT:  Negative for congestion, ear discharge and nosebleeds.   Eyes:  Negative for blurred vision.  Respiratory:  Negative for cough, hemoptysis, sputum  production, shortness of breath and wheezing.   Cardiovascular:  Negative for chest pain, palpitations, orthopnea and claudication.  Gastrointestinal:  Negative for abdominal pain, blood in stool, constipation, diarrhea, heartburn, melena, nausea and vomiting.  Genitourinary:  Negative for dysuria, flank pain, frequency, hematuria and urgency.  Musculoskeletal:  Negative for back pain, joint pain and myalgias.  Skin:  Negative for rash.  Neurological:  Negative for dizziness, tingling, focal weakness, seizures, weakness and headaches.  Endo/Heme/Allergies:  Does not bruise/bleed easily.  Psychiatric/Behavioral:  Negative for depression and suicidal ideas. The patient does not have insomnia.       Allergies  Allergen Reactions   Peppermint Flavor     sneezing     Past Medical History:  Diagnosis Date   Allergy    Breast cancer ([place]) 2008   Left Breast Cancer   Breast cancer ([place]) 2019   Right Breast Cancer   Family history of breast cancer    Hyperlipidemia    Personal history of radiation therapy 2008   Left Breast Cancer   Personal history of radiation therapy 2019   Right Breast Cancer     Past Surgical History:  Procedure Laterality Date   BREAST BIOPSY     BREAST LUMPECTOMY Left 2008   BREAST LUMPECTOMY Right 11/04/2018   BREAST LUMPECTOMY WITH RADIOACTIVE SEED AND SENTINEL LYMPH NODE BIOPSY Right 11/04/2018   Procedure: RIGHT BREAST LUMPECTOMY WITH RADIOACTIVE SEED AND RIGHT AXILLARY SENTINEL LYMPH NODE BIOPSY;  Surgeon: Fanny Skates, MD;  Location: [facility];  Service: General;  Laterality: Right;   FOOT SURGERY     x5 total   OOPHORECTOMY     OVARY REMOVED  2009  TONSILLECTOMY      Social History   Socioeconomic History   Marital status: Married    Spouse name: Not on file   Number of children: 2   Years of education: Not on file   Highest education level: Not on file  Occupational History   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    Employer: [employer]   Tobacco Use   Smoking status: Never   Smokeless tobacco: Never  Vaping Use   Vaping Use: Never used  Substance and Sexual Activity   Alcohol use: No   Drug use: No   Sexual activity: Yes  Other Topics Concern   Not on file  Social History Narrative   Regular exercise-yes, walks 45 minutes every day   Diet: fruit and veggies and chicken avoiding salt   Social Determinants of Health   Financial Resource Strain: Low Risk  (05/22/2022)   Overall Financial Resource Strain (CARDIA)    Difficulty of Paying Living Expenses: Not hard at all  Food Insecurity: No Food Insecurity (05/22/2022)   Hunger Vital Sign    Worried About Running Out of Food in the Last Year: Never true    Ran Out of Food in the Last Year: Never true  Transportation Needs: No Transportation Needs (05/22/2022)   PRAPARE - Hydrologist (Medical): No    Lack of Transportation (Non-Medical): No  Physical Activity: Sufficiently Active (05/22/2022)   Exercise Vital Sign    Days of Exercise per Week: 4 days    Minutes of Exercise per Session: 120 min  Stress: No Stress Concern Present (05/22/2022)   [place]    Feeling of Stress : Not at all  Social Connections: [place] (05/22/2022)   Social Connection and Isolation Panel [NHANES]    Frequency of Communication with Friends and Family: More than three times a week    Frequency of Social Gatherings with Friends and Family: More than three times a week    Attends Religious Services: More than 4 times per year    Active Member of [employer] or Organizations: Yes    Attends Music therapist: More than 4 times per year    Marital Status: Married  Human resources officer Violence: Not At Risk (05/22/2022)   Humiliation, Afraid, Rape, and Kick questionnaire    Fear of Current or Ex-Partner: No    Emotionally Abused: No    Physically Abused: No    Sexually Abused: No     Family History  Problem Relation Age of Onset   Breast cancer Mother 56   Diabetes Father    Breast cancer Sister 50   Breast cancer Maternal Aunt 72   Heart attack Maternal Grandmother 75   Breast cancer Cousin        are dx unk     Current Outpatient Medications:    anastrozole (ARIMIDEX) 1 MG tablet, TAKE 1 TABLET(1 MG) BY MOUTH DAILY, Disp: 90 tablet, Rfl: 3   aspirin 81 MG tablet, Take 81 mg by mouth at bedtime., Disp: , Rfl:    Calcium Carb-Cholecalciferol (CALCIUM + VITAMIN D3 PO), Take 2 tablets by mouth daily., Disp: , Rfl:    hydrochlorothiazide (HYDRODIURIL) 25 MG tablet, TAKE 1 TABLET(25 MG) BY MOUTH DAILY, Disp: 90 tablet, Rfl: 3   Omega-3 Fatty Acids (FISH OIL) 1000 MG CAPS, Take 1 capsule by mouth 1 day or 1 dose., Disp: , Rfl:    polyethylene glycol (MIRALAX / GLYCOLAX)  packet, Take 17 g by mouth once a week., Disp: , Rfl:    pravastatin (PRAVACHOL) 40 MG tablet, TAKE 1 TABLET(40 MG) BY MOUTH DAILY, Disp: 90 tablet, Rfl: 3   vitamin B-12 (CYANOCOBALAMIN) 1000 MCG tablet, Take 1,000 mcg by mouth daily., Disp: , Rfl:   Physical exam:  Vitals:   07/31/22 1256  BP: (!) 145/74  Pulse: 86  Resp: 18  Temp: 99.7 F (37.6 C)  SpO2: 97%  Weight: 186 lb 4.8 oz (84.5 kg)   Physical Exam Constitutional:      General: She is not in acute distress. Cardiovascular:     Rate and Rhythm: Normal rate and regular rhythm.     Heart sounds: Normal heart sounds.  Pulmonary:     Effort: Pulmonary effort is normal.     Breath sounds: Normal breath sounds.  Skin:    General: Skin is warm and dry.  Neurological:     Mental Status: She is alert and oriented to person, place, and time.    Breast exam was performed in seated and lying down position. Patient is status post right lumpectomy with a well-healed surgical scar. No evidence of any palpable masses. No evidence of axillary adenopathy. No evidence of any palpable masses or lumps in the left breast. No evidence of leftt  axillary adenopathy      Latest Ref Rng & Units 07/31/2022   12:41 PM  CMP  Glucose 70 - 99 mg/dL 235   BUN 8 - 23 mg/dL 18   Creatinine 0.44 - 1.00 mg/dL 0.93   Sodium 135 - 145 mmol/L 136   Potassium 3.5 - 5.1 mmol/L 3.8   Chloride 98 - 111 mmol/L 99   CO2 22 - 32 mmol/L 27   Calcium 8.9 - 10.3 mg/dL 9.4   Total Protein 6.5 - 8.1 g/dL 7.4   Total Bilirubin 0.3 - 1.2 mg/dL 1.1   Alkaline Phos 38 - 126 U/L 88   AST 15 - 41 U/L 55   ALT 0 - 44 U/L 51       Latest Ref Rng & Units 07/31/2022   12:41 PM  CBC  WBC 4.0 - 10.5 K/uL 7.7   Hemoglobin 12.0 - 15.0 g/dL 15.7   Hematocrit 36.0 - 46.0 % 45.0   Platelets 150 - 400 K/uL 276     Assessment and plan- Patient is a 71 y.o. female with  invasive mammary carcinoma of the right breast pathological prognostic stage IA pT1 pN0 cM0, grade 1 ER PR positive HER-2/neu negative status post lumpectomy and adjuvant radiation treatment.  She is here for routine follow-up of breast cancer   Patient is tolerating Arimidex well without any significant side effects.  She will be completing 5 years of treatment in February 2024.  Her bone density scan November 2022 showed osteopenia with a T score of -2.3 in her right forearm.  I will plan to get a repeat bone density in 6 months.  Overall her 10-year probability of a major osteoporotic fracture is less than 20% and hip fracture less than 3%.  She does not require any adjuvant bisphosphonates at this time.  Patient will also be due for routine screening mammogram in November 2023 which I will schedule.   Visit Diagnosis 1. Visit for monitoring Arimidex therapy   2. Encounter for follow-up surveillance of breast cancer   3. Osteopenia of right forearm      Dr. Randa Evens, MD, MPH [facility]  [phone] 07/31/2022 3:59 PM

## 2022-08-15 ENCOUNTER — Ambulatory Visit (INDEPENDENT_AMBULATORY_CARE_PROVIDER_SITE_OTHER): Payer: PPO | Admitting: Family Medicine

## 2022-08-15 ENCOUNTER — Encounter: Payer: Self-pay | Admitting: Family Medicine

## 2022-08-15 VITALS — BP 130/80 | HR 92 | Temp 98.3°F | Ht 65.59 in | Wt 185.2 lb

## 2022-08-15 DIAGNOSIS — E785 Hyperlipidemia, unspecified: Secondary | ICD-10-CM | POA: Diagnosis not present

## 2022-08-15 DIAGNOSIS — R739 Hyperglycemia, unspecified: Secondary | ICD-10-CM

## 2022-08-15 DIAGNOSIS — E1169 Type 2 diabetes mellitus with other specified complication: Secondary | ICD-10-CM | POA: Diagnosis not present

## 2022-08-15 DIAGNOSIS — E1159 Type 2 diabetes mellitus with other circulatory complications: Secondary | ICD-10-CM | POA: Diagnosis not present

## 2022-08-15 LAB — POCT GLYCOSYLATED HEMOGLOBIN (HGB A1C): Hemoglobin A1C: 11.4 % — AB (ref 4.0–5.6)

## 2022-08-15 LAB — HM DIABETES FOOT EXAM

## 2022-08-15 MED ORDER — METFORMIN HCL ER 500 MG PO TB24: 500.0000 mg | ORAL_TABLET | Freq: Every day | ORAL | 11 refills | Status: DC

## 2022-08-15 NOTE — Progress Notes (Signed)
Patient ID: Bridget Cummings, female    DOB: [birth year], 71 y.o.   MRN: [id number]  This visit was conducted in person.  BP 130/80   Pulse 92   Temp 98.3 F (36.8 C) (Oral)   Ht 5' 5.59" (1.666 m)   Wt 185 lb 4 oz (84 kg)   SpO2 95%   BMI 30.28 kg/m    CC:  Chief Complaint  Patient presents with   Hyperglycemia    Subjective:   HPI: Bridget Cummings is a 71 y.o. female presenting on 08/15/2022 for Hyperglycemia  She was noted to have several glucose elevations with recent blood work over the last 2 months from 2 13-2 19.  New diagnosis diabetes Lab Results  Component Value Date   HGBA1C 11.4 (A) 08/15/2022   No changes in diet, no weight gain, no new medications, no steroid injections   Father and brother with DM. Wt Readings from Last 3 Encounters:  08/15/22 185 lb 4 oz (84 kg)  07/31/22 186 lb 4.8 oz (84.5 kg)  06/09/22 188 lb 8 oz (85.5 kg)      LDL is not at new goal less than 100 despite pravastatin 40 mg daily.  We briefly discussed changing to atorvastatin or rosuvastatin but she is hesitant and we will reconsider at next office visit. Lab Results  Component Value Date   CHOL 197 06/09/2022   HDL 47.90 06/09/2022   LDLCALC 123 (H) 06/09/2022   LDLDIRECT 140.2 09/30/2013   TRIG 131.0 06/09/2022   CHOLHDL 4 06/09/2022     Relevant past medical, surgical, family and social history reviewed and updated as indicated. Interim medical history since our last visit reviewed. Allergies and medications reviewed and updated. Outpatient Medications Prior to Visit  Medication Sig Dispense Refill   anastrozole (ARIMIDEX) 1 MG tablet TAKE 1 TABLET(1 MG) BY MOUTH DAILY 90 tablet 3   aspirin 81 MG tablet Take 81 mg by mouth at bedtime.     Calcium Carb-Cholecalciferol (CALCIUM + VITAMIN D3 PO) Take 2 tablets by mouth daily.     hydrochlorothiazide (HYDRODIURIL) 25 MG tablet TAKE 1 TABLET(25 MG) BY MOUTH DAILY 90 tablet 3   Omega-3 Fatty Acids (FISH OIL)  1000 MG CAPS Take 1 capsule by mouth 1 day or 1 dose.     polyethylene glycol (MIRALAX / GLYCOLAX) packet Take 17 g by mouth once a week.     pravastatin (PRAVACHOL) 40 MG tablet TAKE 1 TABLET(40 MG) BY MOUTH DAILY 90 tablet 3   vitamin B-12 (CYANOCOBALAMIN) 1000 MCG tablet Take 1,000 mcg by mouth daily.     No facility-administered medications prior to visit.     Per HPI unless specifically indicated in ROS section below Review of Systems  Constitutional:  Negative for fatigue and fever.  HENT:  Negative for congestion.   Eyes:  Negative for pain.  Respiratory:  Negative for cough and shortness of breath.   Cardiovascular:  Negative for chest pain, palpitations and leg swelling.  Gastrointestinal:  Negative for abdominal pain.  Genitourinary:  Negative for dysuria and vaginal bleeding.  Musculoskeletal:  Negative for back pain.  Neurological:  Negative for syncope, light-headedness and headaches.  Psychiatric/Behavioral:  Negative for dysphoric mood.    Objective:  BP 130/80   Pulse 92   Temp 98.3 F (36.8 C) (Oral)   Ht 5' 5.59" (1.666 m)   Wt 185 lb 4 oz (84 kg)   SpO2 95%   BMI 30.28 kg/m  Wt Readings from Last 3 Encounters:  08/15/22 185 lb 4 oz (84 kg)  07/31/22 186 lb 4.8 oz (84.5 kg)  06/09/22 188 lb 8 oz (85.5 kg)      Physical Exam Constitutional:      General: She is not in acute distress.    Appearance: Normal appearance. She is well-developed. She is not ill-appearing or toxic-appearing.  HENT:     Head: Normocephalic.     Right Ear: Hearing, tympanic membrane, ear canal and external ear normal. Tympanic membrane is not erythematous, retracted or bulging.     Left Ear: Hearing, tympanic membrane, ear canal and external ear normal. Tympanic membrane is not erythematous, retracted or bulging.     Nose: No mucosal edema or rhinorrhea.     Right Sinus: No maxillary sinus tenderness or frontal sinus tenderness.     Left Sinus: No maxillary sinus tenderness or  frontal sinus tenderness.     Mouth/Throat:     Pharynx: Uvula midline.  Eyes:     General: Lids are normal. Lids are everted, no foreign bodies appreciated.     Conjunctiva/sclera: Conjunctivae normal.     Pupils: Pupils are equal, round, and reactive to light.  Neck:     Thyroid: No thyroid mass or thyromegaly.     Vascular: No carotid bruit.     Trachea: Trachea normal.  Cardiovascular:     Rate and Rhythm: Normal rate and regular rhythm.     Pulses: Normal pulses.     Heart sounds: Normal heart sounds, S1 normal and S2 normal. No murmur heard.    No friction rub. No gallop.  Pulmonary:     Effort: Pulmonary effort is normal. No tachypnea or respiratory distress.     Breath sounds: Normal breath sounds. No decreased breath sounds, wheezing, rhonchi or rales.  Abdominal:     General: Bowel sounds are normal.     Palpations: Abdomen is soft.     Tenderness: There is no abdominal tenderness.  Musculoskeletal:     Cervical back: Normal range of motion and neck supple.  Skin:    General: Skin is warm and dry.     Findings: No rash.  Neurological:     Mental Status: She is alert.  Psychiatric:        Mood and Affect: Mood is not anxious or depressed.        Speech: Speech normal.        Behavior: Behavior normal. Behavior is cooperative.        Thought Content: Thought content normal.        Judgment: Judgment normal.   Diabetic foot exam: Normal inspection No skin breakdown No calluses  Normal DP pulses Normal sensation to light touch and monofilament Nails normal     Results for orders placed or performed in visit on 08/15/22  POCT glycosylated hemoglobin (Hb A1C)  Result Value Ref Range   Hemoglobin A1C 11.4 (A) 4.0 - 5.6 %   HbA1c POC (<> result, manual entry)     HbA1c, POC (prediabetic range)     HbA1c, POC (controlled diabetic range)       COVID 19 screen:  No recent travel or known exposure to COVID19 The patient denies respiratory symptoms of COVID 19 at  this time. The importance of social distancing was discussed today.   Assessment and Plan Problem List Items Addressed This Visit     Hyperlipidemia LDL goal <130    Chronic, no longer at goal with  new LDL goal less than 100  Continue pravastatin 40 mg p.o. daily for now.  We will recheck at next lab visit in 3 months but if not at goal we can change to atorvastatin or rosuvastatin.      Type 2 diabetes mellitus with other circulatory complications (HCC) - Primary    Acute, new diagnosis No clear secondary cause, likely due to aging and hereditary factors .  Offered referral to nutritionist but patient refused.  She states she knows a lot about diabetes given her strong family history.  If and information on dietary changes, she will work on low carbohydrate diet. Handwritten prescription for glucometer, test strips and lancets.  Reviewed testing and expected values. We will start metformin ER 500 mg p.o. daily.  Reviewed medication use, and side effects.  Recommended eye exam with diabetes evaluation       Relevant Medications   metFORMIN (GLUCOPHAGE-XR) 500 MG 24 hr tablet   Other Visit Diagnoses     Elevated blood sugar       Relevant Orders   POCT glycosylated hemoglobin (Hb A1C) (Completed)          Eliezer Lofts, MD

## 2022-08-15 NOTE — Assessment & Plan Note (Signed)
Acute, new diagnosis No clear secondary cause, likely due to aging and hereditary factors .  Offered referral to nutritionist but patient refused.  She states she knows a lot about diabetes given her strong family history.  If and information on dietary changes, she will work on low carbohydrate diet. Handwritten prescription for glucometer, test strips and lancets.  Reviewed testing and expected values. We will start metformin ER 500 mg p.o. daily.  Reviewed medication use, and side effects.  Recommended eye exam with diabetes evaluation

## 2022-08-15 NOTE — Patient Instructions (Addendum)
Work on low carbohydrate as discussed.  Start metformin XR 500 mg daily.  Check blood sugar at least every few days to daily... fasting goal < 120, 2 hour after a meal goal < 180.   Set up yearly eye exam for diabetes and have the opthalmologist send [place] a copy of the evaluation for the chart.

## 2022-08-15 NOTE — Assessment & Plan Note (Signed)
Chronic, no longer at goal with new LDL goal less than 100  Continue pravastatin 40 mg p.o. daily for now.  We will recheck at next lab visit in 3 months but if not at goal we can change to atorvastatin or rosuvastatin.

## 2022-08-17 ENCOUNTER — Telehealth: Payer: Self-pay | Admitting: Family Medicine

## 2022-08-17 MED ORDER — NIRMATRELVIR/RITONAVIR (PAXLOVID)TABLET: 3.0000 | ORAL_TABLET | Freq: Two times a day (BID) | ORAL | 0 refills | Status: AC

## 2022-08-17 NOTE — Telephone Encounter (Signed)
Patient called in stating she tested positive for covid. She stated she doesn't want to come into the office. Offered her a video visit appointment and patient refused.

## 2022-08-17 NOTE — Telephone Encounter (Signed)
I spoke with pt; pt tested positive for home covid test on 08/16/22. Symptoms started on 08/16/22. Pt started with scratchy S/T on 08/16/22. No swelling in throat and no difficulty swallowing. Last night temp 101 and this morning temp is 100 pt taking tylenol. Pt has stuffy nose and no color to mucus if blows nose. Pt has non productive cough and slight wheezing at night. Advised pt could try cool mist humidifier and elevation of head when laying down.No CP and no SOB; no H/A and no dizziness. Pt does not want VV and is interested in antiviral med to [facility] [address]. Pt said no distress in breathing, pt just returned from an eight day cruise. Self quarantine, drink plenty of fluids, rest, and take Tylenol for fever. UC & ED precautions given and pt voiced understanding. Pt said [facility] would notify her when med sent to pharmacy. Sending note to Dr Diona Browner and Butch Penny CMA.

## 2022-08-17 NOTE — Telephone Encounter (Signed)
Patient with elevated risk given age and body mass index of 30.  On day 2 of illness. GFR greater than 60. Prescription for Paxlovid sent to requested pharmacy.

## 2022-08-17 NOTE — Addendum Note (Signed)
Addended byEliezer Lofts E on: 08/17/2022 01:50 PM   Modules accepted: Orders

## 2022-08-17 NOTE — Telephone Encounter (Signed)
Left message for Ms. Ramnauth that Dr. Diona Browner has sent in a Rx for Paxlovid to her pharmacy.

## 2022-09-11 ENCOUNTER — Telehealth: Payer: Self-pay

## 2022-09-11 DIAGNOSIS — E1159 Type 2 diabetes mellitus with other circulatory complications: Secondary | ICD-10-CM

## 2022-09-11 NOTE — Telephone Encounter (Signed)
Pt called and decided she would like to see a nutritionist for her diabetes. She would like to see one at the [facility] location.

## 2022-09-12 NOTE — Telephone Encounter (Signed)
Ms. Bagsby notified by telephone that Dr. Diona Browner has placed the referral to the nutritionist as requested.

## 2022-09-12 NOTE — Telephone Encounter (Signed)
Let the patient know that referral has been placed.

## 2022-09-16 ENCOUNTER — Other Ambulatory Visit: Payer: Self-pay | Admitting: Oncology

## 2022-10-13 ENCOUNTER — Encounter: Payer: Self-pay | Admitting: *Deleted

## 2022-10-13 ENCOUNTER — Encounter: Payer: PPO | Attending: Family Medicine | Admitting: *Deleted

## 2022-10-13 VITALS — BP 122/78 | Ht 66.0 in | Wt 182.8 lb

## 2022-10-13 DIAGNOSIS — E119 Type 2 diabetes mellitus without complications: Secondary | ICD-10-CM | POA: Diagnosis not present

## 2022-10-13 DIAGNOSIS — Z713 Dietary counseling and surveillance: Secondary | ICD-10-CM | POA: Diagnosis not present

## 2022-10-13 NOTE — Patient Instructions (Addendum)
1315Check blood sugars before breakfast or 2 hours after one meal 3-4 x week Bring blood sugar records to the next appointment  Exercise: Continue water aerobics for    60  minutes   4  days a week  Eat 3 meals day,   1-2  snacks a day Space meals 4-6 hours apart Don't skip meals - eat 1 protein and 1 carbohydrate serving  Complete 3 Day Food Record and bring to next appt  Return for appointment on:

## 2022-10-13 NOTE — Progress Notes (Signed)
Diabetes Self-Management Education  Visit Type: First/Initial  Appt. Start Time: 1315 Appt. End Time: 1610  10/13/2022  [name], identified by name and date of birth, is a 71 y.o. female with a diagnosis of Diabetes: Type 2.   ASSESSMENT  Blood pressure 122/78, height '5\' 6"'$  (1.676 m), weight 182 lb 12.8 oz (82.9 kg). Body mass index is 29.5 kg/m.   Diabetes Self-Management Education - 10/13/22 1504       Visit Information   Visit Type First/Initial      Initial Visit   Diabetes Type Type 2    Date Diagnosed 2 months    Are you currently following a meal plan? Yes    What type of meal plan do you follow? "eating brown rice, cauliflower mashed potatoes, chicken"    Are you taking your medications as prescribed? Yes      Health Coping   How would you rate your overall health? Good      Psychosocial Assessment   Patient Belief/Attitude about Diabetes Other (comment)   "depressed"   What is the hardest part about your diabetes right now, causing you the most concern, or is the most worrisome to you about your diabetes?   Making healty food and beverage choices    Self-care barriers None    Self-management support Doctor's office;Family    Patient Concerns Nutrition/Meal planning;Glycemic Control;Monitoring    Special Needs None    Preferred Learning Style Visual;Hands on;Other (comment)   talking/discussion   Learning Readiness Change in progress    How often do you need to have someone help you when you read instructions, pamphlets, or other written materials from your doctor or pharmacy? 1 - Never    What is the last grade level you completed in school? high school      Pre-Education Assessment   Patient understands the diabetes disease and treatment process. Needs Instruction    Patient understands incorporating nutritional management into lifestyle. Needs Instruction    Patient undertands incorporating physical activity into lifestyle. Comprehends key  points    Patient understands using medications safely. Needs Instruction    Patient understands monitoring blood glucose, interpreting and using results Needs Instruction    Patient understands prevention, detection, and treatment of acute complications. Needs Instruction    Patient understands prevention, detection, and treatment of chronic complications. Needs Instruction    Patient understands how to develop strategies to address psychosocial issues. Needs Instruction    Patient understands how to develop strategies to promote health/change behavior. Needs Instruction      Complications   Last HgB A1C per patient/outside source 11.4 %   08/15/2022   How often do you check your blood sugar? 0 times/day (not testing)   Pt reports that she is having trouble getting blood for her fingersticks. She brought her glucometer - One Touch Verio Reflect and was instructed on use. BG upon return demonstration was 140 mg/dL at 1:40 pm - 3 hrs pp.   Have you had a dilated eye exam in the past 12 months? Yes    Have you had a dental exam in the past 12 months? Yes    Are you checking your feet? Yes    How many days per week are you checking your feet? 7      Dietary Intake   Breakfast skips or eats cheese and crackers when she returns from exercise    Snack (morning) reports 2 snacks/day - SF mints, chips, peanuts  Lunch 1/2 [place] and cuccumber sandwich and 6 chips on keto bread    Dinner chicken, occasional beef; potatoes, corn, green beans, rice, pasta, broccoli, cuccumbers, squash, zucchini, okra    Beverage(s) water, diet soda      Activity / Exercise   Activity / Exercise Type Moderate (swimming / aerobic walking)    How many days per week do you exercise? 4    How many minutes per day do you exercise? 60    Total minutes per week of exercise 240      Patient Education   Previous Diabetes Education No    Disease Pathophysiology Definition of diabetes, type 1 and 2, and the diagnosis of  diabetes;Factors that contribute to the development of diabetes;Explored patient's options for treatment of their diabetes    Healthy Eating Role of diet in the treatment of diabetes and the relationship between the three main macronutrients and blood glucose level;Food label reading, portion sizes and measuring food.;Reviewed blood glucose goals for pre and post meals and how to evaluate the patients' food intake on their blood glucose level.;Meal timing in regards to the patients' current diabetes medication.    Being Active Role of exercise on diabetes management, blood pressure control and cardiac health.    Medications Reviewed patients medication for diabetes, action, purpose, timing of dose and side effects.    Monitoring Taught/evaluated SMBG meter.;Purpose and frequency of SMBG.;Taught/discussed recording of test results and interpretation of SMBG.;Identified appropriate SMBG and/or A1C goals.    Chronic complications Relationship between chronic complications and blood glucose control    Diabetes Stress and Support Identified and addressed patients feelings and concerns about diabetes      Individualized Goals (developed by patient)   Reducing Risk Other (comment)   improve blood sugars, prevent diabetes complications     Outcomes   Expected Outcomes Demonstrated interest in learning. Expect positive outcomes    Future DMSE 4-6 wks         Individualized Plan for Diabetes Self-Management Training:   Learning Objective:  Patient will have a greater understanding of diabetes self-management. Patient education plan is to attend individual and/or group sessions per assessed needs and concerns.   Plan:   Patient Instructions  Check blood sugars before breakfast or 2 hours after one meal 3-4 x week Bring blood sugar records to the next appointment  Exercise: Continue water aerobics for    60  minutes   4  days a week  Eat 3 meals day,   1-2  snacks a day Space meals 4-6 hours  apart Don't skip meals - eat 1 protein and 1 carbohydrate serving  Complete 3 Day Food Record and bring to next appt  Return for appointment on:    Expected Outcomes:  Demonstrated interest in learning. Expect positive outcomes  Education material provided:  General Meal Planning Guidelines Simple Meal Plan 3 Day Food Record  If problems or questions, patient to contact team via:   Johny Drilling, RN, [place] [phone]  Future DSME appointment: 4-6 wks November 23, 2022 with this educator

## 2022-10-18 ENCOUNTER — Ambulatory Visit
Admission: RE | Admit: 2022-10-18 | Discharge: 2022-10-18 | Disposition: A | Discharge status: Home / Self Care | Payer: PPO | Source: Ambulatory Visit | Attending: Oncology | Admitting: Oncology

## 2022-10-18 DIAGNOSIS — Z08 Encounter for follow-up examination after completed treatment for malignant neoplasm: Secondary | ICD-10-CM | POA: Insufficient documentation

## 2022-10-18 DIAGNOSIS — Z1231 Encounter for screening mammogram for malignant neoplasm of breast: Secondary | ICD-10-CM | POA: Diagnosis not present

## 2022-10-18 DIAGNOSIS — I2510 Atherosclerotic heart disease of native coronary artery without angina pectoris: Secondary | ICD-10-CM | POA: Diagnosis not present

## 2022-10-18 DIAGNOSIS — Z853 Personal history of malignant neoplasm of breast: Secondary | ICD-10-CM | POA: Diagnosis not present

## 2022-11-06 ENCOUNTER — Other Ambulatory Visit: Payer: PPO

## 2022-11-06 ENCOUNTER — Telehealth: Payer: Self-pay | Admitting: Family Medicine

## 2022-11-06 DIAGNOSIS — E1159 Type 2 diabetes mellitus with other circulatory complications: Secondary | ICD-10-CM

## 2022-11-06 NOTE — Telephone Encounter (Signed)
-----   Message from Velna Hatchet, RT sent at 10/23/2022 11:27 AM EST ----- Regarding: Mon 12/4 lab Lab orders needed for appt on 11/06/22.  Thanks, Anda Kraft

## 2022-11-14 ENCOUNTER — Telehealth: Payer: Self-pay | Admitting: *Deleted

## 2022-11-14 ENCOUNTER — Ambulatory Visit (INDEPENDENT_AMBULATORY_CARE_PROVIDER_SITE_OTHER): Payer: PPO | Admitting: Family Medicine

## 2022-11-14 ENCOUNTER — Encounter: Payer: Self-pay | Admitting: Family Medicine

## 2022-11-14 ENCOUNTER — Ambulatory Visit: Payer: PPO | Admitting: *Deleted

## 2022-11-14 VITALS — BP 140/84 | HR 83 | Temp 98.3°F | Ht 65.59 in | Wt 179.0 lb

## 2022-11-14 DIAGNOSIS — E785 Hyperlipidemia, unspecified: Secondary | ICD-10-CM

## 2022-11-14 DIAGNOSIS — I10 Essential (primary) hypertension: Secondary | ICD-10-CM | POA: Diagnosis not present

## 2022-11-14 DIAGNOSIS — E1159 Type 2 diabetes mellitus with other circulatory complications: Secondary | ICD-10-CM | POA: Diagnosis not present

## 2022-11-14 LAB — POCT GLYCOSYLATED HEMOGLOBIN (HGB A1C): Hemoglobin A1C: 7.1 % — AB (ref 4.0–5.6)

## 2022-11-14 LAB — COMPREHENSIVE METABOLIC PANEL
ALT: 31 U/L (ref 0–35)
AST: 26 U/L (ref 0–37)
Albumin: 4.5 g/dL (ref 3.5–5.2)
Alkaline Phosphatase: 62 U/L (ref 39–117)
BUN: 23 mg/dL (ref 6–23)
CO2: 32 mEq/L (ref 19–32)
Calcium: 9.7 mg/dL (ref 8.4–10.5)
Chloride: 101 mEq/L (ref 96–112)
Creatinine, Ser: 1.02 mg/dL (ref 0.40–1.20)
GFR: 55.78 mL/min — ABNORMAL LOW (ref >60.00)
Glucose, Bld: 130 mg/dL — ABNORMAL HIGH (ref 70–99)
Potassium: 4.2 mEq/L (ref 3.5–5.1)
Sodium: 142 mEq/L (ref 135–145)
Total Bilirubin: 0.8 mg/dL (ref 0.2–1.2)
Total Protein: 7 g/dL (ref 6.0–8.3)

## 2022-11-14 LAB — LIPID PANEL
Cholesterol: 197 mg/dL (ref 0–200)
HDL: 52 mg/dL (ref >39.00)
LDL Cholesterol: 116 mg/dL — ABNORMAL HIGH (ref 0–99)
NonHDL: 144.56
Total CHOL/HDL Ratio: 4
Triglycerides: 143 mg/dL (ref 0.0–149.0)
VLDL: 28.6 mg/dL (ref 0.0–40.0)

## 2022-11-14 LAB — MICROALBUMIN / CREATININE URINE RATIO
Creatinine,U: 349.1 mg/dL
Microalb Creat Ratio: 0.4 mg/g (ref 0.0–30.0)
Microalb, Ur: 1.5 mg/dL (ref 0.0–1.9)

## 2022-11-14 NOTE — Assessment & Plan Note (Signed)
Borderline control today chronic.  Continue current medication.  HCTZ 25 mg daily.

## 2022-11-14 NOTE — Telephone Encounter (Signed)
Pt didn't show for appointment today. Left message on voice mail to call back and reschedule.

## 2022-11-14 NOTE — Assessment & Plan Note (Signed)
Due for re-evaluation on 40 mg daily pravastatin.

## 2022-11-14 NOTE — Assessment & Plan Note (Signed)
Chronic, improved control with diet changes and metformin start.  Given not ideal control will increase metformin further to 500 mg XL TWO tablets daily re-eval in 3 months.  Due for urine microalbumin.Marland Kitchen

## 2022-11-14 NOTE — Patient Instructions (Addendum)
Please stop at the lab to have labs drawn.  Increase Metformin to 2 tablets daily.. can space the dose if stomach upset.  Keep working on low carb diet and regular exercise.

## 2022-11-14 NOTE — Progress Notes (Signed)
Patient ID: Bridget Cummings, female    DOB: 1951/02/27, 71 y.o.   MRN: [id number]  This visit was conducted in person.  BP (!) 140/84   Pulse 83   Temp 98.3 F (36.8 C) (Oral)   Ht 5' 5.59" (1.666 m)   Wt 179 lb (81.2 kg)   SpO2 97%   BMI 29.25 kg/m    CC:  Chief Complaint  Patient presents with   Diabetes    Subjective:   HPI: Bridget Cummings is a 71 y.o. female presenting on 11/14/2022 for Diabetes   Diabetes:  Improved with start of metformin 500 mg XR daily.  She has been working on low carb diet. Lab Results  Component Value Date   HGBA1C 7.1 (A) 11/14/2022  Using medications without difficulties: none Hypoglycemic episodes:none Hyperglycemic episodes: none Feet problems: no ulcers Blood Sugars averaging:  FBS 108-130 eye exam within last year: due.. scheduled.  At last check LDL not at goal on pravastatin 20 mg... increase to 40 mg     Relevant past medical, surgical, family and social history reviewed and updated as indicated. Interim medical history since our last visit reviewed. Allergies and medications reviewed and updated. Outpatient Medications Prior to Visit  Medication Sig Dispense Refill   anastrozole (ARIMIDEX) 1 MG tablet TAKE 1 TABLET(1 MG) BY MOUTH DAILY 90 tablet 3   aspirin 81 MG tablet Take 81 mg by mouth at bedtime.     Blood Glucose Monitoring Suppl (ONETOUCH VERIO REFLECT) w/Device KIT USE TO CHECK BLOOD SUGAR AS DIRECTED.     Calcium Carb-Cholecalciferol (CALCIUM + VITAMIN D3 PO) Take 2 tablets by mouth daily.     hydrochlorothiazide (HYDRODIURIL) 25 MG tablet TAKE 1 TABLET(25 MG) BY MOUTH DAILY 90 tablet 3   Lancets (ONETOUCH DELICA PLUS [id number]) MISC Apply topically daily.     metFORMIN (GLUCOPHAGE-XR) 500 MG 24 hr tablet Take 1 tablet (500 mg total) by mouth daily with breakfast. 30 tablet 11   Omega-3 Fatty Acids (FISH OIL) 1000 MG CAPS Take 2 capsules by mouth daily.     ONETOUCH VERIO test strip daily.      polyethylene glycol (MIRALAX / GLYCOLAX) packet Take 17 g by mouth once a week.     pravastatin (PRAVACHOL) 40 MG tablet TAKE 1 TABLET(40 MG) BY MOUTH DAILY 90 tablet 3   vitamin B-12 (CYANOCOBALAMIN) 1000 MCG tablet Take 1,000 mcg by mouth daily.     No facility-administered medications prior to visit.     Per HPI unless specifically indicated in ROS section below Review of Systems  Constitutional:  Negative for fatigue and fever.  HENT:  Negative for congestion.   Eyes:  Negative for pain.  Respiratory:  Negative for cough and shortness of breath.   Cardiovascular:  Negative for chest pain, palpitations and leg swelling.  Gastrointestinal:  Negative for abdominal pain.  Genitourinary:  Negative for dysuria and vaginal bleeding.  Musculoskeletal:  Negative for back pain.  Neurological:  Negative for syncope, light-headedness and headaches.  Psychiatric/Behavioral:  Negative for dysphoric mood.    Objective:  BP (!) 140/84   Pulse 83   Temp 98.3 F (36.8 C) (Oral)   Ht 5' 5.59" (1.666 m)   Wt 179 lb (81.2 kg)   SpO2 97%   BMI 29.25 kg/m   Wt Readings from Last 3 Encounters:  11/14/22 179 lb (81.2 kg)  10/13/22 182 lb 12.8 oz (82.9 kg)  08/15/22 185 lb 4 oz (84 kg)  Physical Exam Constitutional:      General: She is not in acute distress.    Appearance: Normal appearance. She is well-developed. She is not ill-appearing or toxic-appearing.  HENT:     Head: Normocephalic.     Right Ear: Hearing, tympanic membrane, ear canal and external ear normal. Tympanic membrane is not erythematous, retracted or bulging.     Left Ear: Hearing, tympanic membrane, ear canal and external ear normal. Tympanic membrane is not erythematous, retracted or bulging.     Nose: No mucosal edema or rhinorrhea.     Right Sinus: No maxillary sinus tenderness or frontal sinus tenderness.     Left Sinus: No maxillary sinus tenderness or frontal sinus tenderness.     Mouth/Throat:     Pharynx:  Uvula midline.  Eyes:     General: Lids are normal. Lids are everted, no foreign bodies appreciated.     Conjunctiva/sclera: Conjunctivae normal.     Pupils: Pupils are equal, round, and reactive to light.  Neck:     Thyroid: No thyroid mass or thyromegaly.     Vascular: No carotid bruit.     Trachea: Trachea normal.  Cardiovascular:     Rate and Rhythm: Normal rate and regular rhythm.     Pulses: Normal pulses.     Heart sounds: Normal heart sounds, S1 normal and S2 normal. No murmur heard.    No friction rub. No gallop.  Pulmonary:     Effort: Pulmonary effort is normal. No tachypnea or respiratory distress.     Breath sounds: Normal breath sounds. No decreased breath sounds, wheezing, rhonchi or rales.  Abdominal:     General: Bowel sounds are normal.     Palpations: Abdomen is soft.     Tenderness: There is no abdominal tenderness.  Musculoskeletal:     Cervical back: Normal range of motion and neck supple.  Skin:    General: Skin is warm and dry.     Findings: No rash.  Neurological:     Mental Status: She is alert.  Psychiatric:        Mood and Affect: Mood is not anxious or depressed.        Speech: Speech normal.        Behavior: Behavior normal. Behavior is cooperative.        Thought Content: Thought content normal.        Judgment: Judgment normal.       Results for orders placed or performed in visit on 11/14/22  POCT glycosylated hemoglobin (Hb A1C)  Result Value Ref Range   Hemoglobin A1C 7.1 (A) 4.0 - 5.6 %   HbA1c POC (<> result, manual entry)     HbA1c, POC (prediabetic range)     HbA1c, POC (controlled diabetic range)     BP Readings from Last 3 Encounters:  11/14/22 (!) 140/84  10/13/22 122/78  08/15/22 130/80     COVID 19 screen:  No recent travel or known exposure to [place] The patient denies respiratory symptoms of COVID 19 at this time. The importance of social distancing was discussed today.   Assessment and Plan Problem List Items  Addressed This Visit     Essential hypertension - Primary    Borderline control today chronic.  Continue current medication.  HCTZ 25 mg daily.      Hyperlipidemia LDL goal <130    Due for re-evaluation on 40 mg daily pravastatin.       Type 2 diabetes mellitus with other circulatory  complications (HCC)    Chronic, improved control with diet changes and metformin start.  Given not ideal control will increase metformin further to 500 mg XL TWO tablets daily re-eval in 3 months.  Due for urine microalbumin..      Relevant Orders   POCT glycosylated hemoglobin (Hb A1C) (Completed)       Eliezer Lofts, MD

## 2022-11-20 ENCOUNTER — Telehealth: Payer: Self-pay | Admitting: Family Medicine

## 2022-11-20 MED ORDER — ATORVASTATIN CALCIUM 20 MG PO TABS: 20.0000 mg | ORAL_TABLET | Freq: Every day | ORAL | 3 refills | Status: DC

## 2022-11-20 NOTE — Telephone Encounter (Signed)
Patient called in stating that she is agreeable with changing her medication pravastatin (PRAVACHOL) 40 MG tablet ,per Dr Arley Phenix lab notes

## 2022-11-20 NOTE — Telephone Encounter (Signed)
Atorvastain 20 mg daily sent in, D/C'd pravastatin

## 2022-11-23 ENCOUNTER — Ambulatory Visit: Payer: PPO | Admitting: *Deleted

## 2022-12-21 ENCOUNTER — Encounter: Payer: Self-pay | Admitting: *Deleted

## 2022-12-21 ENCOUNTER — Encounter: Payer: PPO | Attending: Family Medicine | Admitting: *Deleted

## 2022-12-21 VITALS — BP 116/78 | Wt 179.9 lb

## 2022-12-21 DIAGNOSIS — E119 Type 2 diabetes mellitus without complications: Secondary | ICD-10-CM | POA: Diagnosis not present

## 2022-12-21 NOTE — Patient Instructions (Signed)
Check blood sugars before breakfast or 2 hrs after supper 3 x week  Exercise: Continue water aerobics for    60  minutes   4  days a week  Eat 3 meals day,   1-2  snacks a day Space meals 4-6 hours apart Continue not skipping meals and decreasing portion sizes

## 2022-12-21 NOTE — Progress Notes (Signed)
Diabetes Self-Management Education  Visit Type: Follow-up  Appt. Start Time: 1335 Appt. End Time: 2353  12/21/2022  [name], identified by name and date of birth, is a 72 y.o. female with a diagnosis of Diabetes: Type 2   ASSESSMENT  Blood pressure 116/78, weight 179 lb 14.4 oz (81.6 kg). Body mass index is 29.4 kg/m.   Diabetes Self-Management Education - 12/21/22 1524       Visit Information   Visit Type Follow-up      Complications   Last HgB A1C per patient/outside source 7.1 %   11/14/2022   How often do you check your blood sugar? 3-4 times / week    Fasting Blood glucose range (mg/dL) 130-179   She reports last FBG's were 130's mg/dL.   Have you had a dilated eye exam in the past 12 months? Yes    Have you had a dental exam in the past 12 months? Yes    Are you checking your feet? Yes    How many days per week are you checking your feet? 7      Dietary Intake   Breakfast 3 meals and 1-2 snacks/day - reports eating less portions    Beverage(s) drinking mostly water - diet soda weekly      Activity / Exercise   Activity / Exercise Type Moderate (swimming / aerobic walking)   water aerobics   How many days per week do you exercise? 4    How many minutes per day do you exercise? 60    Total minutes per week of exercise 240      Patient Education   Disease Pathophysiology Definition of diabetes, type 1 and 2, and the diagnosis of diabetes;Factors that contribute to the development of diabetes;Explored patient's options for treatment of their diabetes    Healthy Eating Role of diet in the treatment of diabetes and the relationship between the three main macronutrients and blood glucose level;Food label reading, portion sizes and measuring food.;Plate Method;Reviewed blood glucose goals for pre and post meals and how to evaluate the patients' food intake on their blood glucose level.;Meal timing in regards to the patients' current diabetes medication.     Being Active Role of exercise on diabetes management, blood pressure control and cardiac health.    Medications Reviewed patients medication for diabetes, action, purpose, timing of dose and side effects.    Monitoring Purpose and frequency of SMBG.;Taught/discussed recording of test results and interpretation of SMBG.;Identified appropriate SMBG and/or A1C goals.    Chronic complications Relationship between chronic complications and blood glucose control    Diabetes Stress and Support Identified and addressed patients feelings and concerns about diabetes      Individualized Goals (developed by patient)   Nutrition Follow meal plan discussed    Physical Activity Exercise 3-5 times per week;60 minutes per day    Monitoring  Test my blood glucose as discussed    Reducing Risk examine blood glucose patterns      Post-Education Assessment   Patient understands the diabetes disease and treatment process. Demonstrates understanding / competency    Patient understands incorporating nutritional management into lifestyle. Comprehends key points    Patient undertands incorporating physical activity into lifestyle. Demonstrates understanding / competency    Patient understands using medications safely. Demonstrates understanding / competency    Patient understands monitoring blood glucose, interpreting and using results Comprehends key points    Patient understands prevention, detection, and treatment of acute complications. N/A  Patient understands prevention, detection, and treatment of chronic complications. Demonstrates understanding / competency    Patient understands how to develop strategies to address psychosocial issues. Demonstrates understanding / competency    Patient understands how to develop strategies to promote health/change behavior. Demonstrates understanding / competency      Outcomes   Expected Outcomes Demonstrated interest in learning. Expect positive outcomes    Program  Status Completed      Subsequent Visit   Since your last visit have you continued or begun to take your medications as prescribed? Yes    Since your last visit have you had your blood pressure checked? Yes    Is your most recent blood pressure lower, unchanged, or higher since your last visit? Lower    Since your last visit have you experienced any weight changes? Loss    Weight Loss (lbs) 2.9    Since your last visit, are you checking your blood glucose at least once a day? N/A   checking blood sugar 1 x week - reports difficulty getting blood        Individualized Plan for Diabetes Self-Management Training:   Learning Objective:  Patient will have a greater understanding of diabetes self-management. Patient education plan is to attend individual and/or group sessions per assessed needs and concerns.   Plan:   Patient Instructions  Check blood sugars before breakfast or 2 hrs after supper 3 x week  Exercise: Continue water aerobics for    60  minutes   4  days a week  Eat 3 meals day,   1-2  snacks a day Space meals 4-6 hours apart Continue not skipping meals and decreasing portion sizes  Expected Outcomes:  Demonstrated interest in learning. Expect positive outcomes  Education material provided:  Planning a Balanced Meal Healthy Snack Choices (ADA) Diabetes Plate Method (ADA)  If problems or questions, patient to contact team via:   Johny Drilling, RN, [place], [place] [phone]  Future DSME appointment: PRN

## 2023-01-01 ENCOUNTER — Telehealth: Payer: Self-pay | Admitting: Family Medicine

## 2023-01-01 MED ORDER — METFORMIN HCL ER 500 MG PO TB24: 500.0000 mg | ORAL_TABLET | Freq: Two times a day (BID) | ORAL | 1 refills | Status: DC

## 2023-01-01 MED ORDER — METFORMIN HCL ER 500 MG PO TB24: 1000.0000 mg | ORAL_TABLET | Freq: Every day | ORAL | 1 refills | Status: DC

## 2023-01-01 NOTE — Telephone Encounter (Signed)
New Rx for Metformin XR 500 mg to take 2 tablets po daily with breakfast sent to [facility] on [address].  Bridget Cummings notified of this via telephone.

## 2023-01-01 NOTE — Telephone Encounter (Signed)
Patient states that pharmacy will not refill rx metFORMIN (GLUCOPHAGE-XR) 500 MG 24 hr tablet because she has just received a refill in December. She was advised to start taking 1000 mgh ,the pharmacy needs a new rx for the 1000 mg. She is now on her last pill.

## 2023-01-04 ENCOUNTER — Encounter: Payer: Self-pay | Admitting: Radiology

## 2023-01-04 ENCOUNTER — Ambulatory Visit
Admission: RE | Admit: 2023-01-04 | Discharge: 2023-01-04 | Disposition: A | Discharge status: Home / Self Care | Payer: PPO | Source: Ambulatory Visit | Attending: Oncology | Admitting: Oncology

## 2023-01-04 DIAGNOSIS — M85831 Other specified disorders of bone density and structure, right forearm: Secondary | ICD-10-CM | POA: Diagnosis not present

## 2023-01-04 DIAGNOSIS — Z853 Personal history of malignant neoplasm of breast: Secondary | ICD-10-CM | POA: Diagnosis not present

## 2023-01-04 DIAGNOSIS — Z780 Asymptomatic menopausal state: Secondary | ICD-10-CM | POA: Diagnosis not present

## 2023-01-04 DIAGNOSIS — Z08 Encounter for follow-up examination after completed treatment for malignant neoplasm: Secondary | ICD-10-CM | POA: Diagnosis not present

## 2023-01-04 DIAGNOSIS — Z13820 Encounter for screening for osteoporosis: Secondary | ICD-10-CM | POA: Insufficient documentation

## 2023-01-31 ENCOUNTER — Encounter: Payer: Self-pay | Admitting: Oncology

## 2023-01-31 ENCOUNTER — Inpatient Hospital Stay: Payer: PPO | Attending: Oncology | Admitting: Oncology

## 2023-01-31 VITALS — BP 137/64 | HR 83 | Temp 99.6°F | Resp 18 | Ht 66.0 in | Wt 179.1 lb

## 2023-01-31 DIAGNOSIS — Z170 Estrogen receptor positive status [ER+]: Secondary | ICD-10-CM

## 2023-01-31 DIAGNOSIS — Z79899 Other long term (current) drug therapy: Secondary | ICD-10-CM | POA: Insufficient documentation

## 2023-01-31 DIAGNOSIS — Z08 Encounter for follow-up examination after completed treatment for malignant neoplasm: Secondary | ICD-10-CM

## 2023-01-31 DIAGNOSIS — C50911 Malignant neoplasm of unspecified site of right female breast: Secondary | ICD-10-CM

## 2023-01-31 DIAGNOSIS — Z79811 Long term (current) use of aromatase inhibitors: Secondary | ICD-10-CM

## 2023-01-31 DIAGNOSIS — Z5181 Encounter for therapeutic drug level monitoring: Secondary | ICD-10-CM

## 2023-01-31 DIAGNOSIS — M85831 Other specified disorders of bone density and structure, right forearm: Secondary | ICD-10-CM

## 2023-01-31 DIAGNOSIS — Z923 Personal history of irradiation: Secondary | ICD-10-CM | POA: Diagnosis not present

## 2023-01-31 DIAGNOSIS — Z7982 Long term (current) use of aspirin: Secondary | ICD-10-CM | POA: Insufficient documentation

## 2023-01-31 DIAGNOSIS — Z90722 Acquired absence of ovaries, bilateral: Secondary | ICD-10-CM | POA: Insufficient documentation

## 2023-01-31 NOTE — Progress Notes (Signed)
No concerns for the provider.

## 2023-01-31 NOTE — Progress Notes (Signed)
Hematology/Oncology Consult note [facility]  [phone] [phone]  Patient Care Team: Jinny Sanders, MD as PCP - General Magrinat, Virgie Dad, MD (Inactive) as Consulting Physician (Oncology) Fanny Skates, MD as Consulting Physician (General Surgery) Noreene Filbert, MD as Referring Physician (Radiation Oncology) Lloyd Huger, MD as Consulting Physician (Oncology)   Name of the patient: Bridget Cummings  [id number]  1951-03-07   Date of visit: 01/31/23  Diagnosis-  invasive mammary carcinoma of the right breast pathological prognostic stage IA pT1 ppN0 cM0, grade 1 ER PR positive HER-2/neu negative status post lumpectomy and adjuvant radiation treatment    Chief complaint/ Reason for visit- routine f/u of breast cancer  Heme/Onc history: patient is a 72 year old female who was diagnosed with left breast cancer in November 2008.  Pathology at that time showed a 1.7 cm DCIS that was ER positive.  She underwent external beam radiation and took tamoxifen for 4 years.  Patient found to have stage I invasive mammary carcinoma ER/PR positive HER-2/neu -8 mm grade 1 in the right breast s/p lumpectomy and adjuvant radiation treatment in 2019.  She did not require adjuvant chemotherapy.  She completed adjuvant radiation therapy and started on Arimidex in February 2020.  She is s/p bilateral oophorectomy in 2009 and has an intact uterus.   Interval history-she is tolerating Arimidex well without any significant side effects.  She is also on calcium and vitamin D.  Denies any breast concerns or new aches and pains anywhere  ECOG PS- 0 Pain scale- 0   Review of systems- Review of Systems  Constitutional:  Negative for chills, fever, malaise/fatigue and weight loss.  HENT:  Negative for congestion, ear discharge and nosebleeds.   Eyes:  Negative for blurred vision.  Respiratory:  Negative for cough, hemoptysis, sputum production,  shortness of breath and wheezing.   Cardiovascular:  Negative for chest pain, palpitations, orthopnea and claudication.  Gastrointestinal:  Negative for abdominal pain, blood in stool, constipation, diarrhea, heartburn, melena, nausea and vomiting.  Genitourinary:  Negative for dysuria, flank pain, frequency, hematuria and urgency.  Musculoskeletal:  Negative for back pain, joint pain and myalgias.  Skin:  Negative for rash.  Neurological:  Negative for dizziness, tingling, focal weakness, seizures, weakness and headaches.  Endo/Heme/Allergies:  Does not bruise/bleed easily.  Psychiatric/Behavioral:  Negative for depression and suicidal ideas. The patient does not have insomnia.       Allergies  Allergen Reactions   Peppermint Flavor     sneezing     Past Medical History:  Diagnosis Date   Allergy    Breast cancer ([place]) 2008   Left Breast Cancer   Breast cancer ([place]) 2019   Right Breast Cancer   Diabetes mellitus without complication ([place])    Family history of breast cancer    Hyperlipidemia    Personal history of radiation therapy 2008   Left Breast Cancer   Personal history of radiation therapy 2019   Right Breast Cancer     Past Surgical History:  Procedure Laterality Date   BREAST BIOPSY     BREAST LUMPECTOMY Left 2008   BREAST LUMPECTOMY Right 11/04/2018   BREAST LUMPECTOMY WITH RADIOACTIVE SEED AND SENTINEL LYMPH NODE BIOPSY Right 11/04/2018   Procedure: RIGHT BREAST LUMPECTOMY WITH RADIOACTIVE SEED AND RIGHT AXILLARY SENTINEL LYMPH NODE BIOPSY;  Surgeon: Fanny Skates, MD;  Location: [place];  Service: General;  Laterality: Right;   FOOT SURGERY     x5 total  OOPHORECTOMY     OVARY REMOVED  2009   TONSILLECTOMY      Social History   Socioeconomic History   Marital status: Married    Spouse name: Not on file   Number of children: 2   Years of education: Not on file   Highest education level: Not on file  Occupational History   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     Employer: [employer]  Tobacco Use   Smoking status: Never   Smokeless tobacco: Never  Vaping Use   Vaping Use: Never used  Substance and Sexual Activity   Alcohol use: No   Drug use: No   Sexual activity: Yes  Other Topics Concern   Not on file  Social History Narrative   Regular exercise-yes, walks 45 minutes every day   Diet: fruit and veggies and chicken avoiding salt   Social Determinants of Health   Financial Resource Strain: Low Risk  (05/22/2022)   Overall Financial Resource Strain (CARDIA)    Difficulty of Paying Living Expenses: Not hard at all  Food Insecurity: No Food Insecurity (05/22/2022)   Hunger Vital Sign    Worried About Running Out of Food in the Last Year: Never true    Ran Out of Food in the Last Year: Never true  Transportation Needs: No Transportation Needs (05/22/2022)   PRAPARE - Hydrologist (Medical): No    Lack of Transportation (Non-Medical): No  Physical Activity: Sufficiently Active (05/22/2022)   Exercise Vital Sign    Days of Exercise per Week: 4 days    Minutes of Exercise per Session: 120 min  Stress: No Stress Concern Present (05/22/2022)   [place]    Feeling of Stress : Not at all  Social Connections: [place] (05/22/2022)   Social Connection and Isolation Panel [NHANES]    Frequency of Communication with Friends and Family: More than three times a week    Frequency of Social Gatherings with Friends and Family: More than three times a week    Attends Religious Services: More than 4 times per year    Active Member of [employer] or Organizations: Yes    Attends Music therapist: More than 4 times per year    Marital Status: Married  Human resources officer Violence: Not At Risk (05/22/2022)   Humiliation, Afraid, Rape, and Kick questionnaire    Fear of Current or Ex-Partner: No    Emotionally Abused: No    Physically  Abused: No    Sexually Abused: No    Family History  Problem Relation Age of Onset   Breast cancer Mother 22   Diabetes Father    Breast cancer Sister 40   Diabetes Brother    Breast cancer Maternal Aunt 72   Heart attack Maternal Grandmother 75   Breast cancer Cousin        are dx unk     Current Outpatient Medications:    anastrozole (ARIMIDEX) 1 MG tablet, TAKE 1 TABLET(1 MG) BY MOUTH DAILY, Disp: 90 tablet, Rfl: 3   aspirin 81 MG tablet, Take 81 mg by mouth at bedtime., Disp: , Rfl:    atorvastatin (LIPITOR) 20 MG tablet, Take 1 tablet (20 mg total) by mouth daily., Disp: 90 tablet, Rfl: 3   Blood Glucose Monitoring Suppl (ONETOUCH VERIO REFLECT) w/Device KIT, USE TO CHECK BLOOD SUGAR AS DIRECTED., Disp: , Rfl:    Calcium Carb-Cholecalciferol (CALCIUM + VITAMIN D3 PO), Take  2 tablets by mouth daily., Disp: , Rfl:    hydrochlorothiazide (HYDRODIURIL) 25 MG tablet, TAKE 1 TABLET(25 MG) BY MOUTH DAILY, Disp: 90 tablet, Rfl: 3   Lancets (ONETOUCH DELICA PLUS 123XX123) MISC, Apply topically daily., Disp: , Rfl:    metFORMIN (GLUCOPHAGE-XR) 500 MG 24 hr tablet, Take 2 tablets (1,000 mg total) by mouth daily with breakfast., Disp: 180 tablet, Rfl: 1   Omega-3 Fatty Acids (FISH OIL) 1000 MG CAPS, Take 2 capsules by mouth daily., Disp: , Rfl:    ONETOUCH VERIO test strip, daily., Disp: , Rfl:    polyethylene glycol (MIRALAX / GLYCOLAX) packet, Take 17 g by mouth once a week., Disp: , Rfl:    vitamin B-12 (CYANOCOBALAMIN) 1000 MCG tablet, Take 1,000 mcg by mouth daily., Disp: , Rfl:   Physical exam:  Vitals:   01/31/23 1304  BP: 137/64  Pulse: 83  Resp: 18  Temp: 99.6 F (37.6 C)  TempSrc: Tympanic  SpO2: 99%  Weight: 179 lb 1.6 oz (81.2 kg)  Height: '5\' 6"'$  (1.676 m)   Physical Exam Cardiovascular:     Rate and Rhythm: Normal rate and regular rhythm.     Heart sounds: Normal heart sounds.  Pulmonary:     Effort: Pulmonary effort is normal.     Breath sounds: Normal breath  sounds.  Abdominal:     General: Bowel sounds are normal.     Palpations: Abdomen is soft.  Skin:    General: Skin is warm and dry.  Neurological:     Mental Status: She is alert and oriented to person, place, and time.   Patient is s/p bilateral lumpectomy with a well-healed surgical scar.  No palpable masses in either breast.  No palpable bilateral axillary adenopathy.     Latest Ref Rng & Units 11/14/2022   11:35 AM  CMP  Glucose 70 - 99 mg/dL 130   BUN 6 - 23 mg/dL 23   Creatinine 0.40 - 1.20 mg/dL 1.02   Sodium 135 - 145 mEq/L 142   Potassium 3.5 - 5.1 mEq/L 4.2   Chloride 96 - 112 mEq/L 101   CO2 19 - 32 mEq/L 32   Calcium 8.4 - 10.5 mg/dL 9.7   Total Protein 6.0 - 8.3 g/dL 7.0   Total Bilirubin 0.2 - 1.2 mg/dL 0.8   Alkaline Phos 39 - 117 U/L 62   AST 0 - 37 U/L 26   ALT 0 - 35 U/L 31       Latest Ref Rng & Units 07/31/2022   12:41 PM  CBC  WBC 4.0 - 10.5 K/uL 7.7   Hemoglobin 12.0 - 15.0 g/dL 15.7   Hematocrit 36.0 - 46.0 % 45.0   Platelets 150 - 400 K/uL 276     No images are attached to the encounter.  DG Bone Density  Result Date: 01/04/2023 EXAM: DUAL X-RAY ABSORPTIOMETRY (DXA) FOR BONE MINERAL DENSITY IMPRESSION: Your patient Janae Oshana completed a BMD test on 01/04/2023 using the [place] (software version: 14.10) manufactured by [employer]. The following summarizes the results of our evaluation. Technologist:TNB PATIENT BIOGRAPHICAL: Name: Shary, Feng Patient ID: [id number] Birth Date: Apr 22, 1951 Height: 65.6 in. Gender: Female Exam Date: 01/04/2023 Weight: 179.9 lbs. Indications: Caucasian, History of Breast Cancer, Oophorectomy Bilateral, Osteopenia, Postmenopausal Fractures: Treatments: calcium w/ vit D, Anastrozole DENSITOMETRY RESULTS: Site          Region     Measured Date Measured Age WHO Classification Young Adult  T-score BMD         %Change vs. Previous Significant Change (*) DualFemur Neck Left 01/04/2023  70.5 Normal -0.7 0.947 g/cm2 2.3% - DualFemur Neck Left 10/17/2021 69.3 Normal -0.8 0.926 g/cm2 - - DualFemur Total Mean 01/04/2023 70.5 Normal -0.2 0.976 g/cm2 -1.6% - DualFemur Total Mean 10/17/2021 69.3 Normal -0.1 0.992 g/cm2 - - Right Forearm Radius 33% 01/04/2023 70.5 Osteopenia -2.3 0.671 g/cm2 0.0% - Right Forearm Radius 33% 10/17/2021 69.3 Osteopenia -2.3 0.671 g/cm2 - - ASSESSMENT: The BMD measured at Forearm Radius 33% is 0.671 g/cm2 with a T-score of -2.3. This patient is considered osteopenic according to [facility]) criteria. Compared with prior study, there has been no significant change in the left hip. Compared with prior study, there has been no significant change in the total mean. Compared with prior study, there has been no significant change in the right forearm. Lumbar spine was not utilized due to advanced degenerative changes. The scan quality is good. World Pharmacologist [facility]) criteria for post-menopausal, Caucasian Women: Normal:                   T-score at or above -1 SD Osteopenia/low bone mass: T-score between -1 and -2.5 SD Osteoporosis:             T-score at or below -2.5 SD RECOMMENDATIONS: 1. All patients should optimize calcium and vitamin D intake. 2. Consider FDA-approved medical therapies in postmenopausal women and men aged 35 years and older, based on the following: a. A hip or vertebral(clinical or morphometric) fracture b. T-score < -2.5 at the femoral neck or spine after appropriate evaluation to exclude secondary causes c. Low bone mass (T-score between -1.0 and -2.5 at the femoral neck or spine) and a 10-year probability of a hip fracture > 3% or a 10-year probability of a major osteoporosis-related fracture > 20% based on the [place]-adapted WHO algorithm 3. Clinician judgment and/or patient preferences may indicate treatment for people with 10-year fracture probabilities above or below these levels FOLLOW-UP: People with diagnosed cases of  osteoporosis or at high risk for fracture should have regular bone mineral density tests. For patients eligible for Medicare, routine testing is allowed once every 2 years. The testing frequency can be increased to one year for patients who have rapidly progressing disease, those who are receiving or discontinuing medical therapy to restore bone mass, or have additional risk factors. I have reviewed this report, and agree with the above findings. [facility] Dear Weston Anna Moneka Mcquinn, Your patient LAKSHANA DRUMWRIGHT completed a FRAX assessment on 01/04/2023 using the [place] (analysis version: 14.10) manufactured by [employer]. The following summarizes the results of our evaluation. PATIENT BIOGRAPHICAL: Name: Lyola, [place] Patient ID: [id number] Birth Date: [birth year] Height:    65.6 in. Gender:     Female    [age]       Weight:    179.9 lbs. Ethnicity:  White                            Exam Date: 01/04/2023 FRAX* RESULTS:  (version: 3.5) 10-year Probability of Fracture1 Major Osteoporotic Fracture2 Hip Fracture 8.0% 0.7% Population: [place] (Caucasian) Risk Factors: None Based on DualFemur (Left) Neck BMD 1 -The 10-year probability of fracture may be lower than reported if the patient has received treatment. 2 -Major Osteoporotic Fracture: Clinical Spine, Forearm, Hip  or Shoulder *FRAX is a Materials engineer of the [employer] for Metabolic Bone Disease, a [place] (WHO) [facility]. ASSESSMENT: The probability of a major osteoporotic fracture is 8.0% within the next ten years. The probability of a hip fracture is 0.7% within the next ten years. . Electronically Signed   By: Dorise Bullion III M.D.   On: 01/04/2023 10:46     Assessment and plan- Patient is a 72 y.o. female with  invasive mammary carcinoma of the right breast pathological prognostic stage IA pT1 pN0 cM0, grade 1 ER PR positive HER-2/neu negative  status post lumpectomy and adjuvant radiation treatment.   This is a routine follow-up visit  Clinically patient is doing well with no concerning signs and symptoms of recurrence based on today's exam.  Patient started Arimidex in February 2020 and will need to take it for 1 more year to complete 5 years of treatment.She will be due for routine screening mammogram in November 2025 which we will schedule.  I did review her bone density scan that was done recently which showed osteopenia in the forearm with a T-score of -2.3.  This has remained overall stable as compared to her prior bone density in 2022.  She does not require any adjuvant bisphosphonates at this time.   Visit Diagnosis 1. Encounter for follow-up surveillance of breast cancer   2. Visit for monitoring Arimidex therapy   3. Osteopenia of right forearm      Dr. Randa Evens, MD, MPH [facility] [id number] 01/31/2023 1:09 PM

## 2023-02-16 ENCOUNTER — Encounter: Payer: Self-pay | Admitting: Family Medicine

## 2023-02-16 ENCOUNTER — Ambulatory Visit (INDEPENDENT_AMBULATORY_CARE_PROVIDER_SITE_OTHER): Payer: PPO | Admitting: Family Medicine

## 2023-02-16 VITALS — BP 120/70 | HR 94 | Temp 98.3°F | Ht 65.59 in | Wt 177.2 lb

## 2023-02-16 DIAGNOSIS — I10 Essential (primary) hypertension: Secondary | ICD-10-CM | POA: Diagnosis not present

## 2023-02-16 DIAGNOSIS — E1159 Type 2 diabetes mellitus with other circulatory complications: Secondary | ICD-10-CM

## 2023-02-16 LAB — POCT GLYCOSYLATED HEMOGLOBIN (HGB A1C): Hemoglobin A1C: 6.8 % — AB (ref 4.0–5.6)

## 2023-02-16 MED ORDER — METFORMIN HCL 850 MG PO TABS: ORAL_TABLET | ORAL | 3 refills | Status: DC

## 2023-02-16 NOTE — Assessment & Plan Note (Signed)
Stable, chronic.  Continue current medication.     HCTZ 25 mg daily 

## 2023-02-16 NOTE — Patient Instructions (Addendum)
Set up yearly eye exam for diabetes and have the opthalmologist send [place] a copy of the evaluation for the chart.  Stop the XR metformin change to metformin shorter release  TWICE daily to hopefully decrease diarrhea.  Follow up as planned in 3 months.

## 2023-02-16 NOTE — Assessment & Plan Note (Addendum)
Chronic, improved control with diet changes , having diarrhea with increased dose of metformin at Metformin  500 mg XL 2 tablets daily   We will stop the metformin extended release and change to metformin short release 850 mg p.o. twice daily. Hopefully this will help decrease her diarrhea side effect.  We will reevaluate A1c in 3 months.  She will call if side effects or not improved.  Due for yearly eye exam

## 2023-02-16 NOTE — Progress Notes (Signed)
Patient ID: Bridget Cummings, female    DOB: Jun 14, 1951, 72 y.o.   MRN: [id number]  This visit was conducted in person.  BP 120/70   Pulse 94   Temp 98.3 F (36.8 C) (Temporal)   Ht 5' 5.59" (1.666 m)   Wt 177 lb 4 oz (80.4 kg)   SpO2 99%   BMI 28.97 kg/m    CC:  Chief Complaint  Patient presents with   Diabetes    Subjective:   HPI: Bridget Cummings is a 72 y.o. female presenting on 02/16/2023 for Diabetes  Diabetes: Well-controlled on metformin XR 500 mg 2 tablets daily. Down from 11.4,  6 months ago Lab Results  Component Value Date   HGBA1C 6.8 (A) 02/16/2023  Using medications without difficulties:  some intermittent diarrhea, wishes to decrease dose Hypoglycemic episodes: Hyperglycemic episodes: Feet problems: Blood Sugars averaging: eye exam within last year: Due  Hypertension:  Well-controlled on hydrochlorothiazide 25 mg p.o. daily BP Readings from Last 3 Encounters:  02/16/23 120/70  01/31/23 137/64  12/21/22 116/78  Using medication without problems or lightheadedness:  Chest pain with exertion: Edema: Short of breath: Average home BPs: Other issues:      Relevant past medical, surgical, family and social history reviewed and updated as indicated. Interim medical history since our last visit reviewed. Allergies and medications reviewed and updated. Outpatient Medications Prior to Visit  Medication Sig Dispense Refill   anastrozole (ARIMIDEX) 1 MG tablet TAKE 1 TABLET(1 MG) BY MOUTH DAILY 90 tablet 3   aspirin 81 MG tablet Take 81 mg by mouth at bedtime.     atorvastatin (LIPITOR) 20 MG tablet Take 1 tablet (20 mg total) by mouth daily. 90 tablet 3   Blood Glucose Monitoring Suppl (ONETOUCH VERIO REFLECT) w/Device KIT USE TO CHECK BLOOD SUGAR AS DIRECTED.     Calcium Carb-Cholecalciferol (CALCIUM + VITAMIN D3 PO) Take 2 tablets by mouth daily.     hydrochlorothiazide (HYDRODIURIL) 25 MG tablet TAKE 1 TABLET(25 MG) BY MOUTH DAILY 90 tablet  3   Lancets (ONETOUCH DELICA PLUS 123XX123) MISC Apply topically daily.     Omega-3 Fatty Acids (FISH OIL) 1000 MG CAPS Take 2 capsules by mouth daily.     ONETOUCH VERIO test strip daily.     vitamin B-12 (CYANOCOBALAMIN) 1000 MCG tablet Take 1,000 mcg by mouth daily.     metFORMIN (GLUCOPHAGE-XR) 500 MG 24 hr tablet Take 2 tablets (1,000 mg total) by mouth daily with breakfast. 180 tablet 1   polyethylene glycol (MIRALAX / GLYCOLAX) packet Take 17 g by mouth once a week.     No facility-administered medications prior to visit.     Per HPI unless specifically indicated in ROS section below Review of Systems  Constitutional:  Negative for fatigue and fever.  HENT:  Negative for congestion.   Eyes:  Negative for pain.  Respiratory:  Negative for cough and shortness of breath.   Cardiovascular:  Negative for chest pain, palpitations and leg swelling.  Gastrointestinal:  Negative for abdominal pain.  Genitourinary:  Negative for dysuria and vaginal bleeding.  Musculoskeletal:  Negative for back pain.  Neurological:  Negative for syncope, light-headedness and headaches.  Psychiatric/Behavioral:  Negative for dysphoric mood.    Objective:  BP 120/70   Pulse 94   Temp 98.3 F (36.8 C) (Temporal)   Ht 5' 5.59" (1.666 m)   Wt 177 lb 4 oz (80.4 kg)   SpO2 99%   BMI 28.97 kg/m  Wt Readings from Last 3 Encounters:  02/16/23 177 lb 4 oz (80.4 kg)  01/31/23 179 lb 1.6 oz (81.2 kg)  12/21/22 179 lb 14.4 oz (81.6 kg)      Physical Exam Constitutional:      General: She is not in acute distress.    Appearance: Normal appearance. She is well-developed. She is not ill-appearing or toxic-appearing.  HENT:     Head: Normocephalic.     Right Ear: Hearing, tympanic membrane, ear canal and external ear normal. Tympanic membrane is not erythematous, retracted or bulging.     Left Ear: Hearing, tympanic membrane, ear canal and external ear normal. Tympanic membrane is not erythematous,  retracted or bulging.     Nose: No mucosal edema or rhinorrhea.     Right Sinus: No maxillary sinus tenderness or frontal sinus tenderness.     Left Sinus: No maxillary sinus tenderness or frontal sinus tenderness.     Mouth/Throat:     Pharynx: Uvula midline.  Eyes:     General: Lids are normal. Lids are everted, no foreign bodies appreciated.     Conjunctiva/sclera: Conjunctivae normal.     Pupils: Pupils are equal, round, and reactive to light.  Neck:     Thyroid: No thyroid mass or thyromegaly.     Vascular: No carotid bruit.     Trachea: Trachea normal.  Cardiovascular:     Rate and Rhythm: Normal rate and regular rhythm.     Pulses: Normal pulses.     Heart sounds: Normal heart sounds, S1 normal and S2 normal. No murmur heard.    No friction rub. No gallop.  Pulmonary:     Effort: Pulmonary effort is normal. No tachypnea or respiratory distress.     Breath sounds: Normal breath sounds. No decreased breath sounds, wheezing, rhonchi or rales.  Abdominal:     General: Bowel sounds are normal.     Palpations: Abdomen is soft.     Tenderness: There is no abdominal tenderness.  Musculoskeletal:     Cervical back: Normal range of motion and neck supple.  Skin:    General: Skin is warm and dry.     Findings: No rash.  Neurological:     Mental Status: She is alert.  Psychiatric:        Mood and Affect: Mood is not anxious or depressed.        Speech: Speech normal.        Behavior: Behavior normal. Behavior is cooperative.        Thought Content: Thought content normal.        Judgment: Judgment normal.       Results for orders placed or performed in visit on 02/16/23  POCT glycosylated hemoglobin (Hb A1C)  Result Value Ref Range   Hemoglobin A1C 6.8 (A) 4.0 - 5.6 %   HbA1c POC (<> result, manual entry)     HbA1c, POC (prediabetic range)     HbA1c, POC (controlled diabetic range)      Assessment and Plan  Type 2 diabetes mellitus with other circulatory complications  (HCC) Assessment & Plan: Chronic, improved control with diet changes , having diarrhea with increased dose of metformin at Metformin  500 mg XL 2 tablets daily   We will stop the metformin extended release and change to metformin short release 850 mg p.o. twice daily. Hopefully this will help decrease her diarrhea side effect.  We will reevaluate A1c in 3 months.  She will call if side effects  or not improved.  Due for yearly eye exam  Orders: -     POCT glycosylated hemoglobin (Hb A1C)  Essential hypertension Assessment & Plan: Stable, chronic.  Continue current medication.   HCTZ 25 mg daily.   Other orders -     metFORMIN HCl; 1 tablet  po twice daily  Dispense: 180 tablet; Refill: 3    Return in about 3 months (around 05/19/2023) for  After 6/21  phone AMW nurse visit  have appt for physical  ( will do labs same day pt request).   Eliezer Lofts, MD

## 2023-03-09 ENCOUNTER — Ambulatory Visit: Payer: PPO | Admitting: Radiation Oncology

## 2023-03-12 ENCOUNTER — Ambulatory Visit
Admission: RE | Admit: 2023-03-12 | Discharge: 2023-03-12 | Disposition: A | Discharge status: Home / Self Care | Payer: PPO | Source: Ambulatory Visit | Attending: Radiation Oncology | Admitting: Radiation Oncology

## 2023-03-12 ENCOUNTER — Encounter: Payer: Self-pay | Admitting: Radiation Oncology

## 2023-03-12 VITALS — BP 162/82 | HR 85 | Temp 97.6°F | Resp 16 | Ht 66.0 in | Wt 178.0 lb

## 2023-03-12 DIAGNOSIS — C50911 Malignant neoplasm of unspecified site of right female breast: Secondary | ICD-10-CM | POA: Diagnosis not present

## 2023-03-12 DIAGNOSIS — Z170 Estrogen receptor positive status [ER+]: Secondary | ICD-10-CM | POA: Insufficient documentation

## 2023-03-12 DIAGNOSIS — Z923 Personal history of irradiation: Secondary | ICD-10-CM | POA: Insufficient documentation

## 2023-03-12 DIAGNOSIS — C50211 Malignant neoplasm of upper-inner quadrant of right female breast: Secondary | ICD-10-CM | POA: Diagnosis not present

## 2023-03-12 DIAGNOSIS — Z79811 Long term (current) use of aromatase inhibitors: Secondary | ICD-10-CM | POA: Insufficient documentation

## 2023-03-12 NOTE — Progress Notes (Signed)
Radiation Oncology Follow up Note  Name: Bridget Cummings   Date:   03/12/2023 MRN:  [id number] DOB: 1951-06-06    This 72 y.o. female presents to the clinic today for 4-year follow-up status post whole breast radiation to her right breast for stage I ER/PR positive invasive mammary carcinoma.  REFERRING PROVIDER: Excell Seltzer, MD  HPI: Patient is a 72 year old female now out for over 4 years having completed whole breast radiation to her right breast for stage I ER/PR positive invasive mammary carcinoma.  Seen today in routine follow-up she is doing well.  She specifically denies breast tenderness cough or bone pain..  She mammograms back in November which I have reviewed were BI-RADS 1 negative.  She is currently on Arimidex tolerating that well without side effect.  COMPLICATIONS OF TREATMENT: none  FOLLOW UP COMPLIANCE: keeps appointments   PHYSICAL EXAM:  BP (!) 162/82 Comment: Patient states she has "white coat syndrome"  Pulse 85   Temp 97.6 F (36.4 C)   Resp 16   Ht 5\' 6"  (1.676 m)   Wt 178 lb (80.7 kg)   BMI 28.73 kg/m  Lungs are clear to A&P cardiac examination essentially unremarkable with regular rate and rhythm. No dominant mass or nodularity is noted in either breast in 2 positions examined. Incision is well-healed. No axillary or supraclavicular adenopathy is appreciated. Cosmetic result is excellent.  Well-developed well-nourished patient in NAD. HEENT reveals PERLA, EOMI, discs not visualized.  Oral cavity is clear. No oral mucosal lesions are identified. Neck is clear without evidence of cervical or supraclavicular adenopathy. Lungs are clear to A&P. Cardiac examination is essentially unremarkable with regular rate and rhythm without murmur rub or thrill. Abdomen is benign with no organomegaly or masses noted. Motor sensory and DTR levels are equal and symmetric in the upper and lower extremities. Cranial nerves II through XII are grossly intact. Proprioception is  intact. No peripheral adenopathy or edema is identified. No motor or sensory levels are noted. Crude visual fields are within normal range.  RADIOLOGY RESULTS: Mammograms reviewed compatible with above-stated findings  PLAN: The present time patient is now out over 4 years with no evidence of disease.  I am going to discontinue follow-up care she continues follow-up care with her medical oncologist and surgeon.  I be happy to reevaluate her anytime should that be indicated.  Patient knows to call with any concerns.  I would like to take this opportunity to thank you for allowing me to participate in the care of your patient.Carmina Miller, MD

## 2023-03-27 ENCOUNTER — Other Ambulatory Visit: Payer: Self-pay | Admitting: Family Medicine

## 2023-04-02 ENCOUNTER — Other Ambulatory Visit: Payer: Self-pay | Admitting: Family Medicine

## 2023-04-18 DIAGNOSIS — D225 Melanocytic nevi of trunk: Secondary | ICD-10-CM | POA: Diagnosis not present

## 2023-04-18 DIAGNOSIS — D2272 Melanocytic nevi of left lower limb, including hip: Secondary | ICD-10-CM | POA: Diagnosis not present

## 2023-04-18 DIAGNOSIS — L821 Other seborrheic keratosis: Secondary | ICD-10-CM | POA: Diagnosis not present

## 2023-04-18 DIAGNOSIS — D2261 Melanocytic nevi of right upper limb, including shoulder: Secondary | ICD-10-CM | POA: Diagnosis not present

## 2023-04-18 DIAGNOSIS — L309 Dermatitis, unspecified: Secondary | ICD-10-CM | POA: Diagnosis not present

## 2023-04-18 DIAGNOSIS — L570 Actinic keratosis: Secondary | ICD-10-CM | POA: Diagnosis not present

## 2023-04-18 DIAGNOSIS — D2271 Melanocytic nevi of right lower limb, including hip: Secondary | ICD-10-CM | POA: Diagnosis not present

## 2023-04-18 DIAGNOSIS — D2262 Melanocytic nevi of left upper limb, including shoulder: Secondary | ICD-10-CM | POA: Diagnosis not present

## 2023-05-21 LAB — HM DIABETES EYE EXAM

## 2023-05-28 ENCOUNTER — Ambulatory Visit (INDEPENDENT_AMBULATORY_CARE_PROVIDER_SITE_OTHER): Payer: PPO

## 2023-05-28 VITALS — Ht 66.0 in | Wt 170.0 lb

## 2023-05-28 DIAGNOSIS — Z0000 Encounter for general adult medical examination without abnormal findings: Secondary | ICD-10-CM | POA: Diagnosis not present

## 2023-05-28 DIAGNOSIS — Z1231 Encounter for screening mammogram for malignant neoplasm of breast: Secondary | ICD-10-CM

## 2023-05-28 NOTE — Progress Notes (Signed)
Subjective:   Bridget Cummings is a 72 y.o. female who presents for Medicare Annual (Subsequent) preventive examination.  Visit Complete: Virtual  I connected with  Bridget Cummings on 05/28/23 by a audio enabled telemedicine application and verified that I am speaking with the correct person using two identifiers.  Patient Location: Home  Provider Location: Home Office  I discussed the limitations of evaluation and management by telemedicine. The patient expressed understanding and agreed to proceed.   Review of Systems      Cardiac Risk Factors include: advanced age (>8men, >66 women);hypertension;diabetes mellitus;obesity (BMI >30kg/m2);dyslipidemia     Objective:    Today's Vitals   05/28/23 1039  Weight: 170 lb (77.1 kg)  Height: 5\' 6"  (1.676 m)   Body mass index is 27.44 kg/m.     05/28/2023   10:46 AM 03/12/2023   11:30 AM 01/31/2023    1:02 PM 10/13/2022    1:23 PM 07/31/2022   12:51 PM 05/22/2022   12:41 PM 01/23/2022   11:56 AM  Advanced Directives  Does Patient Have a Medical Advance Directive? No No No No No No No  Type of Surveyor, minerals;Living will      Does patient want to make changes to medical advance directive?   No - Patient declined      Would patient like information on creating a medical advance directive? No - Patient declined No - Patient declined No - Patient declined No - Patient declined No - Patient declined No - Patient declined No - Patient declined    Current Medications (verified) Outpatient Encounter Medications as of 05/28/2023  Medication Sig   anastrozole (ARIMIDEX) 1 MG tablet TAKE 1 TABLET(1 MG) BY MOUTH DAILY   aspirin 81 MG tablet Take 81 mg by mouth at bedtime.   atorvastatin (LIPITOR) 20 MG tablet Take 1 tablet (20 mg total) by mouth daily.   Blood Glucose Monitoring Suppl (ONETOUCH VERIO REFLECT) w/Device KIT USE TO CHECK BLOOD SUGAR AS DIRECTED.   Calcium Carb-Cholecalciferol (CALCIUM  + VITAMIN D3 PO) Take 2 tablets by mouth daily.   hydrochlorothiazide (HYDRODIURIL) 25 MG tablet TAKE 1 TABLET(25 MG) BY MOUTH DAILY   Lancets (ONETOUCH DELICA PLUS LANCET33G) MISC Apply topically daily.   metFORMIN (GLUCOPHAGE) 850 MG tablet 1 tablet  po twice daily   ONETOUCH VERIO test strip daily.   Omega-3 Fatty Acids (FISH OIL) 1000 MG CAPS Take 2 capsules by mouth daily. (Patient not taking: Reported on 05/28/2023)   vitamin B-12 (CYANOCOBALAMIN) 1000 MCG tablet Take 1,000 mcg by mouth daily. (Patient not taking: Reported on 05/28/2023)   No facility-administered encounter medications on file as of 05/28/2023.    Allergies (verified) Peppermint flavor   History: Past Medical History:  Diagnosis Date   Allergy    Breast cancer (HCC) 2008   Left Breast Cancer   Breast cancer (HCC) 2019   Right Breast Cancer   Diabetes mellitus without complication (HCC)    Family history of breast cancer    Hyperlipidemia    Personal history of radiation therapy 2008   Left Breast Cancer   Personal history of radiation therapy 2019   Right Breast Cancer   Past Surgical History:  Procedure Laterality Date   BREAST BIOPSY     BREAST LUMPECTOMY Left 2008   BREAST LUMPECTOMY Right 11/04/2018   BREAST LUMPECTOMY WITH RADIOACTIVE SEED AND SENTINEL LYMPH NODE BIOPSY Right 11/04/2018   Procedure: RIGHT BREAST LUMPECTOMY WITH RADIOACTIVE SEED  AND RIGHT AXILLARY SENTINEL LYMPH NODE BIOPSY;  Surgeon: Claud Kelp, MD;  Location: [facility] OR;  Service: General;  Laterality: Right;   FOOT SURGERY     x5 total   OOPHORECTOMY     OVARY REMOVED  2009   TONSILLECTOMY     Family History  Problem Relation Age of Onset   Breast cancer Mother 30   Diabetes Father    Breast cancer Sister 18   Diabetes Brother    Breast cancer Maternal Aunt 72   Heart attack Maternal Grandmother 72   Breast cancer Cousin        are dx unk   Social History   Socioeconomic History   Marital status: Married    Spouse  name: Not on file   Number of children: 2   Years of education: Not on file   Highest education level: Not on file  Occupational History   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    Employer: [employer]  Tobacco Use   Smoking status: Never   Smokeless tobacco: Never  Vaping Use   Vaping Use: Never used  Substance and Sexual Activity   Alcohol use: No   Drug use: No   Sexual activity: Yes  Other Topics Concern   Not on file  Social History Narrative   Regular exercise-yes, walks 45 minutes every day   Diet: fruit and veggies and chicken avoiding salt   Social Determinants of Health   Financial Resource Strain: Low Risk  (05/28/2023)   Overall Financial Resource Strain (CARDIA)    Difficulty of Paying Living Expenses: Not hard at all  Food Insecurity: No Food Insecurity (05/28/2023)   Hunger Vital Sign    Worried About Running Out of Food in the Last Year: Never true    Ran Out of Food in the Last Year: Never true  Transportation Needs: No Transportation Needs (05/28/2023)   PRAPARE - Administrator, Civil Service (Medical): No    Lack of Transportation (Non-Medical): No  Physical Activity: Sufficiently Active (05/28/2023)   Exercise Vital Sign    Days of Exercise per Week: 3 days    Minutes of Exercise per Session: 60 min  Stress: No Stress Concern Present (05/28/2023)   [employer] of Occupational Health - Occupational Stress Questionnaire    Feeling of Stress : Not at all  Social Connections: Moderately Integrated (05/28/2023)   Social Connection and Isolation Panel [NHANES]    Frequency of Communication with Friends and Family: More than three times a week    Frequency of Social Gatherings with Friends and Family: More than three times a week    Attends Religious Services: Never    Database administrator or Organizations: Yes    Attends Engineer, structural: More than 4 times per year    Marital Status: Married    Tobacco Counseling Counseling  given: Not Answered   Clinical Intake:  Pre-visit preparation completed: Yes  Pain : No/denies pain     BMI - recorded: 27.44 Nutritional Risks: None Diabetes: Yes CBG done?: No Did pt. bring in CBG monitor from home?: No  How often do you need to have someone help you when you read instructions, pamphlets, or other written materials from your doctor or pharmacy?: 1 - Never  Interpreter Needed?: No  Information entered by :: C.Joshau Code LPN   Activities of Daily Living    05/28/2023   10:46 AM  In your present state of health, do you have any difficulty  performing the following activities:  Hearing? 0  Vision? 0  Difficulty concentrating or making decisions? 0  Walking or climbing stairs? 0  Dressing or bathing? 0  Doing errands, shopping? 0  Preparing Food and eating ? N  Using the Toilet? N  In the past six months, have you accidently leaked urine? N  Do you have problems with loss of bowel control? N  Managing your Medications? N  Managing your Finances? N  Housekeeping or managing your Housekeeping? N    Patient Care Team: Excell Seltzer, MD as PCP - General Magrinat, Valentino Hue, MD (Inactive) as Consulting Physician (Oncology) Claud Kelp, MD as Consulting Physician (General Surgery) Carmina Miller, MD as Referring Physician (Radiation Oncology) Jeralyn Ruths, MD as Consulting Physician (Oncology)  Indicate any recent Medical Services you may have received from other than [facility] providers in the past year (date may be approximate).     Assessment:   This is a routine wellness examination for Kaysen.  Hearing/Vision screen Hearing Screening - Comments:: No issues Vision Screening - Comments:: Readers - Dr.Richardson - UTD on eye exams  Dietary issues and exercise activities discussed:     Goals Addressed             This Visit's Progress    Patient Stated       No new goals       Depression Screen    05/28/2023   10:45 AM  10/13/2022    1:24 PM 05/22/2022   12:38 PM 05/17/2021   12:43 PM 05/21/2020   10:05 AM 05/16/2019   10:43 AM 02/22/2018   12:25 PM  PHQ 2/9 Scores  PHQ - 2 Score 0 0 0 0 0 0 0  PHQ- 9 Score    0       Fall Risk    05/28/2023   10:46 AM 10/13/2022    1:24 PM 05/22/2022   12:40 PM 05/17/2021   12:42 PM 05/21/2020   10:06 AM  Fall Risk   Falls in the past year? 0 0 0 0 0  Number falls in past yr: 0  0 0   Injury with Fall? 0  0 0   Risk for fall due to : No Fall Risks  No Fall Risks Medication side effect   Follow up Falls prevention discussed;Falls evaluation completed   Falls evaluation completed;Falls prevention discussed     MEDICARE RISK AT HOME:  Medicare Risk at Home - 05/28/23 1047     Any stairs in or around the home? Yes    If so, are there any without handrails? No    Home free of loose throw rugs in walkways, pet beds, electrical cords, etc? Yes    Adequate lighting in your home to reduce risk of falls? Yes    Life alert? No    Use of a cane, walker or w/c? No    Grab bars in the bathroom? No    Shower chair or bench in shower? No    Elevated toilet seat or a handicapped toilet? No               Cognitive Function:    05/17/2021   12:44 PM  MMSE - Mini Mental State Exam  Orientation to time 5  Orientation to Place 5  Registration 3  Attention/ Calculation 5  Recall 3  Language- repeat 1        05/28/2023   10:48 AM 05/22/2022   12:42 PM  6CIT Screen  What Year? 4 points 0 points  What month? 0 points 0 points  What time? 0 points 0 points  Count back from 20 0 points 0 points  Months in reverse 0 points 0 points  Repeat phrase 2 points 0 points  Total Score 6 points 0 points    Immunizations Immunization History  Administered Date(s) Administered   COVID-19, mRNA, vaccine(Comirnaty)12 years and older 09/01/2022   Fluad Quad(high Dose 65+) 08/23/2019   Influenza Split 09/21/2011, 09/26/2012   Influenza Whole 12/04/2005, 12/20/2007,  08/24/2008, 09/21/2009, 09/13/2010   Influenza, High Dose Seasonal PF 08/28/2017, 09/12/2021, 09/01/2022   Influenza, Seasonal, Injecte, Preservative Fre 08/31/2014, 09/04/2015, 09/05/2016   Influenza,inj,Quad PF,6+ Mos 09/30/2013   Influenza,trivalent, recombinat, inj, PF 07/30/2018   Moderna SARS-COV2 Booster Vaccination 09/27/2020   Moderna Sars-Covid-2 Vaccination 01/07/2020, 02/04/2020   Pfizer Covid-19 Vaccine Bivalent Booster 5y-11y 09/12/2021   Pneumococcal Conjugate-13 06/21/2017   Pneumococcal Polysaccharide-23 06/26/2018   Respiratory Syncytial Virus Vaccine,Recomb Aduvanted(Arexvy) 09/12/2022   Td 12/05/1999, 09/13/2010, 05/25/2022   Zoster Recombinat (Shingrix) 12/05/2021, 03/13/2022   Zoster, Live 09/26/2012    TDAP status: Up to date  Flu Vaccine status: Up to date  Pneumococcal vaccine status: Up to date  Covid-19 vaccine status: Information provided on how to obtain vaccines.   Qualifies for Shingles Vaccine? Yes   Zostavax completed No   Shingrix Completed?: Yes  Screening Tests Health Maintenance  Topic Date Due   OPHTHALMOLOGY EXAM  Never done   COVID-19 Vaccine (5 - 2023-24 season) 10/27/2022   INFLUENZA VACCINE  07/05/2023   FOOT EXAM  08/16/2023   HEMOGLOBIN A1C  08/19/2023   Diabetic kidney evaluation - eGFR measurement  11/15/2023   Diabetic kidney evaluation - Urine ACR  11/15/2023   Medicare Annual Wellness (AWV)  05/27/2024   MAMMOGRAM  10/18/2024   DEXA SCAN  01/05/2028   Colonoscopy  12/02/2029   DTaP/Tdap/Td (4 - Tdap) 05/25/2032   Pneumonia Vaccine 54+ Years old  Completed   Hepatitis C Screening  Completed   Zoster Vaccines- Shingrix  Completed   HPV VACCINES  Aged Out    Health Maintenance  Health Maintenance Due  Topic Date Due   OPHTHALMOLOGY EXAM  Never done   COVID-19 Vaccine (5 - 2023-24 season) 10/27/2022    Colorectal cancer screening: Type of screening: Colonoscopy. Completed 12/03/19. Repeat every 10  years  Mammogram status: Ordered 05/28/23. Pt provided with contact info and advised to call to schedule appt.   Bone Density status: Completed 01/04/23. Results reflect: Bone density results: OSTEOPENIA. Repeat every 2 years.  Lung Cancer Screening: (Low Dose CT Chest recommended if Age 102-80 years, 20 pack-year currently smoking OR have quit w/in 15years.) does not qualify.   Lung Cancer Screening Referral: n/a  Additional Screening:  Hepatitis C Screening: does qualify; Completed 10/14/12  Vision Screening: Recommended annual ophthalmology exams for early detection of glaucoma and other disorders of the eye. Is the patient up to date with their annual eye exam?  Yes  Who is the provider or what is the name of the office in which the patient attends annual eye exams? Dr.Richardson If pt is not established with a provider, would they like to be referred to a provider to establish care? Yes .   Dental Screening: Recommended annual dental exams for proper oral hygiene  Diabetic Foot Exam: Diabetic Foot Exam: Completed 08/15/22  Community Resource Referral / Chronic Care Management: CRR required this visit?  No   CCM required this  visit?  No     Plan:     I have personally reviewed and noted the following in the patient's chart:   Medical and social history Use of alcohol, tobacco or illicit drugs  Current medications and supplements including opioid prescriptions. Patient is not currently taking opioid prescriptions. Functional ability and status Nutritional status Physical activity Advanced directives List of other physicians Hospitalizations, surgeries, and ER visits in previous 12 months Vitals Screenings to include cognitive, depression, and falls Referrals and appointments  In addition, I have reviewed and discussed with patient certain preventive protocols, quality metrics, and best practice recommendations. A written personalized care plan for preventive services  as well as general preventive health recommendations were provided to patient.     Maryan Puls, LPN   12/22/1476   After Visit Summary: (MyChart) Due to this being a telephonic visit, the after visit summary with patients personalized plan was offered to patient via MyChart   Nurse Notes: Order for mammogram placed.

## 2023-05-28 NOTE — Patient Instructions (Signed)
Bridget Cummings , Thank you for taking time to come for your Medicare Wellness Visit. I appreciate your ongoing commitment to your health goals. Please review the following plan we discussed and let me know if I can assist you in the future.   These are the goals we discussed:  Goals       Patient Stated (pt-stated)       I will continue to walk daily for about 20 minutes..       Patient Stated      No new goals        This is a list of the screening recommended for you and due dates:  Health Maintenance  Topic Date Due   Eye exam for diabetics  Never done   COVID-19 Vaccine (5 - 2023-24 season) 10/27/2022   Flu Shot  07/05/2023   Complete foot exam   08/16/2023   Hemoglobin A1C  08/19/2023   Yearly kidney function blood test for diabetes  11/15/2023   Yearly kidney health urinalysis for diabetes  11/15/2023   Medicare Annual Wellness Visit  05/27/2024   Mammogram  10/18/2024   DEXA scan (bone density measurement)  01/05/2028   Colon Cancer Screening  12/02/2029   DTaP/Tdap/Td vaccine (4 - Tdap) 05/25/2032   Pneumonia Vaccine  Completed   Hepatitis C Screening  Completed   Zoster (Shingles) Vaccine  Completed   HPV Vaccine  Aged Out   You have an order for:  []   2D Mammogram  [x]   3D Mammogram  []   Bone Density     Please call for appointment:   [facility] [employer] [facility] [phone]  [facility]  [address] [place] [address] [phone]  [facility] [address] [place], [place] [address] [phone]  [facility] [address] Geanie Logan [place], [place] [address] [phone]  [facility] [address] [place], [place] [address] [phone]  [facility] [address] [place], [place] [address] [phone]  [facility] [place] [address] [place], [place] [address] [phone]  [facility] [place] [facility] [address] [place], [place] [address] [phone]  [facility] [address] [place], [place] [address] [phone]  [facility] at [facility] [address] [place], [place] [address] [phone]  [facility] [address] [place], [place] [address] [phone]  [facility] [address] [place], [place] [address] [phone]  [facility] at [facility] [address] [place], [place] [address] [phone]  [facility] [place] [address] [place], [place] [address] [phone]  [facility] [address] [place], [place] [address] [phone] [facility]  [facility] at [facility] [address] -Radiology [place], [place] [address] [phone]  [facility] [address], [place] [address] [phone]   Make sure to wear two-piece clothing.  No lotions powders or deodorants the day of the appointment Make sure to bring picture ID and insurance card.  Bring list of medications you are currently taking including any supplements.   Schedule your [place] screening mammogram through MyChart!   Log into your MyChart account.  Go to 'Visit' (or 'Appointments' if on mobile App) --> Schedule an Appointment  Under 'Select a Reason for Visit' choose the Mammogram Screening option.  Complete the pre-visit questions and select the time and place that best fits your schedule.    Advanced directives: none  Conditions/risks identified: none  Next appointment: Follow up in one year for your annual wellness visit 05/28/24 @ 8:15 telephone   Preventive  Care 65 Years and Older, Female Preventive care refers to lifestyle choices and visits with your health care provider that can promote health and wellness. What does preventive care include? A yearly physical exam. This is also called an annual well check. Dental exams once or twice a year. Routine eye exams. Ask your health care provider how often you should have your eyes checked. Personal lifestyle choices, including: Daily care of your teeth and gums. Regular physical activity. Eating a healthy diet. Avoiding tobacco and drug use. Limiting alcohol use. Practicing safe sex. Taking low-dose aspirin every day. Taking vitamin and mineral supplements as recommended by your health care provider. What happens during an annual well check? The services and screenings done by your health care provider during your annual well check will depend on your age, overall health, lifestyle risk factors, and family history of disease. Counseling  Your health care provider may ask you questions about your: Alcohol use. Tobacco use. Drug use. Emotional well-being. Home and relationship well-being. Sexual activity. Eating habits. History of falls. Memory and ability to understand (cognition). Work and work Astronomer. Reproductive health. Screening  You may have the following tests or measurements: Height, weight, and BMI. Blood pressure. Lipid and cholesterol levels. These may be checked every 5 years, or more frequently if you are over 63 years old. Skin check. Lung cancer screening. You may have this screening every year starting at age 23 if you have a 30-pack-year history of smoking and currently smoke or have quit within the past 15 years. Fecal occult blood test (FOBT) of the stool. You may have this test every year starting at age 11. Flexible sigmoidoscopy or colonoscopy. You may have a sigmoidoscopy every 5 years or a colonoscopy every 10 years starting at age 76. Hepatitis C blood  test. Hepatitis B blood test. Sexually transmitted disease (STD) testing. Diabetes screening. This is done by checking your blood sugar (glucose) after you have not eaten for a while (fasting). You may have this done every 1-3 years. Bone density scan. This is done to screen for osteoporosis. You may have this done starting at age 42. Mammogram. This may be done every 1-2 years. Talk to your health care provider about how often you should have regular mammograms. Talk with your health care provider about your test results, treatment options, and if necessary, the need for more tests. Vaccines  Your health care provider may recommend certain vaccines, such as: Influenza vaccine. This is recommended every year. Tetanus, diphtheria, and acellular pertussis (Tdap, Td) vaccine. You may need a Td booster every 10 years. Zoster vaccine. You may need this after age 54. Pneumococcal 13-valent conjugate (PCV13) vaccine. One dose is recommended after age 53. Pneumococcal polysaccharide (PPSV23) vaccine. One dose is recommended after age 57. Talk to your health care provider about which screenings and vaccines you need and how often you need them. This information is not intended to replace advice given to you by your health care provider. Make sure you discuss any questions you have with your health care provider. Document Released: 12/17/2015 Document Revised:  08/09/2016 Document Reviewed: 09/21/2015 Elsevier Interactive Patient Education  2017 [employer].  Fall Prevention in the Home Falls can cause injuries. They can happen to people of all ages. There are many things you can do to make your home safe and to help prevent falls. What can I do on the outside of my home? Regularly fix the edges of walkways and driveways and fix any cracks. Remove anything that might make you trip as you walk through a door, such as a raised step or threshold. Trim any bushes or trees on the path to your home. Use  bright outdoor lighting. Clear any walking paths of anything that might make someone trip, such as rocks or tools. Regularly check to see if handrails are loose or broken. Make sure that both sides of any steps have handrails. Any raised decks and porches should have guardrails on the edges. Have any leaves, snow, or ice cleared regularly. Use sand or salt on walking paths during winter. Clean up any spills in your garage right away. This includes oil or grease spills. What can I do in the bathroom? Use night lights. Install grab bars by the toilet and in the tub and shower. Do not use towel bars as grab bars. Use non-skid mats or decals in the tub or shower. If you need to sit down in the shower, use a plastic, non-slip stool. Keep the floor dry. Clean up any water that spills on the floor as soon as it happens. Remove soap buildup in the tub or shower regularly. Attach bath mats securely with double-sided non-slip rug tape. Do not have throw rugs and other things on the floor that can make you trip. What can I do in the bedroom? Use night lights. Make sure that you have a light by your bed that is easy to reach. Do not use any sheets or blankets that are too big for your bed. They should not hang down onto the floor. Have a firm chair that has side arms. You can use this for support while you get dressed. Do not have throw rugs and other things on the floor that can make you trip. What can I do in the kitchen? Clean up any spills right away. Avoid walking on wet floors. Keep items that you use a lot in easy-to-reach places. If you need to reach something above you, use a strong step stool that has a grab bar. Keep electrical cords out of the way. Do not use floor polish or wax that makes floors slippery. If you must use wax, use non-skid floor wax. Do not have throw rugs and other things on the floor that can make you trip. What can I do with my stairs? Do not leave any items on the  stairs. Make sure that there are handrails on both sides of the stairs and use them. Fix handrails that are broken or loose. Make sure that handrails are as long as the stairways. Check any carpeting to make sure that it is firmly attached to the stairs. Fix any carpet that is loose or worn. Avoid having throw rugs at the top or bottom of the stairs. If you do have throw rugs, attach them to the floor with carpet tape. Make sure that you have a light switch at the top of the stairs and the bottom of the stairs. If you do not have them, ask someone to add them for you. What else can I do to help prevent falls? Wear shoes  that: Do not have high heels. Have rubber bottoms. Are comfortable and fit you well. Are closed at the toe. Do not wear sandals. If you use a stepladder: Make sure that it is fully opened. Do not climb a closed stepladder. Make sure that both sides of the stepladder are locked into place. Ask someone to hold it for you, if possible. Clearly mark and make sure that you can see: Any grab bars or handrails. First and last steps. Where the edge of each step is. Use tools that help you move around (mobility aids) if they are needed. These include: Canes. Walkers. Scooters. Crutches. Turn on the lights when you go into a dark area. Replace any light bulbs as soon as they burn out. Set up your furniture so you have a clear path. Avoid moving your furniture around. If any of your floors are uneven, fix them. If there are any pets around you, be aware of where they are. Review your medicines with your doctor. Some medicines can make you feel dizzy. This can increase your chance of falling. Ask your doctor what other things that you can do to help prevent falls. This information is not intended to replace advice given to you by your health care provider. Make sure you discuss any questions you have with your health care provider. Document Released: 09/16/2009 Document Revised:  04/27/2016 Document Reviewed: 12/25/2014 Elsevier Interactive Patient Education  2017 [employer].

## 2023-06-01 ENCOUNTER — Ambulatory Visit (INDEPENDENT_AMBULATORY_CARE_PROVIDER_SITE_OTHER): Payer: PPO | Admitting: Family Medicine

## 2023-06-01 VITALS — BP 120/82 | HR 69 | Temp 99.1°F | Ht 65.0 in | Wt 176.4 lb

## 2023-06-01 DIAGNOSIS — Z7984 Long term (current) use of oral hypoglycemic drugs: Secondary | ICD-10-CM | POA: Diagnosis not present

## 2023-06-01 DIAGNOSIS — Z170 Estrogen receptor positive status [ER+]: Secondary | ICD-10-CM

## 2023-06-01 DIAGNOSIS — M25571 Pain in right ankle and joints of right foot: Secondary | ICD-10-CM | POA: Diagnosis not present

## 2023-06-01 DIAGNOSIS — C50211 Malignant neoplasm of upper-inner quadrant of right female breast: Secondary | ICD-10-CM

## 2023-06-01 DIAGNOSIS — Z0000 Encounter for general adult medical examination without abnormal findings: Secondary | ICD-10-CM

## 2023-06-01 DIAGNOSIS — E785 Hyperlipidemia, unspecified: Secondary | ICD-10-CM

## 2023-06-01 DIAGNOSIS — I152 Hypertension secondary to endocrine disorders: Secondary | ICD-10-CM | POA: Diagnosis not present

## 2023-06-01 DIAGNOSIS — E1159 Type 2 diabetes mellitus with other circulatory complications: Secondary | ICD-10-CM

## 2023-06-01 LAB — LIPID PANEL
Cholesterol: 139 mg/dL (ref 0–200)
HDL: 46.1 mg/dL (ref >39.00)
LDL Cholesterol: 75 mg/dL (ref 0–99)
NonHDL: 93.14
Total CHOL/HDL Ratio: 3
Triglycerides: 93 mg/dL (ref 0.0–149.0)
VLDL: 18.6 mg/dL (ref 0.0–40.0)

## 2023-06-01 LAB — COMPREHENSIVE METABOLIC PANEL
ALT: 33 U/L (ref 0–35)
AST: 28 U/L (ref 0–37)
Albumin: 4.2 g/dL (ref 3.5–5.2)
Alkaline Phosphatase: 65 U/L (ref 39–117)
BUN: 18 mg/dL (ref 6–23)
CO2: 31 mEq/L (ref 19–32)
Calcium: 9.5 mg/dL (ref 8.4–10.5)
Chloride: 101 mEq/L (ref 96–112)
Creatinine, Ser: 0.92 mg/dL (ref 0.40–1.20)
GFR: 62.89 mL/min (ref >60.00)
Glucose, Bld: 128 mg/dL — ABNORMAL HIGH (ref 70–99)
Potassium: 3.6 mEq/L (ref 3.5–5.1)
Sodium: 141 mEq/L (ref 135–145)
Total Bilirubin: 0.9 mg/dL (ref 0.2–1.2)
Total Protein: 6.9 g/dL (ref 6.0–8.3)

## 2023-06-01 LAB — POCT GLYCOSYLATED HEMOGLOBIN (HGB A1C): Hemoglobin A1C: 6.8 % — AB (ref 4.0–5.6)

## 2023-06-01 NOTE — Patient Instructions (Signed)
Please stop at the lab to have labs drawn.  Can try voltaren four time daily for ankle pain, Start range of motion exercises left ankle.

## 2023-06-01 NOTE — Assessment & Plan Note (Signed)
Acute, no known injury Patient does walk a lot and had recent trip to the beach. Most consistent with osteoarthritis flare versus tendinopathy, less likely medial ankle sprain. Recommended topical Voltaren gel 3-4 times daily and home range of motion physical therapy.

## 2023-06-01 NOTE — Assessment & Plan Note (Signed)
Stable, chronic.  Continue current medication.  HCTZ 25 mg daily 

## 2023-06-01 NOTE — Assessment & Plan Note (Signed)
Stable, chronic.  Continue current medication.  Metformin XR 500 mg 2 tablets daily

## 2023-06-01 NOTE — Assessment & Plan Note (Signed)
Due for re-evaluation on 40 mg daily pravastatin.  

## 2023-06-01 NOTE — Progress Notes (Signed)
Patient ID: Bridget Cummings, female    DOB: [birth year], 72 y.o.   MRN: [id number]  This visit was conducted in person.  BP 120/82 (BP Location: Left Arm, Patient Position: Sitting, Cuff Size: Normal)   Pulse 69   Temp 99.1 F (37.3 C) (Temporal)   Ht 5\' 5"  (1.651 m)   Wt 176 lb 6 oz (80 kg)   SpO2 97%   BMI 29.35 kg/m    CC: Chief Complaint  Patient presents with   Annual Exam    Part 2    Subjective:   HPI: Bridget Cummings is a 72 y.o. female presenting on 06/01/2023 for Annual Exam (Part 2)  The patient presents for complete physical and review of chronic health problems. He/She also has the following acute concerns today:   Left ankle soreness medial.  No injury.  The patient saw a LPN or RN for medicare wellness visit. 05/28/2023  Prevention and wellness was reviewed in detail. Note reviewed and important notes copied below.  Hypertension:  Good control on hydrochlorothiazide 25 mg p.o. daily. BP Readings from Last 3 Encounters:  06/01/23 120/82  03/12/23 (!) 162/82  02/16/23 120/70  Using medication without problems or lightheadedness:  none Chest pain with exertion: none Edema: none Short of breath: none Average home BPs: Other issues:  Elevated Cholesterol: Due for reevaluation Lab Results  Component Value Date   CHOL 197 11/14/2022   HDL 52.00 11/14/2022   LDLCALC 116 (H) 11/14/2022   LDLDIRECT 140.2 09/30/2013   TRIG 143.0 11/14/2022   CHOLHDL 4 11/14/2022  Using medications without problems: Muscle aches:  Diet compliance: moderate Exercise: good Other complaints:   Hx of breast cancer followed by Dr. Smith Robert. S/p Arimidex,  on year 4/5.    Diabetes: Well-controlled on metformin 1000 mg p.o. daily XR with  food is slightly better diarrhea, tolerable. Lab Results  Component Value Date   HGBA1C 6.8 (A) 06/01/2023  Using medications without difficulties: Hypoglycemic episodes: Hyperglycemic episodes: Feet problems: none Blood Sugars  averaging: eye exam within last year:   Relevant past medical, surgical, family and social history reviewed and updated as indicated. Interim medical history since our last visit reviewed. Allergies and medications reviewed and updated. Outpatient Medications Prior to Visit  Medication Sig Dispense Refill   fluocinonide cream (LIDEX) 0.05 % Apply 1 Application topically 2 (two) times daily as needed.     metFORMIN (GLUCOPHAGE-XR) 500 MG 24 hr tablet Take 1,000 mg by mouth daily with breakfast.     anastrozole (ARIMIDEX) 1 MG tablet TAKE 1 TABLET(1 MG) BY MOUTH DAILY 90 tablet 3   aspirin 81 MG tablet Take 81 mg by mouth at bedtime.     atorvastatin (LIPITOR) 20 MG tablet Take 1 tablet (20 mg total) by mouth daily. 90 tablet 3   Blood Glucose Monitoring Suppl (ONETOUCH VERIO REFLECT) w/Device KIT USE TO CHECK BLOOD SUGAR AS DIRECTED.     Calcium Carb-Cholecalciferol (CALCIUM + VITAMIN D3 PO) Take 2 tablets by mouth daily.     hydrochlorothiazide (HYDRODIURIL) 25 MG tablet TAKE 1 TABLET(25 MG) BY MOUTH DAILY 90 tablet 3   Lancets (ONETOUCH DELICA PLUS LANCET33G) MISC Apply topically daily.     ONETOUCH VERIO test strip daily.     metFORMIN (GLUCOPHAGE) 850 MG tablet 1 tablet  po twice daily 180 tablet 3   Omega-3 Fatty Acids (FISH OIL) 1000 MG CAPS Take 2 capsules by mouth daily. (Patient not taking: Reported on 05/28/2023)  vitamin B-12 (CYANOCOBALAMIN) 1000 MCG tablet Take 1,000 mcg by mouth daily. (Patient not taking: Reported on 05/28/2023)     No facility-administered medications prior to visit.     Per HPI unless specifically indicated in ROS section below Review of Systems  Constitutional:  Negative for fatigue and fever.  HENT:  Negative for congestion.   Eyes:  Negative for pain.  Respiratory:  Negative for cough and shortness of breath.   Cardiovascular:  Negative for chest pain, palpitations and leg swelling.  Gastrointestinal:  Negative for abdominal pain.  Genitourinary:   Negative for dysuria and vaginal bleeding.  Musculoskeletal:  Negative for back pain.  Neurological:  Negative for syncope, light-headedness and headaches.  Psychiatric/Behavioral:  Negative for dysphoric mood.    Objective:  BP 120/82 (BP Location: Left Arm, Patient Position: Sitting, Cuff Size: Normal)   Pulse 69   Temp 99.1 F (37.3 C) (Temporal)   Ht 5\' 5"  (1.651 m)   Wt 176 lb 6 oz (80 kg)   SpO2 97%   BMI 29.35 kg/m   Wt Readings from Last 3 Encounters:  06/01/23 176 lb 6 oz (80 kg)  05/28/23 170 lb (77.1 kg)  03/12/23 178 lb (80.7 kg)      Physical Exam Vitals and nursing note reviewed.  Constitutional:      General: She is not in acute distress.    Appearance: Normal appearance. She is well-developed. She is not ill-appearing or toxic-appearing.  HENT:     Head: Normocephalic.     Right Ear: Hearing, tympanic membrane, ear canal and external ear normal.     Left Ear: Hearing, tympanic membrane, ear canal and external ear normal.     Nose: Nose normal.  Eyes:     General: Lids are normal. Lids are everted, no foreign bodies appreciated.     Conjunctiva/sclera: Conjunctivae normal.     Pupils: Pupils are equal, round, and reactive to light.  Neck:     Thyroid: No thyroid mass or thyromegaly.     Vascular: No carotid bruit.     Trachea: Trachea normal.  Cardiovascular:     Rate and Rhythm: Normal rate and regular rhythm.     Heart sounds: Normal heart sounds, S1 normal and S2 normal. No murmur heard.    No gallop.  Pulmonary:     Effort: Pulmonary effort is normal. No respiratory distress.     Breath sounds: Normal breath sounds. No wheezing, rhonchi or rales.  Abdominal:     General: Bowel sounds are normal. There is no distension or abdominal bruit.     Palpations: Abdomen is soft. There is no fluid wave or mass.     Tenderness: There is no abdominal tenderness. There is no guarding or rebound.     Hernia: No hernia is present.  Musculoskeletal:     Cervical  back: Normal range of motion and neck supple.     Right ankle: Tenderness present over the medial malleolus. No lateral malleolus, ATF ligament, AITF ligament, posterior TF ligament, base of 5th metatarsal or proximal fibula tenderness. Normal range of motion. Anterior drawer test negative. Normal pulse.     Right Achilles Tendon: Normal.     Left ankle:     Left Achilles Tendon: Normal.  Lymphadenopathy:     Cervical: No cervical adenopathy.  Skin:    General: Skin is warm and dry.     Findings: No rash.  Neurological:     Mental Status: She is alert.  Cranial Nerves: No cranial nerve deficit.     Sensory: No sensory deficit.  Psychiatric:        Mood and Affect: Mood is not anxious or depressed.        Speech: Speech normal.        Behavior: Behavior normal. Behavior is cooperative.        Judgment: Judgment normal.       Results for orders placed or performed in visit on 06/01/23  POCT glycosylated hemoglobin (Hb A1C)  Result Value Ref Range   Hemoglobin A1C 6.8 (A) 4.0 - 5.6 %   HbA1c POC (<> result, manual entry)     HbA1c, POC (prediabetic range)     HbA1c, POC (controlled diabetic range)       COVID 19 screen:  No recent travel or known exposure to COVID19 The patient denies respiratory symptoms of COVID 19 at this time. The importance of social distancing was discussed today.   Assessment and Plan   The patient's preventative maintenance and recommended screening tests for an annual wellness exam were reviewed in full today. Brought up to date unless services declined.  Counselled on the importance of diet, exercise, and its role in overall health and mortality. The patient's FH and SH was reviewed, including their home life, tobacco status, and drug and alcohol status.   Vaccines: uptodate. S/P  COVID vaccine, Shingrix, she just got Td 05/25/2022 screening mammogram...per onc,  hx of breast cancer neg 10/2022 PAP/DVE: no ovaries due to hx of breast cancer, has  uterus.  last done 2017.. Pap no longer indicated. No DVE indicated, asymptomatic. Colonoscopy 11/2019 Dr. Kinnie Scales, repeat in 10 years.  She has had 3 hemorrhoid banding. DEXA;  2019 nml, 10/2021 T -2.3, 01/2023 T -2.3.Marland Kitchen she is on Arimidex.  Refused HIV testing.  Hep C: neg  Nonsmoker, ETOH wine rarely  Problem List Items Addressed This Visit     Acute right ankle pain    Acute, no known injury Patient does walk a lot and had recent trip to the beach. Most consistent with osteoarthritis flare versus tendinopathy, less likely medial ankle sprain. Recommended topical Voltaren gel 3-4 times daily and home range of motion physical therapy.      Hyperlipidemia LDL goal <130    Due for re-evaluation on 40 mg daily pravastatin.       Relevant Orders   Lipid panel   Comprehensive metabolic panel   Hypertension associated with diabetes (HCC)    Stable, chronic.  Continue current medication.   HCTZ 25 mg daily.      Relevant Medications   metFORMIN (GLUCOPHAGE-XR) 500 MG 24 hr tablet   Malignant neoplasm of upper-inner quadrant of right breast in female, estrogen receptor positive (HCC)    Hx of breast cancer followed by Dr. Smith Robert. She is on Arimidex,  on year 4/5.      Type 2 diabetes mellitus with other circulatory complications (HCC)    Stable, chronic.  Continue current medication.  Metformin XR 500 mg 2 tablets daily      Relevant Medications   metFORMIN (GLUCOPHAGE-XR) 500 MG 24 hr tablet   Other Relevant Orders   POCT glycosylated hemoglobin (Hb A1C) (Completed)   Other Visit Diagnoses     Routine general medical examination at a health care facility    -  Primary       Kerby Nora, MD

## 2023-06-01 NOTE — Assessment & Plan Note (Signed)
Hx of breast cancer followed by Dr. Smith Robert. She is on Arimidex,  on year 4/5.

## 2023-06-01 NOTE — Assessment & Plan Note (Deleted)
Hx of breast cancer followed by Dr. Smith Robert.  In [place] treatment with Arimidex,  on year 4/5.

## 2023-07-22 IMAGING — MG MM DIGITAL SCREENING BILAT W/ TOMO AND CAD
6 of 10 series · 6 of 30 positions shown · non-contrast
Comparison: Previous exam(s).

CLINICAL DATA: Screening.

EXAM:
DIGITAL SCREENING BILATERAL MAMMOGRAM WITH TOMOSYNTHESIS AND CAD
TECHNIQUE: Bilateral screening digital craniocaudal and mediolateral oblique
mammograms were obtained. Bilateral screening digital breast
tomosynthesis was performed. The images were evaluated with
computer-aided detection.

[L XCCL synth-2D]
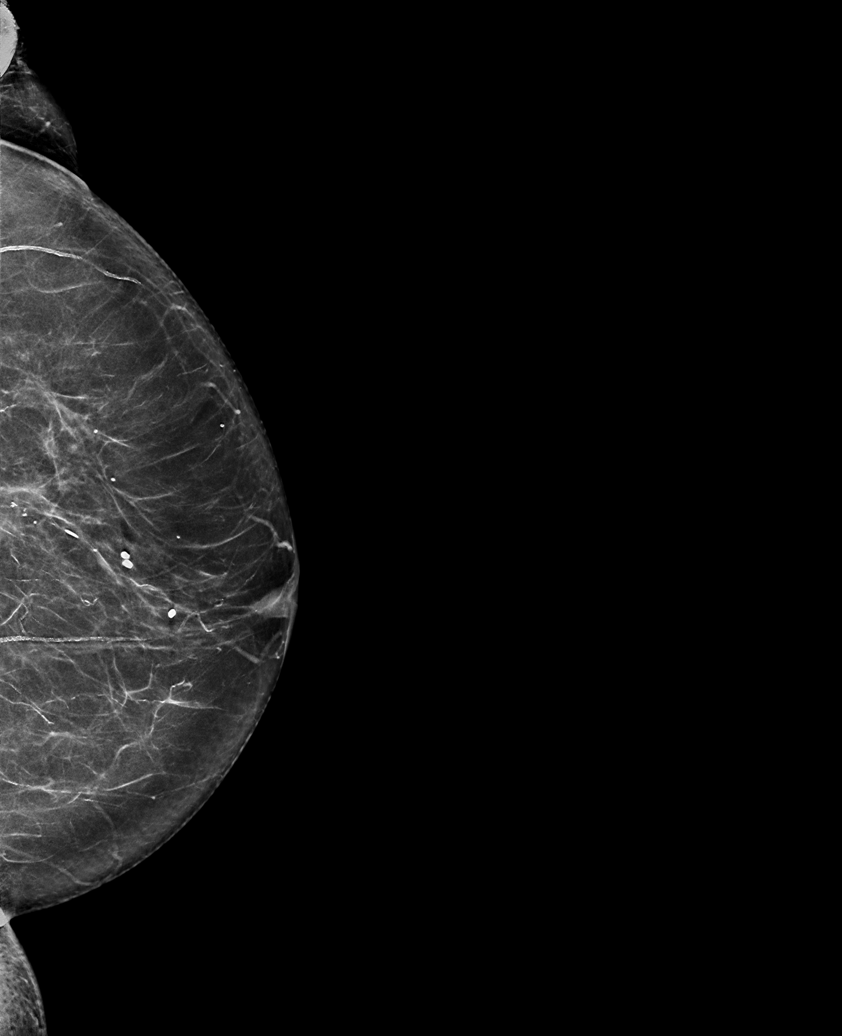

[R MLO synth-2D]
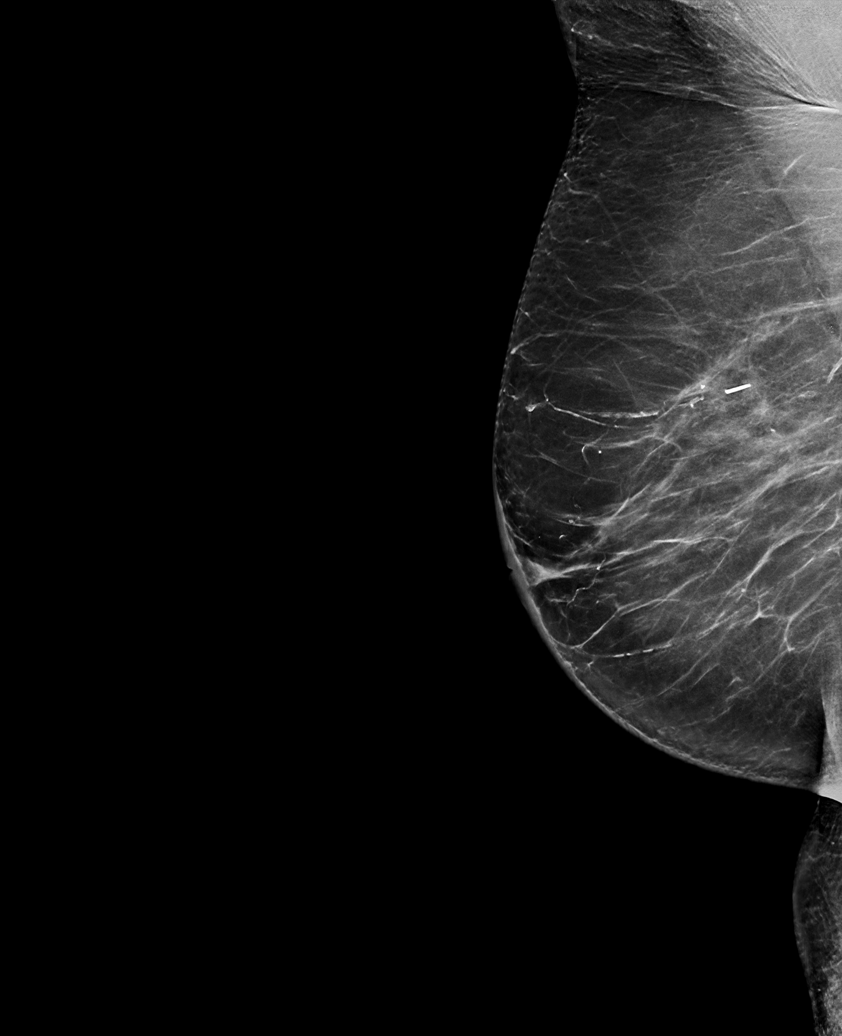

[L CC synth-2D]
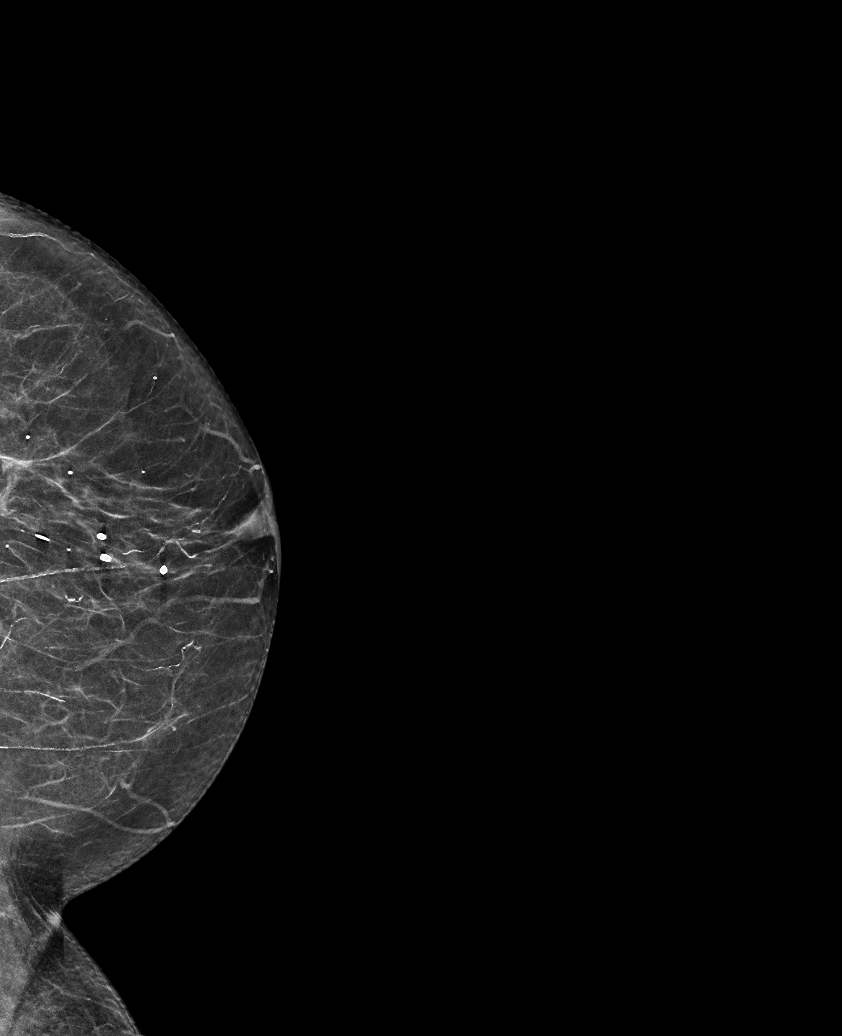

[R CC synth-2D]
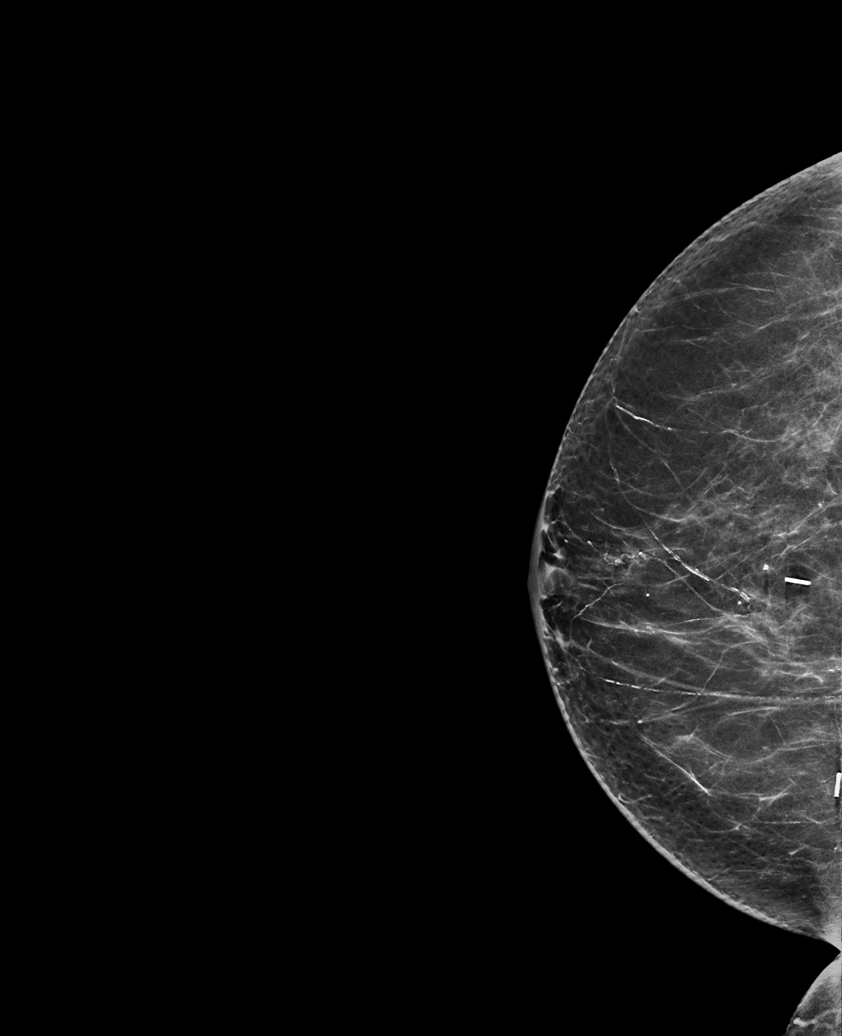

[L MLO synth-2D]
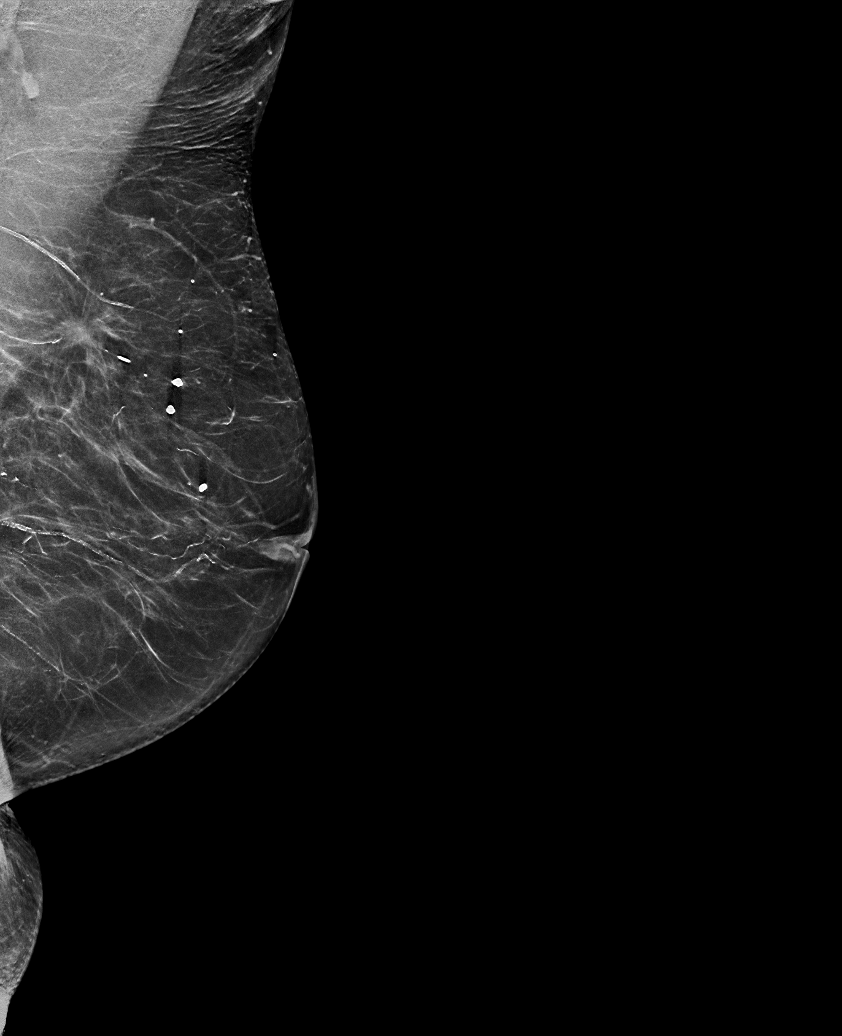

[L CC tomo · tomo slice 29/57.0]
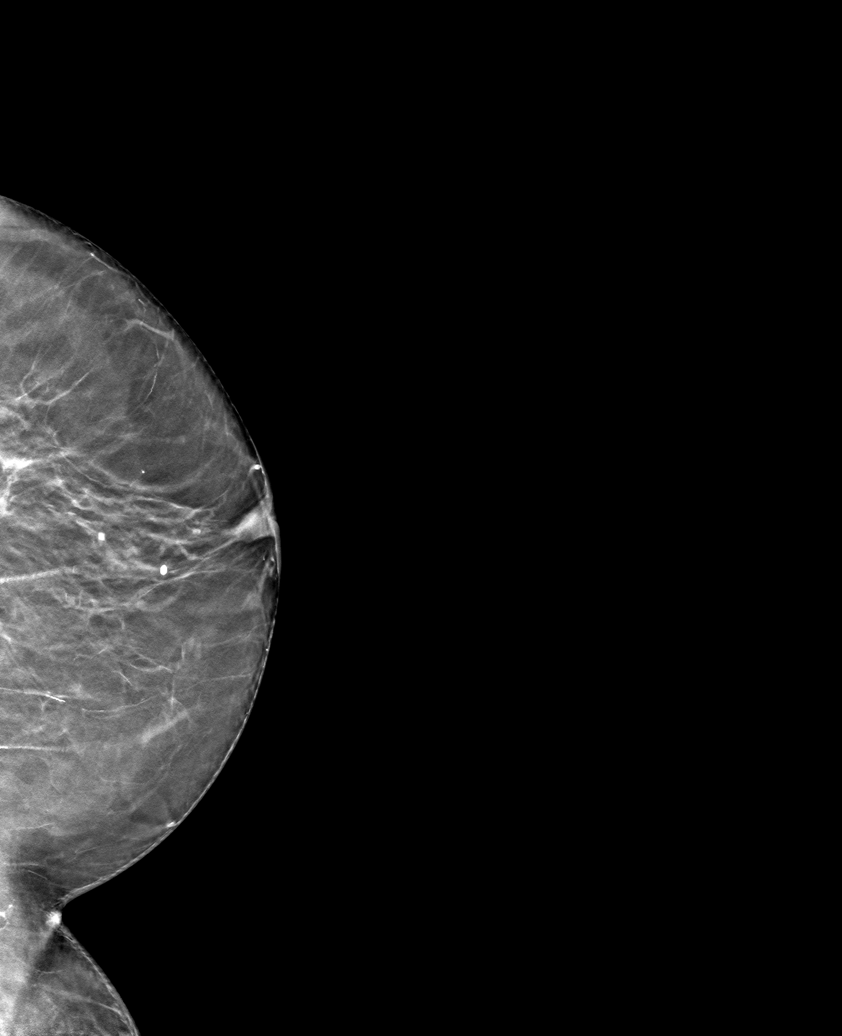

[6 of 30 positions shown; findings below may reference images not displayed]

ACR Breast Density Category b: There are scattered areas of
fibroglandular density.
FINDINGS: There are no findings suspicious for malignancy.
IMPRESSION: No mammographic evidence of malignancy. A result letter of this
screening mammogram will be mailed directly to the patient.

RECOMMENDATION:
Screening mammogram in one year. (Code:51-O-LD2)

BI-RADS CATEGORY  1: Negative.

## 2023-08-01 ENCOUNTER — Encounter: Payer: Self-pay | Admitting: Oncology

## 2023-08-01 ENCOUNTER — Inpatient Hospital Stay: Payer: PPO | Attending: Oncology | Admitting: Oncology

## 2023-08-01 VITALS — BP 144/74 | HR 97 | Temp 99.7°F | Resp 18 | Ht 65.0 in | Wt 181.8 lb

## 2023-08-01 DIAGNOSIS — Z170 Estrogen receptor positive status [ER+]: Secondary | ICD-10-CM | POA: Diagnosis not present

## 2023-08-01 DIAGNOSIS — Z90722 Acquired absence of ovaries, bilateral: Secondary | ICD-10-CM | POA: Diagnosis not present

## 2023-08-01 DIAGNOSIS — Z853 Personal history of malignant neoplasm of breast: Secondary | ICD-10-CM | POA: Insufficient documentation

## 2023-08-01 DIAGNOSIS — Z08 Encounter for follow-up examination after completed treatment for malignant neoplasm: Secondary | ICD-10-CM

## 2023-08-01 DIAGNOSIS — M85831 Other specified disorders of bone density and structure, right forearm: Secondary | ICD-10-CM | POA: Diagnosis not present

## 2023-08-01 DIAGNOSIS — Z923 Personal history of irradiation: Secondary | ICD-10-CM

## 2023-08-01 DIAGNOSIS — C50911 Malignant neoplasm of unspecified site of right female breast: Secondary | ICD-10-CM | POA: Diagnosis not present

## 2023-08-01 DIAGNOSIS — Z79811 Long term (current) use of aromatase inhibitors: Secondary | ICD-10-CM

## 2023-08-01 DIAGNOSIS — Z5181 Encounter for therapeutic drug level monitoring: Secondary | ICD-10-CM

## 2023-08-01 NOTE — Progress Notes (Signed)
Hematology/Oncology Consult note [facility]  Telephone:([phone] [phone]  Patient Care Team: Excell Seltzer, MD as PCP - General Magrinat, Valentino Hue, MD (Inactive) as Consulting Physician (Oncology) Claud Kelp, MD as Consulting Physician (General Surgery) Carmina Miller, MD as Referring Physician (Radiation Oncology) Jeralyn Ruths, MD as Consulting Physician (Oncology) Creig Hines, MD as Consulting Physician (Oncology)   Name of the patient: Bridget Cummings  [id number]  [birth year]   Date of visit: 08/01/23  Diagnosis-  invasive mammary carcinoma of the right breast pathological prognostic stage IA pT1 ppN0 cM0, grade 1 ER PR positive HER-2/neu negative status post lumpectomy and adjuvant radiation treatment   Chief complaint/ Reason for visit-routine follow-up of breast cancer  Heme/Onc history: patient is a 72 year old female who was diagnosed with left breast cancer in November 2008.  Pathology at that time showed a 1.7 cm DCIS that was ER positive.  She underwent external beam radiation and took tamoxifen for 4 years.  Patient found to have stage I invasive mammary carcinoma ER/PR positive HER-2/neu -8 mm grade 1 in the right breast s/p lumpectomy and adjuvant radiation treatment in 2019.  She did not require adjuvant chemotherapy.  She completed adjuvant radiation therapy and started on Arimidex in February 2020.  She is s/p bilateral oophorectomy in 2009 and has an intact uterus.    Interval history-patient is tolerating Arimidex well.  Denies any breast concerns.  Denies any new aches and pains anywhere.  ECOG PS- 0 Pain scale- 0   Review of systems- Review of Systems  Constitutional:  Negative for chills, fever, malaise/fatigue and weight loss.  HENT:  Negative for congestion, ear discharge and nosebleeds.   Eyes:  Negative for blurred vision.  Respiratory:  Negative for cough, hemoptysis, sputum production,  shortness of breath and wheezing.   Cardiovascular:  Negative for chest pain, palpitations, orthopnea and claudication.  Gastrointestinal:  Negative for abdominal pain, blood in stool, constipation, diarrhea, heartburn, melena, nausea and vomiting.  Genitourinary:  Negative for dysuria, flank pain, frequency, hematuria and urgency.  Musculoskeletal:  Negative for back pain, joint pain and myalgias.  Skin:  Negative for rash.  Neurological:  Negative for dizziness, tingling, focal weakness, seizures, weakness and headaches.  Endo/Heme/Allergies:  Does not bruise/bleed easily.  Psychiatric/Behavioral:  Negative for depression and suicidal ideas. The patient does not have insomnia.       Allergies  Allergen Reactions   Peppermint Flavor     sneezing     Past Medical History:  Diagnosis Date   Allergy    Breast cancer (HCC) 2008   Left Breast Cancer   Breast cancer (HCC) 2019   Right Breast Cancer   Diabetes mellitus without complication (HCC)    Family history of breast cancer    Hyperlipidemia    Personal history of radiation therapy 2008   Left Breast Cancer   Personal history of radiation therapy 2019   Right Breast Cancer     Past Surgical History:  Procedure Laterality Date   BREAST BIOPSY     BREAST LUMPECTOMY Left 2008   BREAST LUMPECTOMY Right 11/04/2018   BREAST LUMPECTOMY WITH RADIOACTIVE SEED AND SENTINEL LYMPH NODE BIOPSY Right 11/04/2018   Procedure: RIGHT BREAST LUMPECTOMY WITH RADIOACTIVE SEED AND RIGHT AXILLARY SENTINEL LYMPH NODE BIOPSY;  Surgeon: Claud Kelp, MD;  Location: [facility] OR;  Service: General;  Laterality: Right;   FOOT SURGERY     x5 total   OOPHORECTOMY  OVARY REMOVED  2009   TONSILLECTOMY      Social History   Socioeconomic History   Marital status: Married    Spouse name: Not on file   Number of children: 2   Years of education: Not on file   Highest education level: Not on file  Occupational History   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     Employer: [employer]  Tobacco Use   Smoking status: Never   Smokeless tobacco: Never  Vaping Use   Vaping status: Never Used  Substance and Sexual Activity   Alcohol use: No   Drug use: No   Sexual activity: Yes  Other Topics Concern   Not on file  Social History Narrative   Regular exercise-yes, walks 45 minutes every day   Diet: fruit and veggies and chicken avoiding salt   Social Determinants of Health   Financial Resource Strain: Low Risk  (05/28/2023)   Overall Financial Resource Strain (CARDIA)    Difficulty of Paying Living Expenses: Not hard at all  Food Insecurity: No Food Insecurity (05/28/2023)   Hunger Vital Sign    Worried About Running Out of Food in the Last Year: Never true    Ran Out of Food in the Last Year: Never true  Transportation Needs: No Transportation Needs (05/28/2023)   PRAPARE - Administrator, Civil Service (Medical): No    Lack of Transportation (Non-Medical): No  Physical Activity: Sufficiently Active (05/28/2023)   Exercise Vital Sign    Days of Exercise per Week: 3 days    Minutes of Exercise per Session: 60 min  Stress: No Stress Concern Present (05/28/2023)   [employer] of Occupational Health - Occupational Stress Questionnaire    Feeling of Stress : Not at all  Social Connections: Moderately Integrated (05/28/2023)   Social Connection and Isolation Panel [NHANES]    Frequency of Communication with Friends and Family: More than three times a week    Frequency of Social Gatherings with Friends and Family: More than three times a week    Attends Religious Services: Never    Database administrator or Organizations: Yes    Attends Engineer, structural: More than 4 times per year    Marital Status: Married  Catering manager Violence: Not At Risk (05/28/2023)   Humiliation, Afraid, Rape, and Kick questionnaire    Fear of Current or Ex-Partner: No    Emotionally Abused: No    Physically Abused: No     Sexually Abused: No    Family History  Problem Relation Age of Onset   Breast cancer Mother 32   Diabetes Father    Breast cancer Sister 50   Diabetes Brother    Breast cancer Maternal Aunt 72   Heart attack Maternal Grandmother 58   Breast cancer Cousin        are dx unk     Current Outpatient Medications:    anastrozole (ARIMIDEX) 1 MG tablet, TAKE 1 TABLET(1 MG) BY MOUTH DAILY, Disp: 90 tablet, Rfl: 3   aspirin 81 MG tablet, Take 81 mg by mouth at bedtime., Disp: , Rfl:    atorvastatin (LIPITOR) 20 MG tablet, Take 1 tablet (20 mg total) by mouth daily., Disp: 90 tablet, Rfl: 3   Blood Glucose Monitoring Suppl (ONETOUCH VERIO REFLECT) w/Device KIT, USE TO CHECK BLOOD SUGAR AS DIRECTED., Disp: , Rfl:    Calcium Carb-Cholecalciferol (CALCIUM + VITAMIN D3 PO), Take 2 tablets by mouth daily., Disp: , Rfl:  fluocinonide cream (LIDEX) 0.05 %, Apply 1 Application topically 2 (two) times daily as needed., Disp: , Rfl:    hydrochlorothiazide (HYDRODIURIL) 25 MG tablet, TAKE 1 TABLET(25 MG) BY MOUTH DAILY, Disp: 90 tablet, Rfl: 3   Lancets (ONETOUCH DELICA PLUS LANCET33G) MISC, Apply topically daily., Disp: , Rfl:    metFORMIN (GLUCOPHAGE-XR) 500 MG 24 hr tablet, Take 1,000 mg by mouth daily with breakfast., Disp: , Rfl:    ONETOUCH VERIO test strip, daily., Disp: , Rfl:   Physical exam:  Vitals:   08/01/23 1316  BP: (!) 144/74  Pulse: 97  Resp: 18  Temp: 99.7 F (37.6 C)  TempSrc: Tympanic  SpO2: 98%  Weight: 181 lb 12.8 oz (82.5 kg)  Height: 5\' 5"  (1.651 m)   Physical Exam Cardiovascular:     Rate and Rhythm: Normal rate and regular rhythm.     Heart sounds: Normal heart sounds.  Pulmonary:     Effort: Pulmonary effort is normal.     Breath sounds: Normal breath sounds.  Skin:    General: Skin is warm and dry.  Neurological:     Mental Status: She is alert and oriented to person, place, and time.    Breast exam was performed in seated and lying down position. Patient  is status post right lumpectomy with a well-healed surgical scar. No evidence of any palpable masses. No evidence of axillary adenopathy. No evidence of any palpable masses or lumps in the left breast. No evidence of leftt axillary adenopathy      Latest Ref Rng & Units 06/01/2023   11:45 AM  CMP  Glucose 70 - 99 mg/dL 098   BUN 6 - 23 mg/dL 18   Creatinine 1.19 - 1.20 mg/dL 1.47   Sodium 829 - 562 mEq/L 141   Potassium 3.5 - 5.1 mEq/L 3.6   Chloride 96 - 112 mEq/L 101   CO2 19 - 32 mEq/L 31   Calcium 8.4 - 10.5 mg/dL 9.5   Total Protein 6.0 - 8.3 g/dL 6.9   Total Bilirubin 0.2 - 1.2 mg/dL 0.9   Alkaline Phos 39 - 117 U/L 65   AST 0 - 37 U/L 28   ALT 0 - 35 U/L 33       Latest Ref Rng & Units 07/31/2022   12:41 PM  CBC  WBC 4.0 - 10.5 K/uL 7.7   Hemoglobin 12.0 - 15.0 g/dL 13.0   Hematocrit 86.5 - 46.0 % 45.0   Platelets 150 - 400 K/uL 276      Assessment and plan- Patient is a 72 y.o. female with  invasive mammary carcinoma of the right breast pathological prognostic stage IA pT1 pN0 cM0, grade 1 ER PR positive HER-2/neu negative status post lumpectomy and adjuvant radiation treatment.  This is a routine follow-up visit  Patient will be completing 5 years of endocrine therapy in February 2025.  She is tolerating Arimidex well without any significant side effects.  Clinically she is doing well with no signs and symptoms of recurrence based on today's exam.  I will see her back in 6 months no labs.  She was noted to haveOsteopenia of the right forearm based on her bone density scan February 2024 but she does not require any adjuvant bisphosphonates at this time.  10-year probability of a major osteoporotic fracture was less than 20% and hip fracture was less than 3%.   Visit Diagnosis 1. Encounter for follow-up surveillance of breast cancer   2. Visit for monitoring  Arimidex therapy      Dr. Owens Shark, MD, MPH [facility] [phone] 08/01/2023 4:13 PM

## 2023-08-21 ENCOUNTER — Other Ambulatory Visit: Payer: Self-pay | Admitting: Family Medicine

## 2023-09-10 ENCOUNTER — Other Ambulatory Visit: Payer: Self-pay | Admitting: Oncology

## 2023-09-10 ENCOUNTER — Other Ambulatory Visit: Payer: Self-pay | Admitting: Family Medicine

## 2023-10-22 ENCOUNTER — Ambulatory Visit
Admission: RE | Admit: 2023-10-22 | Discharge: 2023-10-22 | Disposition: A | Discharge status: Home / Self Care | Payer: PPO | Source: Ambulatory Visit | Attending: Family Medicine | Admitting: Family Medicine

## 2023-10-22 DIAGNOSIS — Z1231 Encounter for screening mammogram for malignant neoplasm of breast: Secondary | ICD-10-CM | POA: Insufficient documentation

## 2023-12-07 ENCOUNTER — Ambulatory Visit (INDEPENDENT_AMBULATORY_CARE_PROVIDER_SITE_OTHER): Payer: PPO | Admitting: Family Medicine

## 2023-12-07 VITALS — BP 130/80 | HR 90 | Temp 98.1°F | Ht 65.0 in | Wt 181.2 lb

## 2023-12-07 DIAGNOSIS — E1159 Type 2 diabetes mellitus with other circulatory complications: Secondary | ICD-10-CM | POA: Diagnosis not present

## 2023-12-07 DIAGNOSIS — I152 Hypertension secondary to endocrine disorders: Secondary | ICD-10-CM

## 2023-12-07 DIAGNOSIS — Z6830 Body mass index (BMI) 30.0-30.9, adult: Secondary | ICD-10-CM

## 2023-12-07 DIAGNOSIS — E66811 Obesity, class 1: Secondary | ICD-10-CM | POA: Diagnosis not present

## 2023-12-07 DIAGNOSIS — E6609 Other obesity due to excess calories: Secondary | ICD-10-CM | POA: Diagnosis not present

## 2023-12-07 DIAGNOSIS — Z7984 Long term (current) use of oral hypoglycemic drugs: Secondary | ICD-10-CM

## 2023-12-07 LAB — POCT GLYCOSYLATED HEMOGLOBIN (HGB A1C): Hemoglobin A1C: 7.6 % — AB (ref 4.0–5.6)

## 2023-12-07 LAB — HM DIABETES FOOT EXAM

## 2023-12-07 LAB — MICROALBUMIN / CREATININE URINE RATIO
Creatinine,U: 246.8 mg/dL
Microalb Creat Ratio: 1.4 mg/g (ref 0.0–30.0)
Microalb, Ur: 3.5 mg/dL — ABNORMAL HIGH (ref 0.0–1.9)

## 2023-12-07 MED ORDER — METFORMIN HCL ER 500 MG PO TB24: 1000.0000 mg | ORAL_TABLET | Freq: Every day | ORAL | 1 refills | Status: DC

## 2023-12-07 NOTE — Assessment & Plan Note (Signed)
Stable, chronic.  Continue current medication.  HCTZ 25 mg daily 

## 2023-12-07 NOTE — Assessment & Plan Note (Signed)
 Encouraged exercise, weight loss, healthy eating habits. ? ?

## 2023-12-07 NOTE — Assessment & Plan Note (Signed)
 Slight worsening over holidays, chronic.  Continue current medication but get back on track with lifestyle changes.  Metformin XR 500 mg 2 tablets daily

## 2023-12-07 NOTE — Progress Notes (Signed)
 Patient ID: Bridget Cummings, female    DOB: [birth year], 73 y.o.   MRN: [id number]  This visit was conducted in person.  BP 130/80 (BP Location: Right Arm, Patient Position: Sitting, Cuff Size: Large)   Pulse 90   Temp 98.1 F (36.7 C) (Temporal)   Ht 5' 5 (1.651 m)   Wt 181 lb 4 oz (82.2 kg)   SpO2 95%   BMI 30.16 kg/m    CC:  Chief Complaint  Patient presents with   Diabetes    Subjective:   HPI: Bridget Cummings is a 73 y.o. female presenting on 12/07/2023 for Diabetes  Diabetes: Previously well-controlled on metformin  XR 500 mg 2 tablets daily. Now trending back up after the holidays/eating more more mints.  Lab Results  Component Value Date   HGBA1C 7.6 (A) 12/07/2023  Using medications without difficulties:  some intermittent diarrhea, wishes to decrease dose Hypoglycemic episodes: Hyperglycemic episodes: Feet problems: none Blood Sugars averaging: eye exam within last year:  due Due For microalbumin  Hypertension:  Well-controlled on hydrochlorothiazide  25 mg p.o. daily BP Readings from Last 3 Encounters:  12/07/23 130/80  08/01/23 (!) 144/74  06/01/23 120/82  Using medication without problems or lightheadedness:  none Chest pain with exertion: none Edema: none Short of breath:none Average home BPs: 120/70s Other issues:    Will be off  anastrozole  in 01/2024  Relevant past medical, surgical, family and social history reviewed and updated as indicated. Interim medical history since our last visit reviewed. Allergies and medications reviewed and updated. Outpatient Medications Prior to Visit  Medication Sig Dispense Refill   anastrozole  (ARIMIDEX ) 1 MG tablet Take 1 tablet (1 mg total) by mouth daily. 90 tablet 1   aspirin 81 MG tablet Take 81 mg by mouth at bedtime.     atorvastatin  (LIPITOR) 20 MG tablet TAKE 1 TABLET(20 MG) BY MOUTH DAILY 90 tablet 0   Blood Glucose Monitoring Suppl (ONETOUCH VERIO REFLECT) w/Device KIT USE TO CHECK BLOOD  SUGAR AS DIRECTED.     Calcium  Carb-Cholecalciferol (CALCIUM  + VITAMIN D3 PO) Take 2 tablets by mouth daily.     fluocinonide cream (LIDEX) 0.05 % Apply 1 Application topically 2 (two) times daily as needed.     glucose blood (ONETOUCH VERIO) test strip CHECK BLOOD SUGAR DAILY 100 strip 3   hydrochlorothiazide  (HYDRODIURIL ) 25 MG tablet TAKE 1 TABLET(25 MG) BY MOUTH DAILY 90 tablet 3   Lancets (ONETOUCH DELICA PLUS LANCET33G) MISC Apply topically daily.     metFORMIN  (GLUCOPHAGE -XR) 500 MG 24 hr tablet Take 500 mg by mouth 2 (two) times daily with a meal.     No facility-administered medications prior to visit.     Per HPI unless specifically indicated in ROS section below Review of Systems  Constitutional:  Negative for fatigue and fever.  HENT:  Negative for congestion.   Eyes:  Negative for pain.  Respiratory:  Negative for cough and shortness of breath.   Cardiovascular:  Negative for chest pain, palpitations and leg swelling.  Gastrointestinal:  Negative for abdominal pain.  Genitourinary:  Negative for dysuria and vaginal bleeding.  Musculoskeletal:  Negative for back pain.  Neurological:  Negative for syncope, light-headedness and headaches.  Psychiatric/Behavioral:  Negative for dysphoric mood.    Objective:  BP 130/80 (BP Location: Right Arm, Patient Position: Sitting, Cuff Size: Large)   Pulse 90   Temp 98.1 F (36.7 C) (Temporal)   Ht 5' 5 (1.651 m)   Wt 181 lb  4 oz (82.2 kg)   SpO2 95%   BMI 30.16 kg/m   Wt Readings from Last 3 Encounters:  12/07/23 181 lb 4 oz (82.2 kg)  08/01/23 181 lb 12.8 oz (82.5 kg)  06/01/23 176 lb 6 oz (80 kg)      Physical Exam Constitutional:      General: She is not in acute distress.    Appearance: Normal appearance. She is well-developed. She is not ill-appearing or toxic-appearing.  HENT:     Head: Normocephalic.     Right Ear: Hearing, tympanic membrane, ear canal and external ear normal. Tympanic membrane is not erythematous,  retracted or bulging.     Left Ear: Hearing, tympanic membrane, ear canal and external ear normal. Tympanic membrane is not erythematous, retracted or bulging.     Nose: No mucosal edema or rhinorrhea.     Right Sinus: No maxillary sinus tenderness or frontal sinus tenderness.     Left Sinus: No maxillary sinus tenderness or frontal sinus tenderness.     Mouth/Throat:     Pharynx: Uvula midline.  Eyes:     General: Lids are normal. Lids are everted, no foreign bodies appreciated.     Conjunctiva/sclera: Conjunctivae normal.     Pupils: Pupils are equal, round, and reactive to light.  Neck:     Thyroid : No thyroid  mass or thyromegaly.     Vascular: No carotid bruit.     Trachea: Trachea normal.  Cardiovascular:     Rate and Rhythm: Normal rate and regular rhythm.     Pulses: Normal pulses.     Heart sounds: Normal heart sounds, S1 normal and S2 normal. No murmur heard.    No friction rub. No gallop.  Pulmonary:     Effort: Pulmonary effort is normal. No tachypnea or respiratory distress.     Breath sounds: Normal breath sounds. No decreased breath sounds, wheezing, rhonchi or rales.  Abdominal:     General: Bowel sounds are normal.     Palpations: Abdomen is soft.     Tenderness: There is no abdominal tenderness.  Musculoskeletal:     Cervical back: Normal range of motion and neck supple.  Skin:    General: Skin is warm and dry.     Findings: No rash.  Neurological:     Mental Status: She is alert.  Psychiatric:        Mood and Affect: Mood is not anxious or depressed.        Speech: Speech normal.        Behavior: Behavior normal. Behavior is cooperative.        Thought Content: Thought content normal.        Judgment: Judgment normal.   Diabetic foot exam: Normal inspection No skin breakdown No calluses  Normal DP pulses Normal sensation to light touch and monofilament Nails normal     Results for orders placed or performed in visit on 12/07/23  [facility] DIABETES FOOT  EXAM   Collection Time: 12/07/23 12:00 AM  Result Value Ref Range   [facility] Diabetic Foot Exam done   POCT glycosylated hemoglobin (Hb A1C)   Collection Time: 12/07/23 10:38 AM  Result Value Ref Range   Hemoglobin A1C 7.6 (A) 4.0 - 5.6 %   HbA1c POC (<> result, manual entry)     HbA1c, POC (prediabetic range)     HbA1c, POC (controlled diabetic range)      Assessment and Plan  Type 2 diabetes mellitus with other circulatory complications (HCC)  Assessment & Plan: Slight worsening over holidays, chronic.  Continue current medication but get back on track with lifestyle changes.  Metformin  XR 500 mg 2 tablets daily  Orders: -     POCT glycosylated hemoglobin (Hb A1C) -     Microalbumin / creatinine urine ratio  Class 1 obesity due to excess calories with serious comorbidity and body mass index (BMI) of 30.0 to 30.9 in adult Assessment & Plan: Encouraged exercise, weight loss, healthy eating habits.    Hypertension associated with diabetes (HCC) Assessment & Plan: Stable, chronic.  Continue current medication.   HCTZ 25 mg daily.   Other orders -     metFORMIN  HCl ER; Take 2 tablets (1,000 mg total) by mouth daily with breakfast.  Dispense: 180 tablet; Refill: 1    Return in about 3 months (around 03/06/2024) for diabetes follow up with POC A1C AND IN 6 MONTHS  annual physical with fasting labs prior.   Greig Ring, MD

## 2024-01-06 ENCOUNTER — Other Ambulatory Visit: Payer: Self-pay | Admitting: Family Medicine

## 2024-02-01 ENCOUNTER — Inpatient Hospital Stay: Payer: HMO | Attending: Oncology | Admitting: Oncology

## 2024-02-01 ENCOUNTER — Encounter: Payer: Self-pay | Admitting: Oncology

## 2024-02-01 VITALS — BP 143/75 | HR 87 | Temp 99.4°F | Resp 18 | Wt 180.5 lb

## 2024-02-01 DIAGNOSIS — C50912 Malignant neoplasm of unspecified site of left female breast: Secondary | ICD-10-CM

## 2024-02-01 DIAGNOSIS — Z923 Personal history of irradiation: Secondary | ICD-10-CM | POA: Diagnosis not present

## 2024-02-01 DIAGNOSIS — Z1721 Progesterone receptor positive status: Secondary | ICD-10-CM | POA: Diagnosis not present

## 2024-02-01 DIAGNOSIS — M8580 Other specified disorders of bone density and structure, unspecified site: Secondary | ICD-10-CM | POA: Insufficient documentation

## 2024-02-01 DIAGNOSIS — C50911 Malignant neoplasm of unspecified site of right female breast: Secondary | ICD-10-CM | POA: Insufficient documentation

## 2024-02-01 DIAGNOSIS — Z79811 Long term (current) use of aromatase inhibitors: Secondary | ICD-10-CM | POA: Diagnosis not present

## 2024-02-01 DIAGNOSIS — Z170 Estrogen receptor positive status [ER+]: Secondary | ICD-10-CM | POA: Insufficient documentation

## 2024-02-01 DIAGNOSIS — Z1732 Human epidermal growth factor receptor 2 negative status: Secondary | ICD-10-CM | POA: Diagnosis not present

## 2024-02-01 DIAGNOSIS — Z08 Encounter for follow-up examination after completed treatment for malignant neoplasm: Secondary | ICD-10-CM

## 2024-02-01 DIAGNOSIS — Z5181 Encounter for therapeutic drug level monitoring: Secondary | ICD-10-CM

## 2024-02-01 NOTE — Progress Notes (Signed)
 Hematology/Oncology Consult note [facility]  [phone] [phone]  Patient Care Team: Excell Seltzer, MD as PCP - General Magrinat, Valentino Hue, MD (Inactive) as Consulting Physician (Oncology) Claud Kelp, MD as Consulting Physician (General Surgery) Carmina Miller, MD as Referring Physician (Radiation Oncology) Jeralyn Ruths, MD as Consulting Physician (Oncology) Creig Hines, MD as Consulting Physician (Oncology)   Name of the patient: Bridget Cummings  [id number]  1951-07-26   Date of visit: 02/01/24  Diagnosis- invasive mammary carcinoma of the right breast pathological prognostic stage IA pT1 ppN0 cM0, grade 1 ER PR positive HER-2/neu negative status post lumpectomy and adjuvant radiation treatment   Chief complaint/ Reason for visit-routine follow-up of breast cancer on Arimidex  Heme/Onc history: patient is a 73 year old female who was diagnosed with left breast cancer in November 2008.  Pathology at that time showed a 1.7 cm DCIS that was ER positive.  She underwent external beam radiation and took tamoxifen for 4 years.  Patient found to have stage I invasive mammary carcinoma ER/PR positive HER-2/neu -8 mm grade 1 in the right breast s/p lumpectomy and adjuvant radiation treatment in 2019.  She did not require adjuvant chemotherapy.  She completed adjuvant radiation therapy and started on Arimidex in February 2020.  She is s/p bilateral oophorectomy in 2009 and has an intact uterus.    Interval history-she is doing well presently.  Denies any new aches and pains anywhere.  Denies any breast concerns.  Tolerating Arimidex well.  ECOG PS- 0 Pain scale- 0   Review of systems- Review of Systems  Constitutional:  Negative for chills, fever, malaise/fatigue and weight loss.  HENT:  Negative for congestion, ear discharge and nosebleeds.   Eyes:  Negative for blurred vision.  Respiratory:  Negative for cough,  hemoptysis, sputum production, shortness of breath and wheezing.   Cardiovascular:  Negative for chest pain, palpitations, orthopnea and claudication.  Gastrointestinal:  Negative for abdominal pain, blood in stool, constipation, diarrhea, heartburn, melena, nausea and vomiting.  Genitourinary:  Negative for dysuria, flank pain, frequency, hematuria and urgency.  Musculoskeletal:  Negative for back pain, joint pain and myalgias.  Skin:  Negative for rash.  Neurological:  Negative for dizziness, tingling, focal weakness, seizures, weakness and headaches.  Endo/Heme/Allergies:  Does not bruise/bleed easily.  Psychiatric/Behavioral:  Negative for depression and suicidal ideas. The patient does not have insomnia.       Allergies  Allergen Reactions   Peppermint Flavoring Agent (Non-Screening)     sneezing     Past Medical History:  Diagnosis Date   Allergy    Breast cancer (HCC) 2008   Left Breast Cancer   Breast cancer (HCC) 2019   Right Breast Cancer   Diabetes mellitus without complication (HCC)    Family history of breast cancer    Hyperlipidemia    Personal history of radiation therapy 2008   Left Breast Cancer   Personal history of radiation therapy 2019   Right Breast Cancer     Past Surgical History:  Procedure Laterality Date   BREAST BIOPSY     BREAST LUMPECTOMY Left 2008   BREAST LUMPECTOMY Right 11/04/2018   BREAST LUMPECTOMY WITH RADIOACTIVE SEED AND SENTINEL LYMPH NODE BIOPSY Right 11/04/2018   Procedure: RIGHT BREAST LUMPECTOMY WITH RADIOACTIVE SEED AND RIGHT AXILLARY SENTINEL LYMPH NODE BIOPSY;  Surgeon: Claud Kelp, MD;  Location: [facility] OR;  Service: General;  Laterality: Right;   FOOT SURGERY     x5 total  OOPHORECTOMY     OVARY REMOVED  2009   TONSILLECTOMY      Social History   Socioeconomic History   Marital status: Married    Spouse name: Not on file   Number of children: 2   Years of education: Not on file   Highest education level: Not on  file  Occupational History   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[employer]    Employer: [employer]  Tobacco Use   Smoking status: Never   Smokeless tobacco: Never  Vaping Use   Vaping status: Never Used  Substance and Sexual Activity   Alcohol use: No   Drug use: No   Sexual activity: Yes  Other Topics Concern   Not on file  Social History Narrative   Regular exercise-yes, walks 45 minutes every day   Diet: fruit and veggies and chicken avoiding salt   Social Drivers of Corporate investment banker Strain: Low Risk  (05/28/2023)   Overall Financial Resource Strain (CARDIA)    Difficulty of Paying Living Expenses: Not hard at all  Food Insecurity: No Food Insecurity (05/28/2023)   Hunger Vital Sign    Worried About Running Out of Food in the Last Year: Never true    Ran Out of Food in the Last Year: Never true  Transportation Needs: No Transportation Needs (05/28/2023)   PRAPARE - Administrator, Civil Service (Medical): No    Lack of Transportation (Non-Medical): No  Physical Activity: Sufficiently Active (05/28/2023)   Exercise Vital Sign    Days of Exercise per Week: 3 days    Minutes of Exercise per Session: 60 min  Stress: No Stress Concern Present (05/28/2023)   [employer] of Occupational Health - Occupational Stress Questionnaire    Feeling of Stress : Not at all  Social Connections: Moderately Integrated (05/28/2023)   Social Connection and Isolation Panel [NHANES]    Frequency of Communication with Friends and Family: More than three times a week    Frequency of Social Gatherings with Friends and Family: More than three times a week    Attends Religious Services: Never    Database administrator or Organizations: Yes    Attends Engineer, structural: More than 4 times per year    Marital Status: Married  Catering manager Violence: Not At Risk (05/28/2023)   Humiliation, Afraid, Rape, and Kick questionnaire    Fear of Current or Ex-Partner: No     Emotionally Abused: No    Physically Abused: No    Sexually Abused: No    Family History  Problem Relation Age of Onset   Breast cancer Mother 56   Diabetes Father    Breast cancer Sister 30   Diabetes Brother    Breast cancer Maternal Aunt 72   Heart attack Maternal Grandmother 1   Breast cancer Cousin        are dx unk     Current Outpatient Medications:    anastrozole (ARIMIDEX) 1 MG tablet, Take 1 tablet (1 mg total) by mouth daily., Disp: 90 tablet, Rfl: 1   aspirin 81 MG tablet, Take 81 mg by mouth at bedtime., Disp: , Rfl:    atorvastatin (LIPITOR) 20 MG tablet, TAKE 1 TABLET(20 MG) BY MOUTH DAILY, Disp: 90 tablet, Rfl: 0   Blood Glucose Monitoring Suppl (ONETOUCH VERIO REFLECT) w/Device KIT, USE TO CHECK BLOOD SUGAR AS DIRECTED., Disp: , Rfl:    Calcium Carb-Cholecalciferol (CALCIUM + VITAMIN D3 PO), Take 2 tablets by mouth daily.,  Disp: , Rfl:    fluocinonide cream (LIDEX) 0.05 %, Apply 1 Application topically 2 (two) times daily as needed., Disp: , Rfl:    glucose blood (ONETOUCH VERIO) test strip, CHECK BLOOD SUGAR DAILY, Disp: 100 strip, Rfl: 3   hydrochlorothiazide (HYDRODIURIL) 25 MG tablet, TAKE 1 TABLET(25 MG) BY MOUTH DAILY, Disp: 90 tablet, Rfl: 3   Lancets (ONETOUCH DELICA PLUS LANCET33G) MISC, Apply topically daily., Disp: , Rfl:    metFORMIN (GLUCOPHAGE-XR) 500 MG 24 hr tablet, Take 2 tablets (1,000 mg total) by mouth daily with breakfast., Disp: 180 tablet, Rfl: 1  Physical exam: There were no vitals filed for this visit. Physical Exam Cardiovascular:     Rate and Rhythm: Normal rate and regular rhythm.     Heart sounds: Normal heart sounds.  Pulmonary:     Effort: Pulmonary effort is normal.     Breath sounds: Normal breath sounds.  Skin:    General: Skin is warm and dry.  Neurological:     Mental Status: She is alert and oriented to person, place, and time.    Breast exam was performed in seated and lying down position. Patient is status post right  lumpectomy with a well-healed surgical scar. No evidence of any palpable masses. No evidence of axillary adenopathy. No evidence of any palpable masses or lumps in the left breast. No evidence of leftt axillary adenopathy      Latest Ref Rng & Units 06/01/2023   11:45 AM  CMP  Glucose 70 - 99 mg/dL 098   BUN 6 - 23 mg/dL 18   Creatinine 1.19 - 1.20 mg/dL 1.47   Sodium 829 - 562 mEq/L 141   Potassium 3.5 - 5.1 mEq/L 3.6   Chloride 96 - 112 mEq/L 101   CO2 19 - 32 mEq/L 31   Calcium 8.4 - 10.5 mg/dL 9.5   Total Protein 6.0 - 8.3 g/dL 6.9   Total Bilirubin 0.2 - 1.2 mg/dL 0.9   Alkaline Phos 39 - 117 U/L 65   AST 0 - 37 U/L 28   ALT 0 - 35 U/L 33       Latest Ref Rng & Units 07/31/2022   12:41 PM  CBC  WBC 4.0 - 10.5 K/uL 7.7   Hemoglobin 12.0 - 15.0 g/dL 13.0   Hematocrit 86.5 - 46.0 % 45.0   Platelets 150 - 400 K/uL 276      Assessment and plan- Patient is a 73 y.o. female with  invasive mammary carcinoma of the right breast pathological prognostic stage IA pT1 pN0 cM0, grade 1 ER PR positive HER-2/neu negative status post lumpectomy and adjuvant radiation treatment.  This is a routine follow-up visit of breast cancer on Arimidex  Patient has completed 5 years of endocrine therapy with Arimidex this month.  She no longer needs to take Arimidex.  Clinically patient is doing well with no concerning signs and symptoms of recurrence based on today's exam.  She can continue to get yearly mammograms and breast exams through her primary care doctor.  She can be referred to [place] in the future if questions or concerns arise.  Her last bone density scan was in January 2024 which showed osteopenia.  10-year probability of a major osteoporotic fracture was less than 20% and hip fracture was less than 3%.  This can be monitored next year by her primary care doctor as well   Visit Diagnosis 1. Encounter for follow-up surveillance of breast cancer   2. Visit for  monitoring Arimidex therapy       Dr. Owens Shark, MD, MPH [facility] [phone] 02/01/2024 1:18 PM

## 2024-02-14 DIAGNOSIS — M67874 Other specified disorders of tendon, left ankle and foot: Secondary | ICD-10-CM | POA: Diagnosis not present

## 2024-02-21 ENCOUNTER — Other Ambulatory Visit: Payer: Self-pay | Admitting: Family Medicine

## 2024-02-22 ENCOUNTER — Other Ambulatory Visit: Payer: Self-pay

## 2024-02-22 MED ORDER — ATORVASTATIN CALCIUM 20 MG PO TABS: 20.0000 mg | ORAL_TABLET | Freq: Every day | ORAL | 0 refills | Status: DC

## 2024-02-22 NOTE — Telephone Encounter (Signed)
Refill resent to correct pharmacy

## 2024-02-22 NOTE — Telephone Encounter (Signed)
 Copied from CRM [id number]. Topic: Clinical - Medication Refill >> Feb 22, 2024 12:52 PM Tiffany S wrote: Most Recent Primary Care Visit:  Provider: Kerby Nora E  Department: [facility]  Visit Type: OFFICE VISIT  Date: 12/07/2023  Medication: atorvastatin (LIPITOR) 20 MG tablet [[id number]]    Has the patient contacted their pharmacy? Yes (Agent: If no, request that the patient contact the pharmacy for the refill. If patient does not wish to contact the pharmacy document the reason why and proceed with request.) (Agent: If yes, when and what did the pharmacy advise?)  Is this the correct pharmacy for this prescription? Yes If no, delete pharmacy and type the correct one.  This is the patient's preferred pharmacy:  [id number] [place], [place] - [address] [place] [address] Phone: [phone] Fax: [phone]  [id number] [place], [place] - [address] AT [facility] [address] [place] [address] Phone: [phone] Fax: [phone]   Has the prescription been filled recently? Yes  Is the patient out of the medication? Yes  Has the patient been seen for an appointment in the last year OR does the patient have an upcoming appointment? Yes  Can we respond through MyChart? Yes  Agent: Please be advised that Rx refills may take up to 3 business days. We ask that you follow-up with your pharmacy.   [facility]

## 2024-03-07 ENCOUNTER — Ambulatory Visit: Payer: PPO | Admitting: Family Medicine

## 2024-03-07 ENCOUNTER — Encounter: Payer: Self-pay | Admitting: Family Medicine

## 2024-03-07 VITALS — BP 130/80 | HR 92 | Temp 98.2°F | Ht 65.0 in | Wt 181.1 lb

## 2024-03-07 DIAGNOSIS — Z7984 Long term (current) use of oral hypoglycemic drugs: Secondary | ICD-10-CM

## 2024-03-07 DIAGNOSIS — E1159 Type 2 diabetes mellitus with other circulatory complications: Secondary | ICD-10-CM

## 2024-03-07 DIAGNOSIS — I152 Hypertension secondary to endocrine disorders: Secondary | ICD-10-CM

## 2024-03-07 LAB — POCT GLYCOSYLATED HEMOGLOBIN (HGB A1C): Hemoglobin A1C: 8.3 % — AB (ref 4.0–5.6)

## 2024-03-07 MED ORDER — HYDROCHLOROTHIAZIDE 25 MG PO TABS: 25.0000 mg | ORAL_TABLET | Freq: Every day | ORAL | 0 refills | Status: DC

## 2024-03-07 MED ORDER — METFORMIN HCL ER 500 MG PO TB24: 1500.0000 mg | ORAL_TABLET | Freq: Every day | ORAL | 3 refills | Status: DC

## 2024-03-07 NOTE — Progress Notes (Signed)
 Patient ID: LABRISHA WUELLNER, female    DOB: Aug 20, 1951, 73 y.o.   MRN: [id number]  This visit was conducted in person.  BP 130/80 (BP Location: Right Arm, Patient Position: Sitting, Cuff Size: Large)   Pulse 92   Temp 98.2 F (36.8 C) (Temporal)   Ht 5\' 5"  (1.651 m)   Wt 181 lb 2 oz (82.2 kg)   SpO2 98%   BMI 30.14 kg/m    CC:  Chief Complaint  Patient presents with   Diabetes    Subjective:   HPI: Theta Leaf Micheli is a 73 y.o. female presenting on 03/07/2024 for Diabetes  Diabetes: Previously well-controlled, now worsening control  on metformin XR 500 mg 2 tablets daily.   Some room decreased in carb... decreasing frosted flake Lab Results  Component Value Date   HGBA1C 8.3 (A) 03/07/2024  Using medications without difficulties:  some intermittent diarrhea, wishes to decrease dose Hypoglycemic episodes: Hyperglycemic episodes: Feet problems: none Blood Sugars averaging: eye exam within last year:  due   Hypertension:  Well-controlled on hydrochlorothiazide 25 mg p.o. daily BP Readings from Last 3 Encounters:  03/07/24 130/80  02/01/24 (!) 143/75  12/07/23 130/80  Using medication without problems or lightheadedness:  none Chest pain with exertion: none Edema: none Short of breath:none Average home BPs: 120/70s Other issues:  Off  anastrozole in 01/2024  Relevant past medical, surgical, family and social history reviewed and updated as indicated. Interim medical history since our last visit reviewed. Allergies and medications reviewed and updated. Outpatient Medications Prior to Visit  Medication Sig Dispense Refill   aspirin 81 MG tablet Take 81 mg by mouth at bedtime.     atorvastatin (LIPITOR) 20 MG tablet Take 1 tablet (20 mg total) by mouth daily. 90 tablet 0   Blood Glucose Monitoring Suppl (ONETOUCH VERIO REFLECT) w/Device KIT USE TO CHECK BLOOD SUGAR AS DIRECTED.     Calcium Carb-Cholecalciferol (CALCIUM + VITAMIN D3 PO) Take 2 tablets by  mouth daily.     fluocinonide cream (LIDEX) 0.05 % Apply 1 Application topically 2 (two) times daily as needed.     glucose blood (ONETOUCH VERIO) test strip CHECK BLOOD SUGAR DAILY 100 strip 3   Lancets (ONETOUCH DELICA PLUS LANCET33G) MISC Apply topically daily.     naproxen (NAPROSYN) 500 MG tablet Take 500 mg by mouth 2 (two) times daily.     anastrozole (ARIMIDEX) 1 MG tablet Take 1 tablet (1 mg total) by mouth daily. 90 tablet 1   metFORMIN (GLUCOPHAGE-XR) 500 MG 24 hr tablet Take 2 tablets (1,000 mg total) by mouth daily with breakfast. 180 tablet 1   hydrochlorothiazide (HYDRODIURIL) 25 MG tablet TAKE 1 TABLET(25 MG) BY MOUTH DAILY 90 tablet 3   No facility-administered medications prior to visit.     Per HPI unless specifically indicated in ROS section below Review of Systems  Constitutional:  Negative for fatigue and fever.  HENT:  Negative for congestion.   Eyes:  Negative for pain.  Respiratory:  Negative for cough and shortness of breath.   Cardiovascular:  Negative for chest pain, palpitations and leg swelling.  Gastrointestinal:  Negative for abdominal pain.  Genitourinary:  Negative for dysuria and vaginal bleeding.  Musculoskeletal:  Negative for back pain.  Neurological:  Negative for syncope, light-headedness and headaches.  Psychiatric/Behavioral:  Negative for dysphoric mood.    Objective:  BP 130/80 (BP Location: Right Arm, Patient Position: Sitting, Cuff Size: Large)   Pulse 92   Temp  98.2 F (36.8 C) (Temporal)   Ht 5\' 5"  (1.651 m)   Wt 181 lb 2 oz (82.2 kg)   SpO2 98%   BMI 30.14 kg/m   Wt Readings from Last 3 Encounters:  03/07/24 181 lb 2 oz (82.2 kg)  02/01/24 180 lb 8 oz (81.9 kg)  12/07/23 181 lb 4 oz (82.2 kg)      Physical Exam Constitutional:      General: She is not in acute distress.    Appearance: Normal appearance. She is well-developed. She is not ill-appearing or toxic-appearing.  HENT:     Head: Normocephalic.     Right Ear:  Hearing, tympanic membrane, ear canal and external ear normal. Tympanic membrane is not erythematous, retracted or bulging.     Left Ear: Hearing, tympanic membrane, ear canal and external ear normal. Tympanic membrane is not erythematous, retracted or bulging.     Nose: No mucosal edema or rhinorrhea.     Right Sinus: No maxillary sinus tenderness or frontal sinus tenderness.     Left Sinus: No maxillary sinus tenderness or frontal sinus tenderness.     Mouth/Throat:     Pharynx: Uvula midline.  Eyes:     General: Lids are normal. Lids are everted, no foreign bodies appreciated.     Conjunctiva/sclera: Conjunctivae normal.     Pupils: Pupils are equal, round, and reactive to light.  Neck:     Thyroid: No thyroid mass or thyromegaly.     Vascular: No carotid bruit.     Trachea: Trachea normal.  Cardiovascular:     Rate and Rhythm: Normal rate and regular rhythm.     Pulses: Normal pulses.     Heart sounds: Normal heart sounds, S1 normal and S2 normal. No murmur heard.    No friction rub. No gallop.  Pulmonary:     Effort: Pulmonary effort is normal. No tachypnea or respiratory distress.     Breath sounds: Normal breath sounds. No decreased breath sounds, wheezing, rhonchi or rales.  Abdominal:     General: Bowel sounds are normal.     Palpations: Abdomen is soft.     Tenderness: There is no abdominal tenderness.  Musculoskeletal:     Cervical back: Normal range of motion and neck supple.  Skin:    General: Skin is warm and dry.     Findings: No rash.  Neurological:     Mental Status: She is alert.  Psychiatric:        Mood and Affect: Mood is not anxious or depressed.        Speech: Speech normal.        Behavior: Behavior normal. Behavior is cooperative.        Thought Content: Thought content normal.        Judgment: Judgment normal.        Results for orders placed or performed in visit on 03/07/24  POCT glycosylated hemoglobin (Hb A1C)   Collection Time: 03/07/24  11:16 AM  Result Value Ref Range   Hemoglobin A1C 8.3 (A) 4.0 - 5.6 %   HbA1c POC (<> result, manual entry)     HbA1c, POC (prediabetic range)     HbA1c, POC (controlled diabetic range)      Assessment and Plan  Type 2 diabetes mellitus with other circulatory complications (HCC) Assessment & Plan:  Worsening control.    She will work on low carb diet and continue regular aerobic exercise.   Increase Metformin XR 500 mg 3 tablets  daily. Discussed other meds such as GLP1  but she is not interested.   Re-eval in 3 months.  Orders: -     POCT glycosylated hemoglobin (Hb A1C)  Hypertension associated with diabetes (HCC) Assessment & Plan: Stable, chronic.  Continue current medication.   HCTZ 25 mg daily.   Other orders -     hydroCHLOROthiazide; Take 1 tablet (25 mg total) by mouth daily.  Dispense: 90 tablet; Refill: 0 -     metFORMIN HCl ER; Take 3 tablets (1,500 mg total) by mouth daily with breakfast.  Dispense: 270 tablet; Refill: 3    No follow-ups on file.   Kerby Nora, MD

## 2024-03-07 NOTE — Assessment & Plan Note (Addendum)
 Worsening control.    She will work on low carb diet and continue regular aerobic exercise.   Increase Metformin XR 500 mg 3 tablets daily. Discussed other meds such as GLP1  but she is not interested.   Re-eval in 3 months.

## 2024-03-07 NOTE — Assessment & Plan Note (Signed)
Stable, chronic.  Continue current medication.  HCTZ 25 mg daily 

## 2024-03-12 ENCOUNTER — Other Ambulatory Visit: Payer: Self-pay | Admitting: Oncology

## 2024-04-07 DIAGNOSIS — S8265XA Nondisplaced fracture of lateral malleolus of left fibula, initial encounter for closed fracture: Secondary | ICD-10-CM | POA: Diagnosis not present

## 2024-04-15 DIAGNOSIS — S8265XA Nondisplaced fracture of lateral malleolus of left fibula, initial encounter for closed fracture: Secondary | ICD-10-CM | POA: Diagnosis not present

## 2024-05-05 DIAGNOSIS — S8265XA Nondisplaced fracture of lateral malleolus of left fibula, initial encounter for closed fracture: Secondary | ICD-10-CM | POA: Diagnosis not present

## 2024-05-28 ENCOUNTER — Ambulatory Visit (INDEPENDENT_AMBULATORY_CARE_PROVIDER_SITE_OTHER): Payer: PPO

## 2024-05-28 VITALS — Ht 65.0 in | Wt 181.0 lb

## 2024-05-28 DIAGNOSIS — Z0000 Encounter for general adult medical examination without abnormal findings: Secondary | ICD-10-CM | POA: Diagnosis not present

## 2024-05-28 NOTE — Patient Instructions (Signed)
 [name] , Thank you for taking time out of your busy schedule to complete your Annual Wellness Visit with me. I enjoyed our conversation and look forward to speaking with you again next year. I, as well as your care team,  appreciate your ongoing commitment to your health goals. Please review the following plan we discussed and let me know if I can assist you in the future. Your Game plan/ To Do List     Follow up Visits: Next Medicare AWV with our clinical staff: 05/29/25 @8 :10am televisit   Have you seen your provider in the last 6 months (3 months if uncontrolled diabetes)? Yes Next Office Visit with your provider: 06/13/24  Clinician Recommendations:  Aim for 30 minutes of exercise or brisk walking, 6-8 glasses of water, and 5 servings of fruits and vegetables each day.       This is a list of the screening recommended for you and due dates:  Health Maintenance  Topic Date Due   COVID-19 Vaccine (6 - Mixed Product risk 2024-25 season) 02/17/2024   Eye exam for diabetics  05/20/2024   Yearly kidney function blood test for diabetes  05/31/2024   Flu Shot  07/04/2024   Hemoglobin A1C  09/06/2024   Yearly kidney health urinalysis for diabetes  12/06/2024   Complete foot exam   12/06/2024   Medicare Annual Wellness Visit  05/28/2025   Mammogram  10/21/2025   DEXA scan (bone density measurement)  01/05/2028   Colon Cancer Screening  12/02/2029   DTaP/Tdap/Td vaccine (4 - Tdap) 05/25/2032   Pneumococcal Vaccine for age over 15  Completed   Hepatitis C Screening  Completed   Zoster (Shingles) Vaccine  Completed   Hepatitis B Vaccine  Aged Out   HPV Vaccine  Aged Out   Meningitis B Vaccine  Aged Out    Advanced directives: (Declined) Advance directive discussed with you today. Even though you declined this today, please call our office should you change your mind, and we can give you the proper paperwork for you to fill out. Advance Care Planning is important because it:  [x]   Makes sure you receive the medical care that is consistent with your values, goals, and preferences  [x]  It provides guidance to your family and loved ones and reduces their decisional burden about whether or not they are making the right decisions based on your wishes.  Follow the link provided in your after visit summary or read over the paperwork we have mailed to you to help you started getting your Advance Directives in place. If you need assistance in completing these, please reach out to us  so that we can help you!

## 2024-05-28 NOTE — Progress Notes (Signed)
 Subjective:   Bridget Cummings is a 73 y.o. who presents for a Medicare Wellness preventive visit.  As a reminder, Annual Wellness Visits don't include a physical exam, and some assessments may be limited, especially if this visit is performed virtually. We may recommend an in-person follow-up visit with your provider if needed.  Visit Complete: Virtual I connected with  Bridget Cummings on 05/28/24 by a audio enabled telemedicine application and verified that I am speaking with the correct person using two identifiers.  Patient Location: Home  Provider Location: Home Office  I discussed the limitations of evaluation and management by telemedicine. The patient expressed understanding and agreed to proceed.  Vital Signs: Because this visit was a virtual/telehealth visit, some criteria may be missing or patient reported. Any vitals not documented were not able to be obtained and vitals that have been documented are patient reported.  VideoDeclined- This patient declined Librarian, academic. Therefore the visit was completed with audio only.  Persons Participating in Visit: Patient.  AWV Questionnaire: No: Patient Medicare AWV questionnaire was not completed prior to this visit.  Cardiac Risk Factors include: advanced age (>11men, >48 women);diabetes mellitus;hypertension;dyslipidemia;obesity (BMI >30kg/m2)     Objective:    Today's Vitals   05/28/24 0812  Weight: 181 lb (82.1 kg)  Height: 5' 5 (1.651 m)   Body mass index is 30.12 kg/m.     05/28/2024    8:28 AM 02/01/2024    1:15 PM 08/01/2023    1:19 PM 05/28/2023   10:46 AM 03/12/2023   11:30 AM 01/31/2023    1:02 PM 10/13/2022    1:23 PM  Advanced Directives  Does Patient Have a Medical Advance Directive? No No Yes No No No No  Type of Surveyor, minerals;Living will   Healthcare Power of [place];Living will   Does patient want to make changes to medical  advance directive?      No - Patient declined   Would patient like information on creating a medical advance directive?  No - Patient declined  No - Patient declined No - Patient declined No - Patient declined No - Patient declined    Current Medications (verified) Outpatient Encounter Medications as of 05/28/2024  Medication Sig   aspirin 81 MG tablet Take 81 mg by mouth at bedtime.   atorvastatin  (LIPITOR) 20 MG tablet Take 1 tablet (20 mg total) by mouth daily.   Blood Glucose Monitoring Suppl (ONETOUCH VERIO REFLECT) w/Device KIT USE TO CHECK BLOOD SUGAR AS DIRECTED.   Calcium  Carb-Cholecalciferol (CALCIUM  + VITAMIN D3 PO) Take 2 tablets by mouth daily.   glucose blood (ONETOUCH VERIO) test strip CHECK BLOOD SUGAR DAILY   hydrochlorothiazide  (HYDRODIURIL ) 25 MG tablet Take 1 tablet (25 mg total) by mouth daily.   Lancets (ONETOUCH DELICA PLUS LANCET33G) MISC Apply topically daily.   metFORMIN  (GLUCOPHAGE -XR) 500 MG 24 hr tablet Take 3 tablets (1,500 mg total) by mouth daily with breakfast.   fluocinonide cream (LIDEX) 0.05 % Apply 1 Application topically 2 (two) times daily as needed. (Patient not taking: Reported on 05/28/2024)   naproxen (NAPROSYN) 500 MG tablet Take 500 mg by mouth 2 (two) times daily. (Patient not taking: Reported on 05/28/2024)   No facility-administered encounter medications on file as of 05/28/2024.    Allergies (verified) Peppermint flavoring agent (non-screening)   History: Past Medical History:  Diagnosis Date   Allergy    Breast cancer (HCC) 2008   Left Breast  Cancer   Breast cancer (HCC) 2019   Right Breast Cancer   Diabetes mellitus without complication (HCC)    Family history of breast cancer    Hyperlipidemia    Personal history of radiation therapy 2008   Left Breast Cancer   Personal history of radiation therapy 2019   Right Breast Cancer   Past Surgical History:  Procedure Laterality Date   BREAST BIOPSY     BREAST LUMPECTOMY Left 2008    BREAST LUMPECTOMY Right 11/04/2018   BREAST LUMPECTOMY WITH RADIOACTIVE SEED AND SENTINEL LYMPH NODE BIOPSY Right 11/04/2018   Procedure: RIGHT BREAST LUMPECTOMY WITH RADIOACTIVE SEED AND RIGHT AXILLARY SENTINEL LYMPH NODE BIOPSY;  Surgeon: Gail Favorite, MD;  Location: [facility] OR;  Service: General;  Laterality: Right;   FOOT SURGERY     x5 total   OOPHORECTOMY     OVARY REMOVED  2009   TONSILLECTOMY     Family History  Problem Relation Age of Onset   Breast cancer Mother 53   Diabetes Father    Breast cancer Sister 52   Diabetes Brother    Breast cancer Maternal Aunt 72   Heart attack Maternal Grandmother 87   Breast cancer Cousin        are dx unk   Social History   Socioeconomic History   Marital status: Married    Spouse name: Not on file   Number of children: 2   Years of education: Not on file   Highest education level: Not on file  Occupational History   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    Employer: [employer]  Tobacco Use   Smoking status: Never   Smokeless tobacco: Never  Vaping Use   Vaping status: Never Used  Substance and Sexual Activity   Alcohol use: No   Drug use: No   Sexual activity: Yes  Other Topics Concern   Not on file  Social History Narrative   Regular exercise-yes, walks 45 minutes every day   Diet: fruit and veggies and chicken avoiding salt   Social Drivers of Corporate investment banker Strain: Low Risk  (05/28/2024)   Overall Financial Resource Strain (CARDIA)    Difficulty of Paying Living Expenses: Not hard at all  Food Insecurity: No Food Insecurity (05/28/2024)   Hunger Vital Sign    Worried About Running Out of Food in the Last Year: Never true    Ran Out of Food in the Last Year: Never true  Transportation Needs: No Transportation Needs (05/28/2024)   PRAPARE - Administrator, Civil Service (Medical): No    Lack of Transportation (Non-Medical): No  Physical Activity: Sufficiently Active (05/28/2024)   Exercise Vital  Sign    Days of Exercise per Week: 3 days    Minutes of Exercise per Session: 60 min  Stress: No Stress Concern Present (05/28/2024)   [employer] of Occupational Health - Occupational Stress Questionnaire    Feeling of Stress: Not at all  Social Connections: Moderately Integrated (05/28/2024)   Social Connection and Isolation Panel    Frequency of Communication with Friends and Family: More than three times a week    Frequency of Social Gatherings with Friends and Family: More than three times a week    Attends Religious Services: Never    Database administrator or Organizations: Yes    Attends Banker Meetings: Never    Marital Status: Married    Tobacco Counseling Counseling given: Not Answered  Clinical Intake:  Pre-visit preparation completed: Yes  Pain : No/denies pain     BMI - recorded: 30.12 Nutritional Status: BMI > 30  Obese Nutritional Risks: None Diabetes: Yes CBG done?: No Did pt. bring in CBG monitor from home?: No  Lab Results  Component Value Date   HGBA1C 8.3 (A) 03/07/2024   HGBA1C 7.6 (A) 12/07/2023   HGBA1C 6.8 (A) 06/01/2023     How often do you need to have someone help you when you read instructions, pamphlets, or other written materials from your doctor or pharmacy?: 1 - Never  Interpreter Needed?: No  Comments: lives with husband Information entered by :: B.Sevyn Paredez,LPN   Activities of Daily Living     05/28/2024    8:28 AM  In your present state of health, do you have any difficulty performing the following activities:  Hearing? 0  Vision? 0  Difficulty concentrating or making decisions? 0  Walking or climbing stairs? 0  Dressing or bathing? 0  Doing errands, shopping? 0  Preparing Food and eating ? N  Using the Toilet? N  In the past six months, have you accidently leaked urine? N  Do you have problems with loss of bowel control? N  Managing your Medications? N  Managing your Finances? N  Housekeeping or  managing your Housekeeping? N    Patient Care Team: Avelina Greig BRAVO, MD as PCP - General Magrinat, Sandria BROCKS, MD (Inactive) as Consulting Physician (Oncology) Gail Favorite, MD as Consulting Physician (General Surgery) Lenn Aran, MD as Referring Physician (Radiation Oncology) Jacobo Evalene PARAS, MD as Consulting Physician (Oncology) Melanee Annah BROCKS, MD as Consulting Physician (Oncology)  I have updated your Care Teams any recent Medical Services you may have received from other providers in the past year.     Assessment:   This is a routine wellness examination for Zniyah.  Hearing/Vision screen Hearing Screening - Comments:: Pt says her hearing is wonderful Vision Screening - Comments:: Pt says her vision is good  Dr Lynwood Bunker   Goals Addressed               This Visit's Progress     Patient Stated (pt-stated)   Not on track      05/28/24-I will continue to walk daily for about 20 minutes.. will continue as heals from ankle injury       Depression Screen     05/28/2024    8:23 AM 03/07/2024   11:14 AM 05/28/2023   10:45 AM 10/13/2022    1:24 PM 05/22/2022   12:38 PM 05/17/2021   12:43 PM 05/21/2020   10:05 AM  PHQ 2/9 Scores  PHQ - 2 Score 0 0 0 0 0 0 0  PHQ- 9 Score      0     Fall Risk     05/28/2024    8:15 AM 03/07/2024   11:14 AM 05/28/2023   10:46 AM 10/13/2022    1:24 PM 05/22/2022   12:40 PM  Fall Risk   Falls in the past year? 1 0 0 0 0  Number falls in past yr: 0 0 0  0  Injury with Fall? 1 0 0  0  Comment slipped on stair :broke left ankle      Risk for fall due to : No Fall Risks No Fall Risks No Fall Risks  No Fall Risks  Follow up Education provided;Falls prevention discussed Falls evaluation completed Falls prevention discussed;Falls evaluation completed  MEDICARE RISK AT HOME:  Medicare Risk at Home Any stairs in or around the home?: Yes If so, are there any without handrails?: Yes Home free of loose throw rugs in  walkways, pet beds, electrical cords, etc?: Yes Adequate lighting in your home to reduce risk of falls?: Yes Life alert?: No Use of a cane, walker or w/c?: No Grab bars in the bathroom?: Yes Shower chair or bench in shower?: No Elevated toilet seat or a handicapped toilet?: No  TIMED UP AND GO:  Was the test performed?  No  Cognitive Function: 6CIT completed    05/17/2021   12:44 PM  MMSE - Mini Mental State Exam  Orientation to time 5  Orientation to Place 5  Registration 3  Attention/ Calculation 5  Recall 3  Language- repeat 1        05/28/2024    8:31 AM 05/28/2023   10:48 AM 05/22/2022   12:42 PM  6CIT Screen  What Year? 0 points 4 points 0 points  What month? 0 points 0 points 0 points  What time? 0 points 0 points 0 points  Count back from 20 0 points 0 points 0 points  Months in reverse 0 points 0 points 0 points  Repeat phrase 0 points 2 points 0 points  Total Score 0 points 6 points 0 points    Immunizations Immunization History  Administered Date(s) Administered   Fluad Quad(high Dose 65+) 08/23/2019   Influenza Split 09/21/2011, 09/26/2012   Influenza Whole 12/04/2005, 12/20/2007, 08/24/2008, 09/21/2009, 09/13/2010   Influenza, High Dose Seasonal PF 08/28/2017, 09/12/2021, 09/01/2022, 08/20/2023   Influenza, Seasonal, Injecte, Preservative Fre 08/31/2014, 09/04/2015, 09/05/2016   Influenza,inj,Quad PF,6+ Mos 09/30/2013   Influenza,trivalent, recombinat, inj, PF 07/30/2018   Moderna Covid-19 Fall Seasonal Vaccine 95yrs & older 08/20/2023   Moderna SARS-COV2 Booster Vaccination 09/27/2020   Moderna Sars-Covid-2 Vaccination 01/07/2020, 02/04/2020   Pfizer Covid-19 Vaccine Bivalent Booster 5y-11y 09/12/2021   Pfizer(Comirnaty)Fall Seasonal Vaccine 12 years and older 09/01/2022   Pneumococcal Conjugate-13 06/21/2017   Pneumococcal Polysaccharide-23 06/26/2018   Respiratory Syncytial Virus Vaccine,Recomb Aduvanted(Arexvy) 09/12/2022   Td 12/05/1999,  09/13/2010, 05/25/2022   Zoster Recombinant(Shingrix) 12/05/2021, 03/13/2022   Zoster, Live 09/26/2012    Screening Tests Health Maintenance  Topic Date Due   COVID-19 Vaccine (6 - Mixed Product risk 2024-25 season) 02/17/2024   OPHTHALMOLOGY EXAM  05/20/2024   Diabetic kidney evaluation - eGFR measurement  05/31/2024   INFLUENZA VACCINE  07/04/2024   HEMOGLOBIN A1C  09/06/2024   Diabetic kidney evaluation - Urine ACR  12/06/2024   FOOT EXAM  12/06/2024   Medicare Annual Wellness (AWV)  05/28/2025   MAMMOGRAM  10/21/2025   DEXA SCAN  01/05/2028   Colonoscopy  12/02/2029   DTaP/Tdap/Td (4 - Tdap) 05/25/2032   Pneumococcal Vaccine: 50+ Years  Completed   Hepatitis C Screening  Completed   Zoster Vaccines- Shingrix  Completed   Hepatitis B Vaccines  Aged Out   HPV VACCINES  Aged Out   Meningococcal B Vaccine  Aged Out    Health Maintenance  Health Maintenance Due  Topic Date Due   COVID-19 Vaccine (6 - Mixed Product risk 2024-25 season) 02/17/2024   OPHTHALMOLOGY EXAM  05/20/2024   Diabetic kidney evaluation - eGFR measurement  05/31/2024   Health Maintenance Items Addressed: None needed at this time  Additional Screening:  Vision Screening: Recommended annual ophthalmology exams for early detection of glaucoma and other disorders of the eye. Would you like a referral to an  eye doctor? No    Dental Screening: Recommended annual dental exams for proper oral hygiene  Community Resource Referral / Chronic Care Management: CRR required this visit?  No   CCM required this visit?  No   Plan:    I have personally reviewed and noted the following in the patient's chart:   Medical and social history Use of alcohol, tobacco or illicit drugs  Current medications and supplements including opioid prescriptions. Patient is not currently taking opioid prescriptions. Functional ability and status Nutritional status Physical activity Advanced directives List of other  physicians Hospitalizations, surgeries, and ER visits in previous 12 months Vitals Screenings to include cognitive, depression, and falls Referrals and appointments  In addition, I have reviewed and discussed with patient certain preventive protocols, quality metrics, and best practice recommendations. A written personalized care plan for preventive services as well as general preventive health recommendations were provided to patient.   Erminio LITTIE Saris, LPN   [id number]   After Visit Summary: (MyChart) Due to this being a telephonic visit, the after visit summary with patients personalized plan was offered to patient via MyChart   Notes: Nothing significant to report at this time.

## 2024-06-03 ENCOUNTER — Other Ambulatory Visit: Payer: Self-pay | Admitting: Family Medicine

## 2024-06-04 ENCOUNTER — Other Ambulatory Visit: Payer: Self-pay | Admitting: Family Medicine

## 2024-06-09 DIAGNOSIS — M67874 Other specified disorders of tendon, left ankle and foot: Secondary | ICD-10-CM | POA: Diagnosis not present

## 2024-06-13 ENCOUNTER — Ambulatory Visit: Payer: Self-pay | Admitting: Family Medicine

## 2024-06-13 ENCOUNTER — Ambulatory Visit (INDEPENDENT_AMBULATORY_CARE_PROVIDER_SITE_OTHER): Payer: PPO | Admitting: Family Medicine

## 2024-06-13 ENCOUNTER — Encounter: Payer: Self-pay | Admitting: Family Medicine

## 2024-06-13 VITALS — BP 124/80 | HR 90 | Temp 99.3°F | Ht 64.75 in | Wt 176.4 lb

## 2024-06-13 DIAGNOSIS — E1159 Type 2 diabetes mellitus with other circulatory complications: Secondary | ICD-10-CM

## 2024-06-13 DIAGNOSIS — E1169 Type 2 diabetes mellitus with other specified complication: Secondary | ICD-10-CM

## 2024-06-13 DIAGNOSIS — Z0000 Encounter for general adult medical examination without abnormal findings: Secondary | ICD-10-CM

## 2024-06-13 DIAGNOSIS — E785 Hyperlipidemia, unspecified: Secondary | ICD-10-CM

## 2024-06-13 DIAGNOSIS — I152 Hypertension secondary to endocrine disorders: Secondary | ICD-10-CM | POA: Diagnosis not present

## 2024-06-13 DIAGNOSIS — Z7984 Long term (current) use of oral hypoglycemic drugs: Secondary | ICD-10-CM

## 2024-06-13 DIAGNOSIS — Z853 Personal history of malignant neoplasm of breast: Secondary | ICD-10-CM

## 2024-06-13 LAB — LIPID PANEL
Cholesterol: 138 mg/dL (ref 0–200)
HDL: 46 mg/dL (ref >39.00)
LDL Cholesterol: 68 mg/dL (ref 0–99)
NonHDL: 92.12
Total CHOL/HDL Ratio: 3
Triglycerides: 119 mg/dL (ref 0.0–149.0)
VLDL: 23.8 mg/dL (ref 0.0–40.0)

## 2024-06-13 LAB — COMPREHENSIVE METABOLIC PANEL WITH GFR
ALT: 29 U/L (ref 0–35)
AST: 31 U/L (ref 0–37)
Albumin: 4.5 g/dL (ref 3.5–5.2)
Alkaline Phosphatase: 71 U/L (ref 39–117)
BUN: 15 mg/dL (ref 6–23)
CO2: 32 meq/L (ref 19–32)
Calcium: 9.2 mg/dL (ref 8.4–10.5)
Chloride: 99 meq/L (ref 96–112)
Creatinine, Ser: 0.95 mg/dL (ref 0.40–1.20)
GFR: 60.08 mL/min (ref >60.00)
Glucose, Bld: 168 mg/dL — ABNORMAL HIGH (ref 70–99)
Potassium: 3.8 meq/L (ref 3.5–5.1)
Sodium: 141 meq/L (ref 135–145)
Total Bilirubin: 1.1 mg/dL (ref 0.2–1.2)
Total Protein: 6.9 g/dL (ref 6.0–8.3)

## 2024-06-13 LAB — MICROALBUMIN / CREATININE URINE RATIO
Creatinine,U: 216.9 mg/dL
Microalb Creat Ratio: 5.9 mg/g (ref 0.0–30.0)
Microalb, Ur: 1.3 mg/dL (ref 0.0–1.9)

## 2024-06-13 LAB — HEMOGLOBIN A1C: Hgb A1c MFr Bld: 7.9 % — ABNORMAL HIGH (ref 4.6–6.5)

## 2024-06-13 MED ORDER — METFORMIN HCL ER 500 MG PO TB24: 2000.0000 mg | ORAL_TABLET | Freq: Every day | ORAL | 3 refills | Status: DC

## 2024-06-13 NOTE — Assessment & Plan Note (Signed)
Stable, chronic.  Continue current medication.  HCTZ 25 mg daily 

## 2024-06-13 NOTE — Patient Instructions (Signed)
Set up yearly eye exam for diabetes and have the opthalmologist send [place] a copy of the evaluation for the chart.

## 2024-06-13 NOTE — Assessment & Plan Note (Signed)
 She has completed arimidex 

## 2024-06-13 NOTE — Assessment & Plan Note (Signed)
 Due for re-eval.  She will work on low carb diet and continue regular aerobic exercise.  Now on  Metformin  XR 500 mg 3 tablets daily. Discussed other meds such as GLP1  but she is not interested.

## 2024-06-13 NOTE — Progress Notes (Signed)
 Patient ID: Bridget Cummings, female    DOB: 01-24-1951, 73 y.o.   MRN: [id number]  This visit was conducted in person.  BP 124/80   Pulse 90   Temp 99.3 F (37.4 C) (Temporal)   Ht 5' 4.75 (1.645 m)   Wt 176 lb 6 oz (80 kg)   SpO2 97%   BMI 29.58 kg/m    CC: Chief Complaint  Patient presents with   Annual Exam    Part 2    Subjective:   HPI: Bridget Cummings is a 73 y.o. female presenting on 06/13/2024 for Annual Exam (Part 2)  The patient presents for complete physical and review of chronic health problems. He/She also has the following acute concerns today:  none   Recent ankle fracture.. minimal activity.  The patient saw a LPN or RN for medicare wellness visit. 05/28/2024  Prevention and wellness was reviewed in detail. Note reviewed and important notes copied below.  Hypertension:  Good control on hydrochlorothiazide  25 mg p.o. daily. BP Readings from Last 3 Encounters:  06/13/24 124/80  03/07/24 130/80  02/01/24 (!) 143/75  Using medication without problems or lightheadedness:  none Chest pain with exertion: none Edema: none Short of breath: none Average home BPs: Other issues:  Elevated Cholesterol: Due for reevaluation Lab Results  Component Value Date   CHOL 139 06/01/2023   HDL 46.10 06/01/2023   LDLCALC 75 06/01/2023   LDLDIRECT 140.2 09/30/2013   TRIG 93.0 06/01/2023   CHOLHDL 3 06/01/2023  Using medications without problems: Muscle aches:  Diet compliance: moderate Exercise: good Other complaints:   Hx of breast cancer followed by Dr. Melanee. S/p Arimidex ,   completed.    Diabetes: Due for re-eval. On Metformin  XR 500 mg 3 tablets daily Lab Results  Component Value Date   HGBA1C 8.3 (A) 03/07/2024  Using medications without difficulties: none Hypoglycemic episodes:  Hyperglycemic episodes: Feet problems: none Blood Sugars averaging: not checking eye exam within last year: scheduled next week  She has lost 5 lbs in the last  month. Wt Readings from Last 3 Encounters:  06/13/24 176 lb 6 oz (80 kg)  05/28/24 181 lb (82.1 kg)  03/07/24 181 lb 2 oz (82.2 kg)     Relevant past medical, surgical, family and social history reviewed and updated as indicated. Interim medical history since our last visit reviewed. Allergies and medications reviewed and updated. Outpatient Medications Prior to Visit  Medication Sig Dispense Refill   aspirin 81 MG tablet Take 81 mg by mouth at bedtime.     atorvastatin  (LIPITOR) 20 MG tablet TAKE 1 TABLET BY MOUTH EVERY DAY 90 tablet 0   Calcium  Carb-Cholecalciferol (CALCIUM  + VITAMIN D3 PO) Take 2 tablets by mouth daily.     Lancets (ONETOUCH DELICA PLUS LANCET33G) MISC Apply topically daily.     metFORMIN  (GLUCOPHAGE -XR) 500 MG 24 hr tablet Take 3 tablets (1,500 mg total) by mouth daily with breakfast. 270 tablet 3   Blood Glucose Monitoring Suppl (ONETOUCH VERIO REFLECT) w/Device KIT USE TO CHECK BLOOD SUGAR AS DIRECTED.     fluocinonide cream (LIDEX) 0.05 % Apply 1 Application topically 2 (two) times daily as needed.     glucose blood (ONETOUCH VERIO) test strip CHECK BLOOD SUGAR DAILY 100 strip 3   hydrochlorothiazide  (HYDRODIURIL ) 25 MG tablet TAKE 1 TABLET (25 MG TOTAL) BY MOUTH DAILY. 90 tablet 0   naproxen (NAPROSYN) 500 MG tablet Take 500 mg by mouth 2 (two) times daily. (Patient not  taking: Reported on 05/28/2024)     No facility-administered medications prior to visit.     Per HPI unless specifically indicated in ROS section below Review of Systems  Constitutional:  Negative for fatigue and fever.  HENT:  Negative for congestion.   Eyes:  Negative for pain.  Respiratory:  Negative for cough and shortness of breath.   Cardiovascular:  Negative for chest pain, palpitations and leg swelling.  Gastrointestinal:  Negative for abdominal pain.  Genitourinary:  Negative for dysuria and vaginal bleeding.  Musculoskeletal:  Negative for back pain.  Neurological:  Negative for  syncope, light-headedness and headaches.  Psychiatric/Behavioral:  Negative for dysphoric mood.    Objective:  BP 124/80   Pulse 90   Temp 99.3 F (37.4 C) (Temporal)   Ht 5' 4.75 (1.645 m)   Wt 176 lb 6 oz (80 kg)   SpO2 97%   BMI 29.58 kg/m   Wt Readings from Last 3 Encounters:  06/13/24 176 lb 6 oz (80 kg)  05/28/24 181 lb (82.1 kg)  03/07/24 181 lb 2 oz (82.2 kg)      Physical Exam Vitals and nursing note reviewed.  Constitutional:      General: She is not in acute distress.    Appearance: Normal appearance. She is well-developed. She is not ill-appearing or toxic-appearing.  HENT:     Head: Normocephalic.     Right Ear: Hearing, tympanic membrane, ear canal and external ear normal.     Left Ear: Hearing, tympanic membrane, ear canal and external ear normal.     Nose: Nose normal.  Eyes:     General: Lids are normal. Lids are everted, no foreign bodies appreciated.     Conjunctiva/sclera: Conjunctivae normal.     Pupils: Pupils are equal, round, and reactive to light.  Neck:     Thyroid : No thyroid  mass or thyromegaly.     Vascular: No carotid bruit.     Trachea: Trachea normal.  Cardiovascular:     Rate and Rhythm: Normal rate and regular rhythm.     Heart sounds: Normal heart sounds, S1 normal and S2 normal. No murmur heard.    No gallop.  Pulmonary:     Effort: Pulmonary effort is normal. No respiratory distress.     Breath sounds: Normal breath sounds. No wheezing, rhonchi or rales.  Abdominal:     General: Bowel sounds are normal. There is no distension or abdominal bruit.     Palpations: Abdomen is soft. There is no fluid wave or mass.     Tenderness: There is no abdominal tenderness. There is no guarding or rebound.     Hernia: No hernia is present.  Musculoskeletal:     Cervical back: Normal range of motion and neck supple.     Right ankle: Tenderness present over the medial malleolus. No lateral malleolus, ATF ligament, AITF ligament, posterior TF  ligament, base of 5th metatarsal or proximal fibula tenderness. Normal range of motion. Anterior drawer test negative. Normal pulse.     Right Achilles Tendon: Normal.     Left ankle:     Left Achilles Tendon: Normal.  Lymphadenopathy:     Cervical: No cervical adenopathy.  Skin:    General: Skin is warm and dry.     Findings: No rash.  Neurological:     Mental Status: She is alert.     Cranial Nerves: No cranial nerve deficit.     Sensory: No sensory deficit.  Psychiatric:  Mood and Affect: Mood is not anxious or depressed.        Speech: Speech normal.        Behavior: Behavior normal. Behavior is cooperative.        Judgment: Judgment normal.       Results for orders placed or performed in visit on 03/07/24  POCT glycosylated hemoglobin (Hb A1C)   Collection Time: 03/07/24 11:16 AM  Result Value Ref Range   Hemoglobin A1C 8.3 (A) 4.0 - 5.6 %   HbA1c POC (<> result, manual entry)     HbA1c, POC (prediabetic range)     HbA1c, POC (controlled diabetic range)       COVID 19 screen:  No recent travel or known exposure to COVID19 The patient denies respiratory symptoms of COVID 19 at this time. The importance of social distancing was discussed today.   Assessment and Plan   The patient's preventative maintenance and recommended screening tests for an annual wellness exam were reviewed in full today. Brought up to date unless services declined.  Counselled on the importance of diet, exercise, and its role in overall health and mortality. The patient's FH and SH was reviewed, including their home life, tobacco status, and drug and alcohol status.   Vaccines: uptodate. S/P  COVID vaccine, Shingrix, she just got Td 05/25/2022 screening mammogram...per onc,  hx of breast cancer neg 10/2022 PAP/DVE: no ovaries due to hx of breast cancer, has uterus.  last done 2017.. Pap no longer indicated. No DVE indicated, asymptomatic. Colonoscopy 11/2019 Dr. Luis, repeat in 10 years.   She has had 3 hemorrhoid banding. DEXA;  2019 nml, 10/2021 T -2.3, 01/2023 T -2.3.SABRA  Refused HIV testing.  Hep C: neg  Nonsmoker, ETOH wine rarely  Problem List Items Addressed This Visit     History of breast cancer    She has completed arimidex       Hyperlipidemia associated with type 2 diabetes mellitus (HCC)   Due for re-evaluation on 40 mg daily pravastatin .       Hypertension associated with diabetes (HCC)   Stable, chronic.  Continue current medication.   HCTZ 25 mg daily.      Type 2 diabetes mellitus with other circulatory complications (HCC)   Due for re-eval.  She will work on low carb diet and continue regular aerobic exercise.  Now on  Metformin  XR 500 mg 3 tablets daily. Discussed other meds such as GLP1  but she is not interested.       Relevant Orders   Hemoglobin A1c   Comprehensive metabolic panel with GFR   Microalbumin / creatinine urine ratio   Other Visit Diagnoses       Routine general medical examination at a health care facility    -  Primary       Greig Ring, MD

## 2024-06-13 NOTE — Assessment & Plan Note (Signed)
Due for re-evaluation on 40 mg daily pravastatin.  

## 2024-06-17 LAB — HM DIABETES EYE EXAM

## 2024-08-05 ENCOUNTER — Other Ambulatory Visit: Payer: Self-pay | Admitting: Family Medicine

## 2024-08-05 ENCOUNTER — Other Ambulatory Visit: Payer: Self-pay | Admitting: *Deleted

## 2024-08-05 MED ORDER — GLIPIZIDE ER 10 MG PO TB24: 10.0000 mg | ORAL_TABLET | Freq: Every day | ORAL | 11 refills | Status: AC

## 2024-08-05 MED ORDER — METFORMIN HCL ER 500 MG PO TB24: 500.0000 mg | ORAL_TABLET | Freq: Two times a day (BID) | ORAL | 1 refills | Status: AC

## 2024-08-05 NOTE — Telephone Encounter (Signed)
 Bridget Cummings notified as instructed by telephone.  She states she still had diarrhea on the lower dose too.  She would like to go back to the 2 tablets daily and add the glipizide .  She scheduled 12/2024 for 6 month follow up.  Is that okay or will she need to be seen sooner.  Please advise.

## 2024-08-05 NOTE — Telephone Encounter (Signed)
 Spoke with Bridget Cummings.  She states she is having a lot of digestive issues (diarrhea) on the Metformin .  She states someone recommended Glipizide  to her husband when he was on Metformin  and having the same issues Once he switched to Glipizide  it resolved his issues.

## 2024-08-05 NOTE — Telephone Encounter (Signed)
 She needs to come in  October 11 for repeat POC A1c. She can decrease metformin  to 500 mg twice daily and I will send in a prescription for Glucotrol  XL (glipizide ) make sure she knows to take this within half an hour of eating given it can drop the blood sugar rapidly.  Biggest side effect people typically have can be hypoglycemia. If she is still having diarrhea with this regimen and if her A1c is not at goal we can consider stopping metformin  and changing to a alternate medication.

## 2024-08-05 NOTE — Telephone Encounter (Signed)
 Copied from CRM #[id number]. Topic: Clinical - Medication Question >> Aug 05, 2024 10:49 AM Chiquita SQUIBB wrote: Reason for CRM: Patient is calling in asking if the metFORMIN  (GLUCOPHAGE -XR) 500 MG 24 hr tablet [[id number]] can be changed to Gilpizide medication. Please advise the patient.

## 2024-08-06 NOTE — Telephone Encounter (Signed)
 Bridget Cummings notified as instructed by telephone.  Patient states understanding.  She will call back to schedule appointment for October.

## 2024-08-06 NOTE — Telephone Encounter (Signed)
 Left message for Mrs. Stryker to return call to office.  I let her know I would also send her Dr. Sherrel plan to her MyChart if that would be easier to look at rather than calling the office back.

## 2024-08-07 NOTE — Telephone Encounter (Unsigned)
 Copied from CRM [id number]. Topic: Clinical - Prescription Issue >> Aug 07, 2024 10:16 AM Suzen RAMAN wrote: Reason for CRM: Patient called to inform Dr. Avelina nurse that she was only able to pick up the glipiZIDE  (GLUCOTROL  XL) 10 MG 24 hr tablet and not the metformin . Pharmacy advised patient to contact provider. Patient advised that per chart details pharmacy confirmed receipt of medication 08/05/24 at 5:30 pm. Patient will contact pharmacy again for an update.

## 2024-08-12 DIAGNOSIS — S52502D Unspecified fracture of the lower end of left radius, subsequent encounter for closed fracture with routine healing: Secondary | ICD-10-CM | POA: Diagnosis not present

## 2024-09-01 ENCOUNTER — Other Ambulatory Visit: Payer: Self-pay | Admitting: Family Medicine

## 2024-09-01 NOTE — Telephone Encounter (Signed)
 Left message for Ms. Shaver to return call to office.  Need to confirm if she is taking the hydrochlorothiazide  25 mg.

## 2024-09-05 ENCOUNTER — Ambulatory Visit: Admitting: Family Medicine

## 2024-09-19 ENCOUNTER — Ambulatory Visit: Admitting: Family Medicine

## 2024-09-19 ENCOUNTER — Encounter: Payer: Self-pay | Admitting: Family Medicine

## 2024-09-19 VITALS — BP 110/74 | HR 81 | Temp 98.2°F | Ht 64.75 in | Wt 179.5 lb

## 2024-09-19 DIAGNOSIS — Z7984 Long term (current) use of oral hypoglycemic drugs: Secondary | ICD-10-CM | POA: Diagnosis not present

## 2024-09-19 DIAGNOSIS — E1159 Type 2 diabetes mellitus with other circulatory complications: Secondary | ICD-10-CM

## 2024-09-19 LAB — POCT GLYCOSYLATED HEMOGLOBIN (HGB A1C): Hemoglobin A1C: 7.6 % — AB (ref 4.0–5.6)

## 2024-09-19 NOTE — Assessment & Plan Note (Signed)
 Chronic, improving control.  Did not tolerate higher dose of metformin .  Continue working on healthy low carb diet, exercise nad weight management.    Metformin  XR 500 mg 2 tablets daily  Glucotrol  XL 10 mg daily

## 2024-09-19 NOTE — Progress Notes (Signed)
 Patient ID: Bridget Cummings, female    DOB: 1951-12-02, 73 y.o.   MRN: [id number]  This visit was conducted in person.  BP 110/74   Pulse 81   Temp 98.2 F (36.8 C) (Temporal)   Ht 5' 4.75 (1.645 m)   Wt 179 lb 8 oz (81.4 kg)   SpO2 95%   BMI 30.10 kg/m    CC:  Chief Complaint  Patient presents with   Diabetes    Subjective:   HPI: Bridget Cummings is a 73 y.o. female presenting on 09/19/2024 for Diabetes   Diabetes: At last OV recommended increasing to  metformin  XR 500 mg 2 tablets  in AMa nd 2 tabs in PM... she had some stomach upset.   Now on metformin  XR 500 mg daily and glipizide  10 XL  Lab Results  Component Value Date   HGBA1C 7.6 (A) 09/19/2024  Using medications without difficulties:  some intermittent diarrhea, wishes to decrease dose Hypoglycemic episodes: Hyperglycemic episodes: Feet problems: none Blood Sugars averaging: none eye exam within last year:   yes   Hypertension:  Well-controlled on hydrochlorothiazide  25 mg p.o. daily BP Readings from Last 3 Encounters:  09/19/24 110/74  06/13/24 124/80  03/07/24 130/80  Using medication without problems or lightheadedness:  none Chest pain with exertion: none Edema: none Short of breath:none Average home BPs: 120/70s Other issues:  Off  anastrozole  in 01/2024  Relevant past medical, surgical, family and social history reviewed and updated as indicated. Interim medical history since our last visit reviewed. Allergies and medications reviewed and updated. Outpatient Medications Prior to Visit  Medication Sig Dispense Refill   aspirin 81 MG tablet Take 81 mg by mouth at bedtime.     atorvastatin  (LIPITOR) 20 MG tablet TAKE 1 TABLET BY MOUTH EVERY DAY 90 tablet 3   Calcium  Carb-Cholecalciferol (CALCIUM  + VITAMIN D3 PO) Take 2 tablets by mouth daily.     glipiZIDE  (GLUCOTROL  XL) 10 MG 24 hr tablet Take 1 tablet (10 mg total) by mouth daily with breakfast. 30 tablet 11   hydrochlorothiazide   (HYDRODIURIL ) 25 MG tablet Take 25 mg by mouth daily.     Lancets (ONETOUCH DELICA PLUS LANCET33G) MISC Apply topically daily.     metFORMIN  (GLUCOPHAGE -XR) 500 MG 24 hr tablet Take 1 tablet (500 mg total) by mouth 2 (two) times daily with a meal. 180 tablet 1   No facility-administered medications prior to visit.     Per HPI unless specifically indicated in ROS section below Review of Systems  Constitutional:  Negative for fatigue and fever.  HENT:  Negative for congestion.   Eyes:  Negative for pain.  Respiratory:  Negative for cough and shortness of breath.   Cardiovascular:  Negative for chest pain, palpitations and leg swelling.  Gastrointestinal:  Negative for abdominal pain.  Genitourinary:  Negative for dysuria and vaginal bleeding.  Musculoskeletal:  Negative for back pain.  Neurological:  Negative for syncope, light-headedness and headaches.  Psychiatric/Behavioral:  Negative for dysphoric mood.    Objective:  BP 110/74   Pulse 81   Temp 98.2 F (36.8 C) (Temporal)   Ht 5' 4.75 (1.645 m)   Wt 179 lb 8 oz (81.4 kg)   SpO2 95%   BMI 30.10 kg/m   Wt Readings from Last 3 Encounters:  09/19/24 179 lb 8 oz (81.4 kg)  06/13/24 176 lb 6 oz (80 kg)  05/28/24 181 lb (82.1 kg)      Physical Exam Constitutional:  General: She is not in acute distress.    Appearance: Normal appearance. She is well-developed. She is not ill-appearing or toxic-appearing.  HENT:     Head: Normocephalic.     Right Ear: Hearing, tympanic membrane, ear canal and external ear normal. Tympanic membrane is not erythematous, retracted or bulging.     Left Ear: Hearing, tympanic membrane, ear canal and external ear normal. Tympanic membrane is not erythematous, retracted or bulging.     Nose: No mucosal edema or rhinorrhea.     Right Sinus: No maxillary sinus tenderness or frontal sinus tenderness.     Left Sinus: No maxillary sinus tenderness or frontal sinus tenderness.     Mouth/Throat:      Pharynx: Uvula midline.  Eyes:     General: Lids are normal. Lids are everted, no foreign bodies appreciated.     Conjunctiva/sclera: Conjunctivae normal.     Pupils: Pupils are equal, round, and reactive to light.  Neck:     Thyroid : No thyroid  mass or thyromegaly.     Vascular: No carotid bruit.     Trachea: Trachea normal.  Cardiovascular:     Rate and Rhythm: Normal rate and regular rhythm.     Pulses: Normal pulses.     Heart sounds: Normal heart sounds, S1 normal and S2 normal. No murmur heard.    No friction rub. No gallop.  Pulmonary:     Effort: Pulmonary effort is normal. No tachypnea or respiratory distress.     Breath sounds: Normal breath sounds. No decreased breath sounds, wheezing, rhonchi or rales.  Abdominal:     General: Bowel sounds are normal.     Palpations: Abdomen is soft.     Tenderness: There is no abdominal tenderness.  Musculoskeletal:     Cervical back: Normal range of motion and neck supple.  Skin:    General: Skin is warm and dry.     Findings: No rash.  Neurological:     Mental Status: She is alert.  Psychiatric:        Mood and Affect: Mood is not anxious or depressed.        Speech: Speech normal.        Behavior: Behavior normal. Behavior is cooperative.        Thought Content: Thought content normal.        Judgment: Judgment normal.        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  Did  not tolerate higher dose of metformin .  Continue working on healthy low carb diet, exercise nad weight management.    Metformin  XR 500 mg 2 tablets daily  Glucotrol  XL 10 mg daily  Orders: -     POCT glycosylated hemoglobin (Hb A1C)    No follow-ups on file.   Greig Ring, MD

## 2024-09-22 ENCOUNTER — Other Ambulatory Visit: Payer: Self-pay | Admitting: Family Medicine

## 2024-09-22 DIAGNOSIS — Z1231 Encounter for screening mammogram for malignant neoplasm of breast: Secondary | ICD-10-CM

## 2024-10-23 ENCOUNTER — Ambulatory Visit
Admission: RE | Admit: 2024-10-23 | Discharge: 2024-10-23 | Disposition: A | Discharge status: Home / Self Care | Source: Ambulatory Visit | Attending: Family Medicine | Admitting: Family Medicine

## 2024-10-23 DIAGNOSIS — Z1231 Encounter for screening mammogram for malignant neoplasm of breast: Secondary | ICD-10-CM | POA: Diagnosis not present

## 2024-10-29 ENCOUNTER — Ambulatory Visit: Payer: Self-pay | Admitting: Family Medicine

## 2024-12-19 ENCOUNTER — Ambulatory Visit: Payer: Self-pay | Admitting: Family Medicine

## 2024-12-19 ENCOUNTER — Encounter: Payer: Self-pay | Admitting: Family Medicine

## 2024-12-19 ENCOUNTER — Ambulatory Visit: Admitting: Family Medicine

## 2024-12-19 VITALS — BP 130/90 | HR 83 | Temp 98.3°F | Ht 64.75 in | Wt 178.0 lb

## 2024-12-19 DIAGNOSIS — Z7984 Long term (current) use of oral hypoglycemic drugs: Secondary | ICD-10-CM | POA: Diagnosis not present

## 2024-12-19 DIAGNOSIS — E1159 Type 2 diabetes mellitus with other circulatory complications: Secondary | ICD-10-CM

## 2024-12-19 DIAGNOSIS — I152 Hypertension secondary to endocrine disorders: Secondary | ICD-10-CM | POA: Diagnosis not present

## 2024-12-19 LAB — POCT GLYCOSYLATED HEMOGLOBIN (HGB A1C): Hemoglobin A1C: 6.6 % — AB (ref 4.0–5.6)

## 2024-12-19 LAB — MICROALBUMIN / CREATININE URINE RATIO
Creatinine,U: 351.9 mg/dL
Microalb Creat Ratio: 4.4 mg/g (ref 0.0–30.0)
Microalb, Ur: 1.5 mg/dL (ref 0.7–1.9)

## 2024-12-19 LAB — HM DIABETES FOOT EXAM

## 2024-12-19 NOTE — Progress Notes (Signed)
 "   Patient ID: Bridget Cummings, female    DOB: Jan 25, 1951, 74 y.o.   MRN: [id number]  This visit was conducted in person.  BP (!) 130/90   Pulse 83   Temp 98.3 F (36.8 C) (Oral)   Ht 5' 4.75 (1.645 m)   Wt 178 lb (80.7 kg)   SpO2 98%   BMI 29.85 kg/m    CC:  No chief complaint on file.   Subjective:   HPI: Bridget Cummings is a 74 y.o. female presenting on 12/19/2024 for No chief complaint on file.   Diabetes:  Improved control!   Now on metformin  XR 500 mg daily and glipizide  10 XL  Stomach upset with higher dose of metformin . Lab Results  Component Value Date   HGBA1C 6.6 (A) 12/19/2024  Using medications without difficulties:   none Hypoglycemic episodes: Hyperglycemic episodes: Feet problems: none Blood Sugars averaging: none eye exam within last year:   yes   Hypertension:  Well-controlled on hydrochlorothiazide  25 mg p.o. daily.. hasn't taken yet today. BP Readings from Last 3 Encounters:  12/19/24 (!) 130/90  09/19/24 110/74  06/13/24 124/80  Using medication without problems or lightheadedness:  none Chest pain with exertion: none Edema: none Short of breath:none Average home BPs: 120/70s Other issues:  Off  anastrozole  in 01/2024   Walking more in last few months.    Wt Readings from Last 3 Encounters:  12/19/24 178 lb (80.7 kg)  09/19/24 179 lb 8 oz (81.4 kg)  06/13/24 176 lb 6 oz (80 kg)     Relevant past medical, surgical, family and social history reviewed and updated as indicated. Interim medical history since our last visit reviewed. Allergies and medications reviewed and updated. Outpatient Medications Prior to Visit  Medication Sig Dispense Refill   aspirin 81 MG tablet Take 81 mg by mouth at bedtime.     atorvastatin  (LIPITOR) 20 MG tablet TAKE 1 TABLET BY MOUTH EVERY DAY 90 tablet 3   Calcium  Carb-Cholecalciferol (CALCIUM  + VITAMIN D3 PO) Take 2 tablets by mouth daily.     glipiZIDE  (GLUCOTROL  XL) 10 MG 24 hr tablet Take  1 tablet (10 mg total) by mouth daily with breakfast. 30 tablet 11   hydrochlorothiazide  (HYDRODIURIL ) 25 MG tablet Take 25 mg by mouth daily.     Lancets (ONETOUCH DELICA PLUS LANCET33G) MISC Apply topically daily.     metFORMIN  (GLUCOPHAGE -XR) 500 MG 24 hr tablet Take 1 tablet (500 mg total) by mouth 2 (two) times daily with a meal. 180 tablet 1   No facility-administered medications prior to visit.     Per HPI unless specifically indicated in ROS section below Review of Systems  Constitutional:  Negative for fatigue and fever.  HENT:  Negative for congestion.   Eyes:  Negative for pain.  Respiratory:  Negative for cough and shortness of breath.   Cardiovascular:  Negative for chest pain, palpitations and leg swelling.  Gastrointestinal:  Negative for abdominal pain.  Genitourinary:  Negative for dysuria and vaginal bleeding.  Musculoskeletal:  Negative for back pain.  Neurological:  Negative for syncope, light-headedness and headaches.  Psychiatric/Behavioral:  Negative for dysphoric mood.    Objective:  BP (!) 130/90   Pulse 83   Temp 98.3 F (36.8 C) (Oral)   Ht 5' 4.75 (1.645 m)   Wt 178 lb (80.7 kg)   SpO2 98%   BMI 29.85 kg/m   Wt Readings from Last 3 Encounters:  12/19/24 178 lb (80.7 kg)  09/19/24 179 lb 8 oz (81.4 kg)  06/13/24 176 lb 6 oz (80 kg)      Physical Exam Constitutional:      General: She is not in acute distress.    Appearance: Normal appearance. She is well-developed. She is not ill-appearing or toxic-appearing.  HENT:     Head: Normocephalic.     Right Ear: Hearing, tympanic membrane, ear canal and external ear normal. Tympanic membrane is not erythematous, retracted or bulging.     Left Ear: Hearing, tympanic membrane, ear canal and external ear normal. Tympanic membrane is not erythematous, retracted or bulging.     Nose: No mucosal edema or rhinorrhea.     Right Sinus: No maxillary sinus tenderness or frontal sinus tenderness.     Left  Sinus: No maxillary sinus tenderness or frontal sinus tenderness.     Mouth/Throat:     Pharynx: Uvula midline.  Eyes:     General: Lids are normal. Lids are everted, no foreign bodies appreciated.     Conjunctiva/sclera: Conjunctivae normal.     Pupils: Pupils are equal, round, and reactive to light.  Neck:     Thyroid : No thyroid  mass or thyromegaly.     Vascular: No carotid bruit.     Trachea: Trachea normal.  Cardiovascular:     Rate and Rhythm: Normal rate and regular rhythm.     Pulses: Normal pulses.     Heart sounds: Normal heart sounds, S1 normal and S2 normal. No murmur heard.    No friction rub. No gallop.  Pulmonary:     Effort: Pulmonary effort is normal. No tachypnea or respiratory distress.     Breath sounds: Normal breath sounds. No decreased breath sounds, wheezing, rhonchi or rales.  Abdominal:     General: Bowel sounds are normal.     Palpations: Abdomen is soft.     Tenderness: There is no abdominal tenderness.  Musculoskeletal:     Cervical back: Normal range of motion and neck supple.  Skin:    General: Skin is warm and dry.     Findings: No rash.  Neurological:     Mental Status: She is alert.  Psychiatric:        Mood and Affect: Mood is not anxious or depressed.        Speech: Speech normal.        Behavior: Behavior normal. Behavior is cooperative.        Thought Content: Thought content normal.        Judgment: Judgment normal.   Diabetic foot exam: Normal inspection No skin breakdown No calluses  Normal DP pulses Normal sensation to light touch and monofilament Nails normal      Results for orders placed or performed in visit on 12/19/24  HM DIABETES FOOT EXAM   Collection Time: 12/19/24 12:00 AM  Result Value Ref Range   HM Diabetic Foot Exam done   POCT glycosylated hemoglobin (Hb A1C)   Collection Time: 12/19/24 11:09 AM  Result Value Ref Range   Hemoglobin A1C 6.6 (A) 4.0 - 5.6 %   HbA1c POC (<> result, manual entry)     HbA1c,  POC (prediabetic range)     HbA1c, POC (controlled diabetic range)      Assessment and Plan  Type 2 diabetes mellitus with other circulatory complications (HCC) Assessment & Plan:  Chronic, improved control.  Did not tolerate higher dose of metformin .  Continue working on healthy low carb diet, exercise nad weight management. Due for  foot exam and microalbumin..  Done.    Metformin  XR 500 mg 2 tablets daily  Glucotrol  XL 10 mg daily  Orders: -     POCT glycosylated hemoglobin (Hb A1C) -     Microalbumin / creatinine urine ratio  Hypertension associated with diabetes (HCC) Assessment & Plan: Borderline control in office today but well-controlled at home, chronic.  Continue current medication.   HCTZ 25 mg daily.     Return in about 6 months (around 06/18/2025) for phone AMW,  fasting labs then CPE with me.   Greig Ring, MD  "

## 2024-12-19 NOTE — Assessment & Plan Note (Signed)
 Borderline control in office today but well-controlled at home, chronic.  Continue current medication.   HCTZ 25 mg daily.

## 2024-12-19 NOTE — Assessment & Plan Note (Addendum)
"   Chronic, improved control.  Did not tolerate higher dose of metformin .  Continue working on healthy low carb diet, exercise nad weight management. Due for foot exam and microalbumin..  Done.    Metformin  XR 500 mg 2 tablets daily  Glucotrol  XL 10 mg daily "

## 2024-12-23 ENCOUNTER — Other Ambulatory Visit: Payer: Self-pay | Admitting: Family Medicine

## 2024-12-23 LAB — OPHTHALMOLOGY REPORT-SCANNED

## 2024-12-23 MED ORDER — HYDROCHLOROTHIAZIDE 25 MG PO TABS: 25.0000 mg | ORAL_TABLET | Freq: Every day | ORAL | 1 refills | Status: AC

## 2024-12-23 NOTE — Telephone Encounter (Signed)
 Copied from CRM [id number]. Topic: Clinical - Medication Refill >> Dec 23, 2024 12:57 PM Aisha D wrote: Medication: hydrochlorothiazide  (HYDRODIURIL ) 25 MG tablet  Has the patient contacted their pharmacy? Yes (Agent: If no, request that the patient contact the pharmacy for the refill. If patient does not wish to contact the pharmacy document the reason why and proceed with request.) (Agent: If yes, when and what did the pharmacy advise?)  This is the patient's preferred pharmacy:  [id number] [place], [place] - [address] [place] [address] Phone: [phone] Fax: [phone]   Is this the correct pharmacy for this prescription? Yes If no, delete pharmacy and type the correct one.   Has the prescription been filled recently? No  Is the patient out of the medication? No  Has the patient been seen for an appointment in the last year OR does the patient have an upcoming appointment? Yes  Can we respond through MyChart? No  Agent: Please be advised that Rx refills may take up to 3 business days. We ask that you follow-up with your pharmacy.

## 2025-05-29 ENCOUNTER — Ambulatory Visit

## 2025-06-19 ENCOUNTER — Encounter: Admitting: Family Medicine
